# Patient Record
Sex: Male | Born: 1945 | Race: White | Hispanic: No | Marital: Married | Smoking: Current every day smoker
Health system: Southern US, Community
[De-identification: ages and names within clinical notes are randomized; demographics above are authoritative.]

## PROBLEM LIST (undated history)

## (undated) DIAGNOSIS — J189 Pneumonia, unspecified organism: Secondary | ICD-10-CM

## (undated) DIAGNOSIS — I739 Peripheral vascular disease, unspecified: Secondary | ICD-10-CM

## (undated) DIAGNOSIS — E785 Hyperlipidemia, unspecified: Secondary | ICD-10-CM

## (undated) DIAGNOSIS — E1159 Type 2 diabetes mellitus with other circulatory complications: Secondary | ICD-10-CM

## (undated) DIAGNOSIS — Z77098 Contact with and (suspected) exposure to other hazardous, chiefly nonmedicinal, chemicals: Secondary | ICD-10-CM

## (undated) DIAGNOSIS — G4700 Insomnia, unspecified: Secondary | ICD-10-CM

## (undated) DIAGNOSIS — F528 Other sexual dysfunction not due to a substance or known physiological condition: Secondary | ICD-10-CM

## (undated) DIAGNOSIS — F419 Anxiety disorder, unspecified: Secondary | ICD-10-CM

## (undated) DIAGNOSIS — I779 Disorder of arteries and arterioles, unspecified: Secondary | ICD-10-CM

## (undated) DIAGNOSIS — Z7739 Contact with and (suspected) exposure to other war theater: Secondary | ICD-10-CM

## (undated) DIAGNOSIS — I10 Essential (primary) hypertension: Secondary | ICD-10-CM

## (undated) DIAGNOSIS — I5022 Chronic systolic (congestive) heart failure: Secondary | ICD-10-CM

## (undated) DIAGNOSIS — I447 Left bundle-branch block, unspecified: Secondary | ICD-10-CM

## (undated) DIAGNOSIS — Z86010 Personal history of colonic polyps: Secondary | ICD-10-CM

## (undated) DIAGNOSIS — I25119 Atherosclerotic heart disease of native coronary artery with unspecified angina pectoris: Secondary | ICD-10-CM

## (undated) DIAGNOSIS — F4310 Post-traumatic stress disorder, unspecified: Secondary | ICD-10-CM

## (undated) HISTORY — DX: Personal history of colonic polyps: Z86.010

## (undated) HISTORY — PX: CAROTID ENDARTERECTOMY: SUR193

## (undated) HISTORY — DX: Other sexual dysfunction not due to a substance or known physiological condition: F52.8

## (undated) HISTORY — PX: ABDOMINAL AORTIC ANEURYSM REPAIR: SUR1152

## (undated) HISTORY — DX: Post-traumatic stress disorder, unspecified: F43.10

---

## 1998-12-08 ENCOUNTER — Encounter: Payer: Self-pay | Admitting: Internal Medicine

## 1998-12-08 ENCOUNTER — Ambulatory Visit (HOSPITAL_COMMUNITY): Admission: RE | Admit: 1998-12-08 | Discharge: 1998-12-08 | Payer: Self-pay | Admitting: Internal Medicine

## 2004-10-27 ENCOUNTER — Ambulatory Visit: Payer: Self-pay | Admitting: Internal Medicine

## 2005-11-22 HISTORY — PX: CORONARY STENT PLACEMENT: SHX1402

## 2005-12-17 ENCOUNTER — Ambulatory Visit: Payer: Self-pay | Admitting: Internal Medicine

## 2007-01-26 ENCOUNTER — Ambulatory Visit: Payer: Self-pay | Admitting: Internal Medicine

## 2007-08-15 ENCOUNTER — Encounter: Payer: Self-pay | Admitting: *Deleted

## 2007-08-15 DIAGNOSIS — F528 Other sexual dysfunction not due to a substance or known physiological condition: Secondary | ICD-10-CM | POA: Insufficient documentation

## 2007-08-15 DIAGNOSIS — I10 Essential (primary) hypertension: Secondary | ICD-10-CM | POA: Insufficient documentation

## 2007-08-15 DIAGNOSIS — Z9189 Other specified personal risk factors, not elsewhere classified: Secondary | ICD-10-CM

## 2007-08-15 HISTORY — DX: Other sexual dysfunction not due to a substance or known physiological condition: F52.8

## 2007-11-23 HISTORY — PX: PROSTATE BIOPSY: SHX241

## 2008-02-23 ENCOUNTER — Ambulatory Visit: Payer: Self-pay | Admitting: Internal Medicine

## 2008-02-23 DIAGNOSIS — I739 Peripheral vascular disease, unspecified: Secondary | ICD-10-CM | POA: Insufficient documentation

## 2008-02-23 DIAGNOSIS — Q245 Malformation of coronary vessels: Secondary | ICD-10-CM | POA: Insufficient documentation

## 2008-02-23 DIAGNOSIS — G4700 Insomnia, unspecified: Secondary | ICD-10-CM | POA: Insufficient documentation

## 2008-02-23 DIAGNOSIS — E785 Hyperlipidemia, unspecified: Secondary | ICD-10-CM

## 2008-02-23 DIAGNOSIS — I25118 Atherosclerotic heart disease of native coronary artery with other forms of angina pectoris: Secondary | ICD-10-CM | POA: Insufficient documentation

## 2008-02-23 DIAGNOSIS — I2510 Atherosclerotic heart disease of native coronary artery without angina pectoris: Secondary | ICD-10-CM

## 2008-02-23 DIAGNOSIS — I25119 Atherosclerotic heart disease of native coronary artery with unspecified angina pectoris: Secondary | ICD-10-CM

## 2008-02-23 HISTORY — DX: Atherosclerotic heart disease of native coronary artery with unspecified angina pectoris: I25.119

## 2009-02-28 ENCOUNTER — Ambulatory Visit: Payer: Self-pay | Admitting: Internal Medicine

## 2009-08-19 ENCOUNTER — Telehealth: Payer: Self-pay | Admitting: Internal Medicine

## 2009-08-22 ENCOUNTER — Ambulatory Visit: Payer: Self-pay | Admitting: Internal Medicine

## 2009-08-23 DIAGNOSIS — F411 Generalized anxiety disorder: Secondary | ICD-10-CM | POA: Insufficient documentation

## 2010-02-09 ENCOUNTER — Telehealth: Payer: Self-pay | Admitting: Internal Medicine

## 2010-07-30 ENCOUNTER — Ambulatory Visit: Payer: Self-pay | Admitting: Internal Medicine

## 2010-07-30 DIAGNOSIS — F4310 Post-traumatic stress disorder, unspecified: Secondary | ICD-10-CM | POA: Insufficient documentation

## 2010-07-30 HISTORY — DX: Post-traumatic stress disorder, unspecified: F43.10

## 2010-08-28 ENCOUNTER — Encounter: Payer: Self-pay | Admitting: Internal Medicine

## 2010-12-22 NOTE — Assessment & Plan Note (Signed)
Summary: f/u appt/#/cd   Vital Signs:  Patient profile:   65 year old male Height:      72 inches Weight:      179 pounds BMI:     24.36 O2 Sat:      96 % on Room air Temp:     98.2 degrees F oral Pulse rate:   66 / minute BP sitting:   130 / 72  (left arm) Cuff size:   regular  Vitals Entered By: Zella Ball Ewing CMA Duncan Dull) (July 30, 2010 1:53 PM)  O2 Flow:  Room air  Preventive Care Screening  Last Flu Shot:    Date:  07/27/2010    Results:  given   CC: followup/RE   CC:  followup/RE.  History of Present Illness: here to f/u;  has been approved for disablity, still working until he gets his first check;  needs med refills;  dneies worsening depressive symptoms or panic. still with chronic insomnia and mult meds have not worked for him in the past, cannot get the lorazepam at the [place], so is here for that if possible as this has worked in the past.  Pt denies CP, worsening sob, doe, wheezing, orthopnea, pnd, worsening LE edema, palps, dizziness or syncope  Pt denies new neuro symptoms such as headache, facial or extremity weakness  No fever, wt loss, night sweats, loss of appetite or other constitutional symptoms   Preventive Screening-Counseling & Management      Drug Use:  no.    Problems Prior to Update: 1)  Ptsd  (ICD-309.81) 2)  Anxiety  (ICD-300.00) 3)  Hyperlipidemia  (ICD-272.4) 4)  Peripheral Vascular Disease  (ICD-443.9) 5)  Coronary Artery Disease  (ICD-414.00) 6)  Insomnia-sleep Disorder-unspec  (ICD-780.52) 7)  Hypertension  (ICD-401.9) 8)  Erectile Dysfunction  (ICD-302.72) 9)  Insomnia, Hx of  (ICD-V15.89)  Medications Prior to Update: 1)  Simvastatin 20 Mg Tabs (Simvastatin) .Marland Kitchen.. 1po Once Daily 2)  Metoprolol Tartrate 50 Mg Tabs (Metoprolol Tartrate) .... 1/2  By Mouth Once Daily 3)  Lorazepam 1 Mg  Tabs (Lorazepam) .... 2 By Mouth At Bedtime As Needed 4)  Adult Aspirin Ec Low Strength 81 Mg Tbec (Aspirin) .Marland Kitchen.. 1po Once Daily 5)  Lisinopril 10 Mg  Tabs (Lisinopril) .Marland Kitchen.. 1 By Mouth Once Daily  Current Medications (verified): 1)  Simvastatin 20 Mg Tabs (Simvastatin) .Marland Kitchen.. 1po Once Daily 2)  Metoprolol Tartrate 50 Mg Tabs (Metoprolol Tartrate) .... 1/2  By Mouth Once Daily 3)  Lorazepam 1 Mg  Tabs (Lorazepam) .... 2 By Mouth At Bedtime As Needed 4)  Adult Aspirin Ec Low Strength 81 Mg Tbec (Aspirin) .Marland Kitchen.. 1po Once Daily 5)  Lisinopril 10 Mg Tabs (Lisinopril) .Marland Kitchen.. 1 By Mouth Once Daily  Allergies (verified): 1)  ! 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 Coronary artery disease Peripheral vascular disease - carotid 100 % right, s/p CEA on the left Hyperlipidemia Anxiety PTSD  Past Surgical History: s/p coronary stent 1/07 s/p left CEA - at [facility] in [place] s/p prostate biopsy at [facility] 2009  Social History: Reviewed history from 08/22/2009 and no changes required. 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   Head:  normocephalic and atraumatic.   Eyes:  vision grossly intact, pupils equal, and pupils round.   Ears:  R ear normal and L ear normal.   Nose:  no external deformity and no nasal discharge.   Mouth:  no gingival abnormalities and pharynx pink and moist.   Neck:  supple and no masses.   Lungs:  normal respiratory effort and normal breath sounds.   Heart:  normal rate and regular rhythm.   Extremities:  no edema, no erythema  Psych:  moderately anxious.  not depressed appearing.     Impression & Recommendations:  Problem # 1:   INSOMNIA-SLEEP DISORDER-UNSPEC (ICD-780.52) to cont the lorazepam qhs  Problem # 2:  PTSD (ICD-309.81)  with anxiety - pt plans to f/u at [facility]  Problem # 3:  HYPERTENSION (ICD-401.9)  His updated medication list for this problem includes:    Metoprolol Tartrate 50 Mg Tabs (Metoprolol tartrate) .Marland Kitchen... 1/2  by mouth once daily    Lisinopril 10 Mg Tabs (Lisinopril) .Marland Kitchen... 1 by mouth once daily  BP today: 130/72 Prior BP: 110/70 (08/22/2009) stable overall by hx and exam, ok to continue meds/tx as is   Complete Medication List: 1)  Simvastatin 20 Mg Tabs (Simvastatin) .Marland Kitchen.. 1po once daily 2)  Metoprolol Tartrate 50 Mg Tabs (Metoprolol tartrate) .... 1/2  by mouth once daily 3)  Lorazepam 1 Mg Tabs (Lorazepam) .... 2 by mouth at bedtime as needed 4)  Adult Aspirin Ec Low Strength 81 Mg Tbec (Aspirin) .Marland Kitchen.. 1po once daily 5)  Lisinopril 10 Mg Tabs (Lisinopril) .Marland Kitchen.. 1 by mouth once daily  Patient Instructions: 1)  Continue all previous medications as before this visit 2)  Please schedule a follow-up appointment in 6 months for your "yearly medicare exam" Prescriptions: LORAZEPAM 1 MG  TABS (LORAZEPAM) 2 by mouth at bedtime as needed  #60 x 5   Entered and Authorized by:   Corwin Levins MD   Signed by:   Corwin Levins MD on 07/30/2010   Method used:   Print then Give to Patient   RxID:   [id number]

## 2010-12-22 NOTE — Progress Notes (Signed)
Summary: med refills  Phone Note Refill Request  on February 09, 2010 11:11 AM  Refills Requested: Medication #1:  LORAZEPAM 1 MG  TABS 2 by mouth at bedtime as needed   Dosage confirmed as above?Dosage Confirmed   Notes: [facility] [phone] Initial call taken by: Scharlene Gloss,  February 09, 2010 11:12 AM  Follow-up for Phone Call        faxed. Follow-up by: Lucious Groves,  February 09, 2010 2:22 PM    New/Updated Medications: LORAZEPAM 1 MG  TABS (LORAZEPAM) 2 by mouth at bedtime as needed Prescriptions: LORAZEPAM 1 MG  TABS (LORAZEPAM) 2 by mouth at bedtime as needed  #60 x 5   Entered and Authorized by:   Corwin Levins MD   Signed by:   Corwin Levins MD on 02/09/2010   Method used:   Print then Give to Patient   RxID:   [id number]  done hardcopy to [facility] side B - dahlia  Corwin Levins MD  February 09, 2010 1:21 PM

## 2011-01-19 ENCOUNTER — Telehealth: Payer: Self-pay | Admitting: Internal Medicine

## 2011-01-28 NOTE — Progress Notes (Signed)
Summary: medication refill  Phone Note Refill Request Message from:  Fax from Pharmacy on January 19, 2011 2:20 PM  Refills Requested: Medication #1:  LORAZEPAM 1 MG  TABS 2 by mouth at bedtime as needed   Dosage confirmed as above?Dosage Confirmed   Last Refilled: 07/30/2010   Notes: [facility], [phone] Initial call taken by: Robin Ewing CMA Duncan Dull),  January 19, 2011 2:21 PM  Follow-up for Phone Call        faxed hardcopy to pharmacy Follow-up by: Robin Ewing CMA Duncan Dull),  January 19, 2011 2:47 PM    New/Updated Medications: LORAZEPAM 1 MG  TABS (LORAZEPAM) 2 by mouth at bedtime as needed Prescriptions: LORAZEPAM 1 MG  TABS (LORAZEPAM) 2 by mouth at bedtime as needed  #60 x 5   Entered and Authorized by:   Corwin Levins MD   Signed by:   Corwin Levins MD on 01/19/2011   Method used:   Print then Give to Patient   RxID:   [id number]  done hardcopy to [facility] side B - dahlia Corwin Levins MD  January 19, 2011 2:39 PM

## 2011-01-29 ENCOUNTER — Encounter: Payer: Self-pay | Admitting: Internal Medicine

## 2011-01-29 ENCOUNTER — Encounter (INDEPENDENT_AMBULATORY_CARE_PROVIDER_SITE_OTHER): Payer: Self-pay | Admitting: Internal Medicine

## 2011-01-29 DIAGNOSIS — F411 Generalized anxiety disorder: Secondary | ICD-10-CM

## 2011-01-29 DIAGNOSIS — Z0000 Encounter for general adult medical examination without abnormal findings: Secondary | ICD-10-CM

## 2011-01-29 DIAGNOSIS — Z860100 Personal history of colon polyps, unspecified: Secondary | ICD-10-CM

## 2011-01-29 DIAGNOSIS — E119 Type 2 diabetes mellitus without complications: Secondary | ICD-10-CM

## 2011-01-29 DIAGNOSIS — E1151 Type 2 diabetes mellitus with diabetic peripheral angiopathy without gangrene: Secondary | ICD-10-CM | POA: Insufficient documentation

## 2011-01-29 DIAGNOSIS — E785 Hyperlipidemia, unspecified: Secondary | ICD-10-CM

## 2011-01-29 DIAGNOSIS — I2510 Atherosclerotic heart disease of native coronary artery without angina pectoris: Secondary | ICD-10-CM

## 2011-01-29 DIAGNOSIS — G4700 Insomnia, unspecified: Secondary | ICD-10-CM

## 2011-01-29 HISTORY — DX: Personal history of colon polyps, unspecified: Z86.0100

## 2011-01-29 HISTORY — DX: Personal history of colonic polyps: Z86.010

## 2011-02-02 NOTE — Assessment & Plan Note (Signed)
Summary: YEARLY FU MEDICARE ASKED [facility]  NWS#   Vital Signs:  Patient profile:   65 year old male Height:      72 inches Weight:      178.50 pounds BMI:     24.30 O2 Sat:      99 % on Room air Temp:     98 degrees F oral Pulse rate:   64 / minute BP sitting:   134 / 66  (left arm) Cuff size:   regular  Vitals Entered By: Zella Ball Ewing CMA Duncan Dull) (January 29, 2011 8:34 AM)  O2 Flow:  Room air  CC: Physical/RE   CC:  Physical/RE.  History of Present Illness: here to f/u - overall doing ok; normally has most healthcare at the [facility] for chronic purposes;  sees me primarily for insomnia;  Denies worsening depressive symptoms, suicidal ideation, or panic, though has ongoing anxiety.  [facility]  - as oct 2011 6.8 a1c, and LDL 115 - no med changes per [place] MD and pt wants to cont management with him, knowing todya his goal LDL is < 70; Pt denies CP, worsening sob, doe, wheezing, orthopnea, pnd, worsening LE edema, palps, dizziness or syncope Pt denies new neuro symptoms such as headache, facial or extremity weakness  Pt denies polydipsia, polyuria, or low sugar symptoms such as shakiness improved with eating.  Overall good compliance with meds, trying to follow low chol, DM diet, wt stable, little excercise however Does not check BG's but plans to start soon with the [facility] meter he is to get soon.  No fever, wt loss, night sweats, loss of appetite or other constitutional symptoms Overall good compliance with meds, and good tolerability.  Gets PSA twice per yr at [place] as well - no result here. Had colonscopy - neg per pt, but had "spots" taken off that turned out to be nothing.  Last colonscopy actually done at [facility].   Problems Prior to Update: 1)  Colonic Polyps, Hx of  (ICD-V12.72) 2)  Diabetes Mellitus, Type II  (ICD-250.00) 3)  Preventive Health Care  (ICD-V70.0) 4)  Ptsd  (ICD-309.81) 5)  Anxiety  (ICD-300.00) 6)  Hyperlipidemia  (ICD-272.4) 7)  Peripheral  Vascular Disease  (ICD-443.9) 8)  Coronary Artery Disease  (ICD-414.00) 9)  Insomnia-sleep Disorder-unspec  (ICD-780.52) 10)  Hypertension  (ICD-401.9) 11)  Erectile Dysfunction  (ICD-302.72) 12)  Insomnia, Hx of  (ICD-V15.89)  Medications Prior to Update: 1)  Simvastatin 20 Mg Tabs (Simvastatin) .Marland Kitchen.. 1po Once Daily 2)  Metoprolol Tartrate 50 Mg Tabs (Metoprolol Tartrate) .... 1/2  By Mouth Once Daily 3)  Lorazepam 1 Mg  Tabs (Lorazepam) .... 2 By Mouth At Bedtime As Needed 4)  Adult Aspirin Ec Low Strength 81 Mg Tbec (Aspirin) .Marland Kitchen.. 1po Once Daily 5)  Lisinopril 10 Mg Tabs (Lisinopril) .Marland Kitchen.. 1 By Mouth Once Daily  Current Medications (verified): 1)  Simvastatin 20 Mg Tabs (Simvastatin) .Marland Kitchen.. 1po Once Daily 2)  Metoprolol Tartrate 50 Mg Tabs (Metoprolol Tartrate) .... 1/2  By Mouth Once Daily 3)  Lorazepam 1 Mg  Tabs (Lorazepam) .... 2 By Mouth At Bedtime As Needed 4)  Adult Aspirin Ec Low Strength 81 Mg Tbec (Aspirin) .Marland Kitchen.. 1po Once Daily 5)  Lisinopril 10 Mg Tabs (Lisinopril) .Marland Kitchen.. 1 By Mouth Once Daily 6)  Lexapro 10 Mg Tabs (Escitalopram Oxalate) .Marland Kitchen.. 1 By Mouth Once Daily  Allergies (verified): 1)  ! 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 Coronary artery disease Peripheral vascular disease - carotid 100 % right, s/p CEA on the left Hyperlipidemia Anxiety PTSD Diabetes mellitus, type II - diet Colonic polyps, hx of  Review of Systems       all otherwise negative per pt -    Physical Exam  General:  alert and well-developed.   Head:  normocephalic  and atraumatic.   Eyes:  vision grossly intact, pupils equal, and pupils round.   Ears:  R ear normal and L ear normal.   Nose:  no external deformity and no nasal discharge.   Mouth:  no gingival abnormalities and pharynx pink and moist.   Neck:  supple and no masses.   Lungs:  normal respiratory effort and normal breath sounds.   Heart:  normal rate and regular rhythm.   Abdomen:  soft, non-tender, and normal bowel sounds.   Msk:  no joint tenderness and no joint swelling.   Extremities:  no edema, no erythema  Neurologic:  cranial nerves II-XII intact and strength normal in all extremities.   Skin:  color normal and no rashes.   Psych:  not depressed appearing and moderately anxious.     Impression & Recommendations:  Problem # 1:  CORONARY ARTERY DISEASE (ICD-414.00)  His updated medication list for this problem includes:    Metoprolol Tartrate 50 Mg Tabs (Metoprolol tartrate) .Marland Kitchen... 1/2  by mouth once daily    Adult Aspirin Ec Low Strength 81 Mg Tbec (Aspirin) .Marland Kitchen... 1po once daily    Lisinopril 10 Mg Tabs (Lisinopril) .Marland Kitchen... 1 by mouth once daily  Orders: EKG w/ Interpretation (93000) stable overall by hx and exam, ok to continue meds/tx as is   Problem # 2:  DIABETES MELLITUS, TYPE II (ICD-250.00)  His updated medication list for this problem includes:    Adult Aspirin Ec Low Strength 81 Mg Tbec (Aspirin) .Marland Kitchen... 1po once daily    Lisinopril 10 Mg Tabs (Lisinopril) .Marland Kitchen... 1 by mouth once daily for diet to cont for now; pt [facility], Pt to cont DM diet, excercise, wt control efforts; to check labs next visit possbily but he declines today  Problem # 3:  HYPERLIPIDEMIA (ICD-272.4)  His updated medication list for this problem includes:    Simvastatin 20 Mg Tabs (Simvastatin) .Marland Kitchen... 1po once daily most recnet LDL 115 per [facility] labs - not at goal, I stressed to pt and he again plans to f[facility] with [facility]  Problem # 4:  INSOMNIA-SLEEP DISORDER-UNSPEC (ICD-780.52) stable  overall by hx and exam, ok to continue meds/tx as is - benzo refilled  Problem # 5:  ANXIETY (ICD-300.00)  His updated medication list for this problem includes:    Lorazepam 1 Mg Tabs (Lorazepam) .Marland Kitchen... 2 by mouth at bedtime as needed    Lexapro 10 Mg Tabs (Escitalopram oxalate) .Marland Kitchen... 1 by mouth once daily treat as above, f/u any worsening signs or symptoms   Discussed medication use and relaxation techniques.   Complete Medication List: 1)  Simvastatin 20 Mg Tabs (Simvastatin) .Marland Kitchen.. 1po once daily 2)  Metoprolol Tartrate 50 Mg Tabs (Metoprolol tartrate) .Marland KitchenMarland KitchenMarland Kitchen  1/2  by mouth once daily 3)  Lorazepam 1 Mg Tabs (Lorazepam) .... 2 by mouth at bedtime as needed 4)  Adult Aspirin Ec Low Strength 81 Mg Tbec (Aspirin) .Marland Kitchen.. 1po once daily 5)  Lisinopril 10 Mg Tabs (Lisinopril) .Marland Kitchen.. 1 by mouth once daily 6)  Lexapro 10 Mg Tabs (Escitalopram oxalate) .Marland Kitchen.. 1 by mouth once daily  Patient Instructions: 1)  Please inform the [facility] physician you have Diabetes based on the most recent lab work;  also the recommended LDL goal is less than 70 with your history of blood vessel blockages 2)  Please also let your [facility] physician know about the treatment for your nerves with the Lexapro, which is arguably your biggest problem, and for which you have benefits at the [place] 3)  Continue all previous medications as before this visit  4)  Please schedule a follow-up appointment in 1 year, or sooner if needed Prescriptions: LEXAPRO 10 MG TABS (ESCITALOPRAM OXALATE) 1 by mouth once daily  #90 x 3   Entered and Authorized by:   Corwin Levins MD   Signed by:   Corwin Levins MD on 01/29/2011   Method used:   Print then Give to Patient   RxID:   [id number]    Orders Added: 1)  EKG w/ Interpretation [93000] 2)  EKG w/ Interpretation [93000] 3)  Est. Patient Level IV [10932]

## 2011-07-01 ENCOUNTER — Other Ambulatory Visit: Payer: Self-pay

## 2011-07-01 MED ORDER — LORAZEPAM 1 MG PO TABS: ORAL_TABLET | ORAL | Status: DC

## 2011-07-01 NOTE — Telephone Encounter (Signed)
Faxed hardcopy of prescription to [facility] [place] [phone]

## 2011-11-23 HISTORY — PX: CORONARY ARTERY BYPASS GRAFT: SHX141

## 2011-12-14 ENCOUNTER — Other Ambulatory Visit: Payer: Self-pay | Admitting: Internal Medicine

## 2011-12-14 NOTE — Telephone Encounter (Signed)
Faxed hardcopy to pharmacy. 

## 2012-01-04 ENCOUNTER — Telehealth: Payer: Self-pay

## 2012-01-04 MED ORDER — LORAZEPAM 1 MG PO TABS: 1.0000 mg | ORAL_TABLET | Freq: Two times a day (BID) | ORAL | Status: DC | PRN

## 2012-01-04 NOTE — Telephone Encounter (Signed)
Done hardcopy to robin  

## 2012-01-04 NOTE — Telephone Encounter (Signed)
Faxed hardcopy to pharmacy and informed the patients wife.

## 2012-01-04 NOTE — Telephone Encounter (Signed)
Patients wife called to inform Dr. Everardo All refill his lorazepam on 12/14/2011, but only sent in #30 (should have been #60).  The patient is now completely out. Please advise call back number is [phone].

## 2012-02-28 ENCOUNTER — Encounter: Payer: Self-pay | Admitting: Internal Medicine

## 2012-02-28 ENCOUNTER — Other Ambulatory Visit: Payer: Self-pay | Admitting: Internal Medicine

## 2012-02-28 DIAGNOSIS — Z0000 Encounter for general adult medical examination without abnormal findings: Secondary | ICD-10-CM | POA: Insufficient documentation

## 2012-02-28 DIAGNOSIS — Z23 Encounter for immunization: Secondary | ICD-10-CM | POA: Insufficient documentation

## 2012-02-28 NOTE — Telephone Encounter (Signed)
Done hardcopy to robin  

## 2012-02-28 NOTE — Telephone Encounter (Signed)
Faxed hardcopy to pharmacy. 

## 2012-03-03 ENCOUNTER — Ambulatory Visit (INDEPENDENT_AMBULATORY_CARE_PROVIDER_SITE_OTHER): Payer: Self-pay | Admitting: Internal Medicine

## 2012-03-03 VITALS — BP 162/82 | HR 57 | Temp 98.1°F | Ht 72.0 in | Wt 176.5 lb

## 2012-03-03 DIAGNOSIS — I10 Essential (primary) hypertension: Secondary | ICD-10-CM

## 2012-03-03 DIAGNOSIS — F4310 Post-traumatic stress disorder, unspecified: Secondary | ICD-10-CM

## 2012-03-03 MED ORDER — LORAZEPAM 1 MG PO TABS: 1.0000 mg | ORAL_TABLET | Freq: Two times a day (BID) | ORAL | Status: DC | PRN

## 2012-03-03 NOTE — Patient Instructions (Signed)
Continue all other medications as before Please keep your appointments with your specialists as you have planned - [facility] as [facility] do Please return in 1 year for your yearly visit, or sooner if needed

## 2012-03-04 ENCOUNTER — Encounter: Payer: Self-pay | Admitting: Internal Medicine

## 2012-03-04 MED ORDER — ASPIRIN 81 MG PO TBEC: 81.0000 mg | DELAYED_RELEASE_TABLET | Freq: Every day | ORAL | Status: AC

## 2012-03-04 MED ORDER — METOPROLOL TARTRATE 25 MG PO TABS: ORAL_TABLET | ORAL | Status: DC

## 2012-03-04 NOTE — Progress Notes (Addendum)
Subjective:    Patient ID: Jesse Hansen, male    DOB: 08/28/1946, 66 y.o.   MRN: [id number]  HPI  Here for f/u; has most of basic care at [facility] where he recently ran out of beta blocker for 3 days, had CP thought c/w angina but resolved with med restart;  Med list unclear but is taking a statin, metoprolol 25  - 1/2 bid, another "BP med" twice daily, and asa 81.  Denies worsening depressive symptoms, suicidal ideation, or panic, though has ongoing anxiety, well controlled on current med.  Pt denies chest pain, increased sob or doe, wheezing, orthopnea, PND, increased LE swelling, palpitations, dizziness or syncope.  Pt denies new neurological symptoms such as new headache, or facial or extremity weakness or numbness   Pt denies polydipsia, polyuria,   Pt states overall good compliance with meds, trying to follow lower cholesterol, diabetic diet, wt overall stable.   Pt denies fever, wt loss, night sweats, loss of appetite, or other constitutional symptoms.  BP has been on lower side, infact one of his meds recently decreased.  Has stress test scheduled soon at [place], no further CP since back on beta blocker Past Medical History  Diagnosis Date  . CORONARY ARTERY DISEASE 02/23/2008    Qualifier: Diagnosis of  By: Jonny Ruiz MD, Len Blalock   . PTSD 07/30/2010    Qualifier: Diagnosis of  By: Jonny Ruiz MD, Len Blalock   . PERIPHERAL VASCULAR DISEASE 02/23/2008    Qualifier: Diagnosis of  By: Jonny Ruiz MD, Len Blalock   . HYPERTENSION 08/15/2007    Qualifier: Diagnosis of  By: Dance CMA (AAMA), Kim    . HYPERLIPIDEMIA 02/23/2008    Qualifier: Diagnosis of  By: Jonny Ruiz MD, Len Blalock   . DIABETES MELLITUS, TYPE II 01/29/2011    Qualifier: Diagnosis of  By: Jonny Ruiz MD, Len Blalock   . ANXIETY 08/23/2009    Qualifier: Diagnosis of  By: Jonny Ruiz MD, Len Blalock INSOMNIA-SLEEP DISORDER-UNSPEC 02/23/2008    Qualifier: Diagnosis of  By: Jonny Ruiz MD, Len Blalock   . ERECTILE DYSFUNCTION 08/15/2007    Qualifier: Diagnosis of  By: Dance CMA (AAMA), Kim    . COLONIC POLYPS, HX  OF 01/29/2011    Qualifier: Diagnosis of  By: Jonny Ruiz MD, Len Blalock    Past Surgical History  Procedure Date  . Carotid endarterectomy   . Coronary stent placement 2007  . Prostate biopsy 2009    reports that he has been smoking.  He has never used smokeless tobacco. He reports that he drinks alcohol. He reports that he does not use illicit drugs. family history includes Hypertension in his father. Allergies  Allergen Reactions  . Codeine    No current outpatient prescriptions on file prior to visit.   Review of Systems Review of Systems  Constitutional: Negative for diaphoresis and unexpected weight change.  Gastrointestinal: Negative for vomiting and blood in stool.  Genitourinary: Negative for hematuria and decreased urine volume.  Musculoskeletal: Negative for gait problem.  Skin: Negative for color change and wound.  Neurological: Negative for tremors and numbness.  Psychiatric/Behavioral: Negative for decreased concentration. The patient is not hyperactive.       Objective:   Physical Exam BP 162/82  Pulse 57  Temp(Src) 98.1 F (36.7 C) (Oral)  Ht 6' (1.829 m)  Wt 176 lb 8 oz (80.06 kg)  BMI 23.94 kg/m2  SpO2 98% Physical Exam  VS noted Constitutional: Pt appears well-developed and well-nourished.  HENT: Head: Normocephalic.  Right Ear: External ear normal.  Left Ear: External ear normal.  Eyes: Conjunctivae and EOM are normal. Pupils are equal, round, and reactive to light.  Neck: Normal range of motion. Neck supple.  Cardiovascular: Normal rate and regular rhythm.   Pulmonary/Chest: Effort normal and breath sounds normal.  Abd:  Soft, NT, non-distended, + BS Neurological: Pt is alert. No cranial nerve deficit.  Skin: Skin is warm. No erythema.  Psychiatric: Pt behavior is normal. Thought content normal. 1+ nervous    Assessment & Plan:

## 2012-03-04 NOTE — Progress Notes (Deleted)
  Subjective:    Patient ID: Jesse Hansen, male    DOB: 01-Nov-1946, 66 y.o.   MRN: [id number]  HPI    Review of Systems     Objective:   Physical Exam        Assessment & Plan:

## 2012-03-04 NOTE — Assessment & Plan Note (Signed)
stable overall by hx and exam, and pt to continue medical treatment as before, for refill today 

## 2012-03-04 NOTE — Assessment & Plan Note (Signed)
Overall control unclear - pt declines any change at this time, o/w stable, to f/u at [place] as he does

## 2012-09-04 ENCOUNTER — Other Ambulatory Visit: Payer: Self-pay | Admitting: Internal Medicine

## 2012-09-04 NOTE — Telephone Encounter (Signed)
Done hardcopy to robin  

## 2012-09-05 NOTE — Telephone Encounter (Signed)
Faxed hardcopy to pharmacy. 

## 2013-02-20 ENCOUNTER — Other Ambulatory Visit: Payer: Self-pay | Admitting: Internal Medicine

## 2013-02-20 NOTE — Telephone Encounter (Signed)
Done hardcopy to robin  Due for OV please

## 2013-02-20 NOTE — Telephone Encounter (Signed)
refill 

## 2013-02-21 NOTE — Telephone Encounter (Signed)
Faxed hardcopy to [facility]. Called the patient to inform to schedule OV no answer.

## 2013-03-02 ENCOUNTER — Encounter: Payer: Self-pay | Admitting: Internal Medicine

## 2013-03-02 ENCOUNTER — Ambulatory Visit (INDEPENDENT_AMBULATORY_CARE_PROVIDER_SITE_OTHER): Payer: Self-pay | Admitting: Internal Medicine

## 2013-03-02 VITALS — BP 128/70 | HR 49 | Temp 97.0°F | Ht 72.0 in | Wt 168.2 lb

## 2013-03-02 DIAGNOSIS — I10 Essential (primary) hypertension: Secondary | ICD-10-CM

## 2013-03-02 DIAGNOSIS — I2510 Atherosclerotic heart disease of native coronary artery without angina pectoris: Secondary | ICD-10-CM

## 2013-03-02 DIAGNOSIS — E119 Type 2 diabetes mellitus without complications: Secondary | ICD-10-CM

## 2013-03-02 DIAGNOSIS — G4700 Insomnia, unspecified: Secondary | ICD-10-CM

## 2013-03-02 MED ORDER — LORAZEPAM 1 MG PO TABS: ORAL_TABLET | ORAL | Status: DC

## 2013-03-02 NOTE — Assessment & Plan Note (Signed)
stable overall by history and exam, recent data reviewed with pt, and pt to continue medical treatment as before,  to f/u any worsening symptoms or concerns BP Readings from Last 3 Encounters:  03/02/13 128/70  03/03/12 162/82  01/29/11 134/66

## 2013-03-02 NOTE — Assessment & Plan Note (Signed)
stable overall by history and exam, recent data reviewed with pt, and pt to continue medical treatment as before,  to f/u any worsening symptoms or concerns  

## 2013-03-02 NOTE — Assessment & Plan Note (Signed)
stable overall by history and exam,  and pt to continue medical treatment as before,  to f/u any worsening symptoms or concerns, last labs done at [place], asked pt to forward labs to [place], declines further labs today

## 2013-03-02 NOTE — Assessment & Plan Note (Signed)
For lorazepam chronic tx, stable for many yrs

## 2013-03-02 NOTE — Progress Notes (Signed)
Subjective:    Patient ID: Jesse Hansen, male    DOB: 1946/02/06, 67 y.o.   MRN: [id number]  HPI  Here to f/u; overall doing ok,  Pt denies chest pain, increased sob or doe, wheezing, orthopnea, PND, increased LE swelling, palpitations, dizziness or syncope.  Pt denies polydipsia, polyuria, or low sugar symptoms such as weakness or confusion improved with po intake.  Pt denies new neurological symptoms such as new headache, or facial or extremity weakness or numbness.   Pt states overall good compliance with meds, has been trying to follow lower cholesterol, diabetic diet, with wt overall stable,  but little exercise however. Denies worsening depressive symptoms, suicidal ideation, or panic; has ongoing anxiety, with chronic insomnia, needs lorazepam refills, sometimes works out of town so needs separate monthly rx to fill if out of town Past Medical History  Diagnosis Date  . CORONARY ARTERY DISEASE 02/23/2008    Qualifier: Diagnosis of  By: Jonny Ruiz MD, Len Blalock   . PTSD 07/30/2010    Qualifier: Diagnosis of  By: Jonny Ruiz MD, Len Blalock   . PERIPHERAL VASCULAR DISEASE 02/23/2008    Qualifier: Diagnosis of  By: Jonny Ruiz MD, Len Blalock   . HYPERTENSION 08/15/2007    Qualifier: Diagnosis of  By: Dance CMA (AAMA), Kim    . HYPERLIPIDEMIA 02/23/2008    Qualifier: Diagnosis of  By: Jonny Ruiz MD, Len Blalock   . DIABETES MELLITUS, TYPE II 01/29/2011    Qualifier: Diagnosis of  By: Jonny Ruiz MD, Len Blalock   . ANXIETY 08/23/2009    Qualifier: Diagnosis of  By: Jonny Ruiz MD, Len Blalock INSOMNIA-SLEEP DISORDER-UNSPEC 02/23/2008    Qualifier: Diagnosis of  By: Jonny Ruiz MD, Len Blalock   . ERECTILE DYSFUNCTION 08/15/2007    Qualifier: Diagnosis of  By: Dance CMA (AAMA), Kim    . COLONIC POLYPS, HX OF 01/29/2011    Qualifier: Diagnosis of  By: Jonny Ruiz MD, Len Blalock    Past Surgical History  Procedure Laterality Date  . Carotid endarterectomy    . Coronary stent placement  2007  . Prostate biopsy  2009    reports that he has been smoking.  He has never used  smokeless tobacco. He reports that  drinks alcohol. He reports that he does not use illicit drugs. family history includes Hypertension in his father. Allergies  Allergen Reactions  . Codeine    Current Outpatient Prescriptions on File Prior to Visit  Medication Sig Dispense Refill  . aspirin 81 MG EC tablet Take 1 tablet (81 mg total) by mouth daily. Swallow whole.  30 tablet  12  . metoprolol tartrate (LOPRESSOR) 25 MG tablet 1/2 tab by mouth twice per day  90 tablet  3   No current facility-administered medications on file prior to visit.   Review of Systems  Constitutional: Negative for unexpected weight change, or unusual diaphoresis  HENT: Negative for tinnitus.   Eyes: Negative for photophobia and visual disturbance.  Respiratory: Negative for choking and stridor.   Gastrointestinal: Negative for vomiting and blood in stool.  Genitourinary: Negative for hematuria and decreased urine volume.  Musculoskeletal: Negative for acute joint swelling Skin: Negative for color change and wound.  Neurological: Negative for tremors and numbness other than noted  Psychiatric/Behavioral: Negative for decreased concentration or  hyperactivity.       Objective:   Physical Exam BP 128/70  Pulse 49  Temp(Src) 97 F (36.1 C) (Oral)  Ht 6' (1.829 m)  Wt 168 lb 4 oz (  76.318 kg)  BMI 22.81 kg/m2  SpO2 94% VS noted,  Constitutional: Pt appears well-developed and well-nourished.  HENT: Head: NCAT.  Right Ear: External ear normal.  Left Ear: External ear normal.  Eyes: Conjunctivae and EOM are normal. Pupils are equal, round, and reactive to light.  Neck: Normal range of motion. Neck supple.  Cardiovascular: Normal rate and regular rhythm.   Pulmonary/Chest: Effort normal and breath sounds normal.  Abd:  Soft, NT, non-distended, + BS Neurological: Pt is alert. Not confused  Skin: Skin is warm. No erythema.  Psychiatric: Pt behavior is normal. Thought content normal. 1-2+ nervous     Assessment & Plan:

## 2013-03-02 NOTE — Patient Instructions (Addendum)
.  Please continue all other medications as before, and refills have been done if requested - the 6 hardcopy presctiptions that can used if you are out of town when they need to be filled Please call or message in 6 months when you need refills, and remember to specify separate monthly hardcopies Please keep your appointments with your specialists as you have planned - the [place] doctors Please remember to sign up for My Chart if you have not done so, as this will be important to you in the future with finding out test results, communicating by private email, and scheduling acute appointments online when needed.

## 2013-10-17 ENCOUNTER — Other Ambulatory Visit: Payer: Self-pay | Admitting: Internal Medicine

## 2013-10-17 NOTE — Telephone Encounter (Signed)
Faxed hardcopy to [facility]. 

## 2013-10-17 NOTE — Telephone Encounter (Signed)
Done hardcopy to robin  Robin to remind pt we normally see pt's with DM approx every 6 mo, so he would be due now (though I understand he does not have health insurance according to the computer)

## 2013-12-05 ENCOUNTER — Other Ambulatory Visit: Payer: Self-pay | Admitting: Internal Medicine

## 2013-12-05 NOTE — Telephone Encounter (Signed)
Done hardcopy to robin  

## 2013-12-05 NOTE — Telephone Encounter (Signed)
Faxed hardcopy to [facility]. 

## 2014-03-11 ENCOUNTER — Other Ambulatory Visit: Payer: Self-pay | Admitting: Internal Medicine

## 2014-03-11 NOTE — Telephone Encounter (Signed)
Faxed script back to [facility]...Johny Chess

## 2014-03-11 NOTE — Telephone Encounter (Signed)
Done hardcopy to robin  

## 2014-04-11 ENCOUNTER — Other Ambulatory Visit: Payer: Self-pay

## 2014-04-11 MED ORDER — LORAZEPAM 1 MG PO TABS: ORAL_TABLET | ORAL | Status: DC

## 2014-04-11 NOTE — Telephone Encounter (Signed)
Attempted to call this patient but both numbers in chart and not working.  Will let the the pharmacy know patient needs appointment.  Faxed hardcopy for Lorazepam 1 mg to [facility]

## 2014-04-11 NOTE — Telephone Encounter (Signed)
Done hardcopy to robin  Robin or nancy to let pt know - due for ROV

## 2014-05-09 ENCOUNTER — Telehealth: Payer: Self-pay

## 2014-05-09 MED ORDER — LORAZEPAM 1 MG PO TABS: ORAL_TABLET | ORAL | Status: DC

## 2014-05-09 NOTE — Telephone Encounter (Signed)
Refill for Lorazepam, patient has appt. On May 15, 2014.

## 2014-05-09 NOTE — Telephone Encounter (Signed)
Done hardcopy to robin  

## 2014-05-10 NOTE — Telephone Encounter (Signed)
Faxed hardcopy to [facility].

## 2014-05-15 ENCOUNTER — Telehealth: Payer: Self-pay | Admitting: Internal Medicine

## 2014-05-15 ENCOUNTER — Encounter: Payer: Self-pay | Admitting: Internal Medicine

## 2014-05-15 ENCOUNTER — Ambulatory Visit (INDEPENDENT_AMBULATORY_CARE_PROVIDER_SITE_OTHER): Payer: Self-pay | Admitting: Internal Medicine

## 2014-05-15 VITALS — BP 140/86 | HR 59 | Temp 97.6°F | Ht 72.0 in | Wt 183.0 lb

## 2014-05-15 DIAGNOSIS — F4310 Post-traumatic stress disorder, unspecified: Secondary | ICD-10-CM

## 2014-05-15 DIAGNOSIS — I10 Essential (primary) hypertension: Secondary | ICD-10-CM

## 2014-05-15 DIAGNOSIS — E119 Type 2 diabetes mellitus without complications: Secondary | ICD-10-CM

## 2014-05-15 DIAGNOSIS — E785 Hyperlipidemia, unspecified: Secondary | ICD-10-CM

## 2014-05-15 MED ORDER — LISINOPRIL 10 MG PO TABS: 10.0000 mg | ORAL_TABLET | Freq: Every day | ORAL | Status: DC

## 2014-05-15 MED ORDER — LORAZEPAM 1 MG PO TABS: ORAL_TABLET | ORAL | Status: DC

## 2014-05-15 MED ORDER — LOVASTATIN 20 MG PO TABS: 20.0000 mg | ORAL_TABLET | Freq: Every day | ORAL | Status: DC

## 2014-05-15 MED ORDER — ASPIRIN EC 81 MG PO TBEC: 81.0000 mg | DELAYED_RELEASE_TABLET | Freq: Every day | ORAL | Status: DC

## 2014-05-15 NOTE — Assessment & Plan Note (Signed)
[facility] for refill lorazepam prn,  to f/u any worsening symptoms or concerns

## 2014-05-15 NOTE — Assessment & Plan Note (Signed)
stable overall by history and exam, recent data reviewed with pt, and pt to continue medical treatment as before,  to f/u any worsening symptoms or concerns, gets most care at [facility] BP Readings from Last 3 Encounters:  05/15/14 140/86  03/02/13 128/70  03/03/12 162/82

## 2014-05-15 NOTE — Telephone Encounter (Signed)
Relevant patient education mailed to patient.  

## 2014-05-15 NOTE — Assessment & Plan Note (Signed)
stable overall by history and exam, recent data reviewed with pt, and pt to continue medical treatment as before,  to f/u any worsening symptoms or concerns, gets most care at [facility]

## 2014-05-15 NOTE — Progress Notes (Signed)
Subjective:    Patient ID: Jesse Hansen, male    DOB: Dec 01, 1945, 68 y.o.   MRN: [id number]  HPI    Here to f/u, had recent labs at [facility] with low vit d, now on replacement tx.  Denies worsening depressive symptoms, suicidal ideation, or panic; has ongoing anxiety, not increased recently. Asks for med refills, including lorazapem he has taken since [place], does not get this refill at [place], lets him be able to get to sleep at night as well.  Pt denies chest pain, increased sob or doe, wheezing, orthopnea, PND, increased LE swelling, palpitations, dizziness or syncope. Last a1c here on record 6.8 in 2012.  Sees cardiologist in [place] , with check on left CEA as well  Plans to have Prevnar next [place] visit. Takes asa every 3 days, also on blood thinner and DM pill but not sure of names.  Past Medical History  Diagnosis Date  . CORONARY ARTERY DISEASE 02/23/2008    Qualifier: Diagnosis of  By: Jenny Reichmann MD, Hunt Oris   . PTSD 07/30/2010    Qualifier: Diagnosis of  By: Jenny Reichmann MD, Hunt Oris   . PERIPHERAL VASCULAR DISEASE 02/23/2008    Qualifier: Diagnosis of  By: Jenny Reichmann MD, Hunt Oris   . HYPERTENSION 08/15/2007    Qualifier: Diagnosis of  By: Dance CMA ([place]), Kim    . HYPERLIPIDEMIA 02/23/2008    Qualifier: Diagnosis of  By: Jenny Reichmann MD, Hunt Oris   . DIABETES MELLITUS, TYPE II 01/29/2011    Qualifier: Diagnosis of  By: Jenny Reichmann MD, Hunt Oris   . ANXIETY 08/23/2009    Qualifier: Diagnosis of  By: Jenny Reichmann MD, Hunt Oris INSOMNIA-SLEEP DISORDER-UNSPEC 02/23/2008    Qualifier: Diagnosis of  By: Jenny Reichmann MD, [place] ERECTILE DYSFUNCTION 08/15/2007    Qualifier: Diagnosis of  By: Dance CMA ([place]), Kim    . COLONIC POLYPS, HX OF 01/29/2011    Qualifier: Diagnosis of  By: Jenny Reichmann MD, Hunt Oris    Past Surgical History  Procedure Laterality Date  . Carotid endarterectomy    . Coronary stent placement  2007  . Prostate biopsy  2009    reports that he has been smoking.  He has never used smokeless tobacco. He reports that he drinks alcohol. He  reports that he does not use illicit drugs. family history includes Hypertension in his father. Allergies  Allergen Reactions  . Codeine    Current Outpatient Prescriptions on File Prior to Visit  Medication Sig Dispense Refill  . LORazepam (ATIVAN) 1 MG tablet TAKE TWO (2) TABLETS BY MOUTH AT BEDTIMEAS NEEDED - to fill May 11, 2014  60 tablet  2  . metoprolol tartrate (LOPRESSOR) 25 MG tablet 1/2 tab by mouth twice per day  90 tablet  3   No current facility-administered medications on file prior to visit.   Review of Systems  Constitutional: Negative for unusual diaphoresis or other sweats  HENT: Negative for ringing in ear Eyes: Negative for double vision or worsening visual disturbance.  Respiratory: Negative for choking and stridor.   Gastrointestinal: Negative for vomiting or other signifcant bowel change Genitourinary: Negative for hematuria or decreased urine volume.  Musculoskeletal: Negative for other MSK pain or swelling Skin: Negative for color change and worsening wound.  Neurological: Negative for tremors and numbness other than noted  Psychiatric/Behavioral: Negative for decreased concentration or agitation other than above       Objective:   Physical Exam BP 140/86  Pulse 59  Temp(Src) 97.6 F (36.4 C) (Oral)  Ht 6' (1.829 m)  Wt 183 lb (83.008 kg)  BMI 24.81 kg/m2  SpO2 97% VS noted,  Constitutional: Pt appears well-developed, well-nourished.  HENT: Head: NCAT.  Right Ear: External ear normal.  Left Ear: External ear normal.  Eyes: . Pupils are equal, round, and reactive to light. Conjunctivae and EOM are normal Neck: Normal range of motion. Neck supple.  Cardiovascular: Normal rate and regular rhythm.   Pulmonary/Chest: Effort normal and breath sounds normal.  Abd:  Soft, NT, ND, + BS Neurological: Pt is alert. Not confused , motor grossly intact Skin: Skin is warm. No rash Psychiatric: Pt behavior is normal. No agitation. mild nervous      Assessment & Plan:

## 2014-05-15 NOTE — Assessment & Plan Note (Signed)
stable overall by history and exam, recent data reviewed with pt, and pt to continue medical treatment as before,  to f/u any worsening symptoms or concerns, gets most care at [facility]

## 2014-05-15 NOTE — Patient Instructions (Addendum)
Please continue all other medications as before, and refills have been done if requested.  Please have the pharmacy call with any other refills you may need.  Please continue your efforts at being more active, low cholesterol diet, and weight control.  You are otherwise up to date with prevention measures today.  Please keep your appointments with your specialists as you may have planned  Please forward your most recent lab work and medication list from the [place] to this address, or fax [phone]  Please return in 6 months, or sooner if needed

## 2014-05-15 NOTE — Progress Notes (Signed)
Pre visit review using our clinic review tool, if applicable. No additional management support is needed unless otherwise documented below in the visit note. 

## 2014-05-23 ENCOUNTER — Telehealth: Payer: Self-pay | Admitting: *Deleted

## 2014-05-23 NOTE — Telephone Encounter (Signed)
Patient will drop off copy of last lab results done at the [place].  Results to be abstracted in chart.

## 2014-08-26 ENCOUNTER — Other Ambulatory Visit: Payer: Self-pay | Admitting: Internal Medicine

## 2014-08-27 NOTE — Telephone Encounter (Signed)
Ativan request too soon, should still have refills

## 2014-08-28 ENCOUNTER — Other Ambulatory Visit: Payer: Self-pay | Admitting: Internal Medicine

## 2014-08-28 ENCOUNTER — Telehealth: Payer: Self-pay | Admitting: Internal Medicine

## 2014-08-28 NOTE — Telephone Encounter (Signed)
Patient's wife want to know why the request to refill the

## 2014-08-28 NOTE — Telephone Encounter (Signed)
Called the wife back informed the prescription was given hardcopy on the 05/15/14 office visit with Dr. Jenny Reichmann and had 5 refills.  The wife stated the pharmacy said there are no refills left.  The wife is going to call [facility] in the morning to inform of msg. From PCP.  She will call us back if there is a problem getting a refill on this prescription. The patient stated he has 2 left for the next two nights.

## 2014-08-28 NOTE — Telephone Encounter (Signed)
Patient's wife would like to know why the request to refill her husband's Lorazepam was denied.  She said the prescription is for 60 tablets, twice a day, and they picked up the last prescription on 07/29/14.  Please advise.

## 2014-08-30 MED ORDER — LORAZEPAM 1 MG PO TABS: ORAL_TABLET | ORAL | Status: DC

## 2014-08-30 NOTE — Telephone Encounter (Signed)
The patients wife called back to inform he is completely out of Lorazepam at this time.  The pharmacist last refill for lorazepam was dated to be filled May 11, 2014  With 2 refills (SEE June 18th, 2015 telephone note).  The patient was then seen on June 24th, 2015 for an office visit and documentation shows that a prescription was printed at that [place] for Lorazepam #60 with 5 refills, but the patient states as well as his wife he does not have that prescription.  Advise please as he is now completely out and needs for sleep.

## 2014-08-30 NOTE — Telephone Encounter (Signed)
Patient is calling back about husband prescriptions, She ask if you would please give her her back, Patient is totally out of medications.  Thank your

## 2014-08-30 NOTE — Telephone Encounter (Signed)
Patient new cell #  [phone]

## 2014-08-30 NOTE — Telephone Encounter (Signed)
Done hardcopy to robin  

## 2014-08-30 NOTE — Telephone Encounter (Signed)
Called the patient and his wife, informed faxed hardcopy for Lorazepam #60 with 2 refills to [facility] [place].

## 2014-11-16 ENCOUNTER — Other Ambulatory Visit: Payer: Self-pay | Admitting: Internal Medicine

## 2014-11-19 ENCOUNTER — Telehealth: Payer: Self-pay | Admitting: Internal Medicine

## 2014-11-19 MED ORDER — LORAZEPAM 1 MG PO TABS: ORAL_TABLET | ORAL | Status: DC

## 2014-11-19 NOTE — Telephone Encounter (Signed)
Pt called to check up on this request. Pt is out and pharmacy is going to close at 6pm.

## 2014-11-19 NOTE — Telephone Encounter (Signed)
Done hardcopy to robin, but note refill dated for jan 7, when next due for refill

## 2014-11-19 NOTE — Telephone Encounter (Signed)
Pt requesting refill of lorazapem, pt has appt 11/28/14 with Dr Jenny Reichmann

## 2014-11-20 NOTE — Telephone Encounter (Signed)
Faxed hardcopy for Lorazepam to [facility] [place].  Patients wife has been informed of Date to refilled per PCP dated on script.

## 2014-11-25 ENCOUNTER — Telehealth: Payer: Self-pay | Admitting: Internal Medicine

## 2014-11-25 NOTE — Telephone Encounter (Signed)
Patient is out of lorazepam.  Patient's wife states that patient needs med for today but refill instruction is not to fill until the 7th.  She is requesting help in regards to this.

## 2014-11-25 NOTE — Telephone Encounter (Signed)
MD is out will hold for his return on early refill...Jesse Hansen

## 2014-11-25 NOTE — Telephone Encounter (Signed)
Pt wife called back in, said that she wanted to speak to nurse or Dr Sandi Mealy!!!  I explain to her that Dr Jenny Reichmann was not in the office on Monday and because this was early and controlled, we would have to wait for Dr Jenny Reichmann to come in in the morning to take a look.  She got very loud and very rude. She said that" so you are telling me if my dr is out then Velora Heckler is useless".  I explained to her that due to it being controlled and I confirmed with lucy, we could do anything until Dr Jenny Reichmann was back in in the morning.  She asked for my name continued to yell at me that that was not good enough, pt was haven a bad day which caused her to have bad day and told me that's why I would have a bad day and hung up.

## 2014-11-25 NOTE — Telephone Encounter (Signed)
Wife called back.  She is requesting a couple pills to be sent in to the pharmacy.  She is requesting call back in regards.  Did tell patient that there might not be much we can do with Dr. Jenny Reichmann out of the office.

## 2014-11-26 NOTE — Telephone Encounter (Signed)
By computer record, pt is due for jan 7 refill, and will not be done before then; a rx has already been done dec 29  We cannot support increased use of medication by patient on his own due to the nature of this being a controlled substance and risk of dependence  Further disruptive behavior towards staff may results in patient dismissal from the practice

## 2014-11-28 ENCOUNTER — Encounter: Payer: Self-pay | Admitting: Internal Medicine

## 2014-11-28 ENCOUNTER — Ambulatory Visit (INDEPENDENT_AMBULATORY_CARE_PROVIDER_SITE_OTHER): Payer: Self-pay | Admitting: Internal Medicine

## 2014-11-28 VITALS — BP 142/80 | HR 72 | Temp 97.9°F | Ht 73.0 in | Wt 190.0 lb

## 2014-11-28 DIAGNOSIS — F411 Generalized anxiety disorder: Secondary | ICD-10-CM

## 2014-11-28 DIAGNOSIS — I10 Essential (primary) hypertension: Secondary | ICD-10-CM

## 2014-11-28 DIAGNOSIS — E785 Hyperlipidemia, unspecified: Secondary | ICD-10-CM

## 2014-11-28 DIAGNOSIS — E119 Type 2 diabetes mellitus without complications: Secondary | ICD-10-CM

## 2014-11-28 NOTE — Progress Notes (Signed)
Subjective:    Patient ID: Jesse Hansen, male    DOB: [birth year], 69 y.o.   MRN: [id number]  HPI  Here to f/u, has been out of benzo recent, anxeity uncontrolled and has affected his wife as well who has called recently to insist on refill, perhaps not realzing it was early.  Pill count however indicates med should not be needed until coincidently today.  Pt asknig for hardcopy klonopin, not realizing rx has been faxed to his pharmacy and available today for filling.  Pt denies overuse, lost pills or other form of diversion. Pt denies chest pain, increased sob or doe, wheezing, orthopnea, PND, increased LE swelling, palpitations, dizziness or syncope.  Pt denies new neurological symptoms such as new headache, or facial or extremity weakness or numbness  Denies worsening depressive symptoms, suicidal ideation, or panic; has ongoing anxiety.  Usually seen at [place], brings in recent 08/2014 lab evaluation with LDl 100, a1c 9.1, glucose 175.  Pt denies polydipsia, polyuria,   Past Medical History  Diagnosis Date  . CORONARY ARTERY DISEASE 02/23/2008    Qualifier: Diagnosis of  By: Jenny Reichmann MD, Hunt Oris   . PTSD 07/30/2010    Qualifier: Diagnosis of  By: Jenny Reichmann MD, Hunt Oris   . PERIPHERAL VASCULAR DISEASE 02/23/2008    Qualifier: Diagnosis of  By: Jenny Reichmann MD, Hunt Oris   . HYPERTENSION 08/15/2007    Qualifier: Diagnosis of  By: Dance CMA ([place]), Kim    . HYPERLIPIDEMIA 02/23/2008    Qualifier: Diagnosis of  By: Jenny Reichmann MD, Hunt Oris   . DIABETES MELLITUS, TYPE II 01/29/2011    Qualifier: Diagnosis of  By: Jenny Reichmann MD, Hunt Oris   . ANXIETY 08/23/2009    Qualifier: Diagnosis of  By: Jenny Reichmann MD, Hunt Oris INSOMNIA-SLEEP DISORDER-UNSPEC 02/23/2008    Qualifier: Diagnosis of  By: Jenny Reichmann MD, [place] ERECTILE DYSFUNCTION 08/15/2007    Qualifier: Diagnosis of  By: Dance CMA ([place]), Kim    . COLONIC POLYPS, HX OF 01/29/2011    Qualifier: Diagnosis of  By: Jenny Reichmann MD, Hunt Oris    Past Surgical History  Procedure Laterality Date  . Carotid  endarterectomy    . Coronary stent placement  2007  . Prostate biopsy  2009    reports that he has been smoking.  He has never used smokeless tobacco. He reports that he drinks alcohol. He reports that he does not use illicit drugs. family history includes Hypertension in his father. Allergies  Allergen Reactions  . Codeine    Current Outpatient Prescriptions on File Prior to Visit  Medication Sig Dispense Refill  . aspirin EC 81 MG tablet Take 1 tablet (81 mg total) by mouth daily. 90 tablet 11  . lisinopril (PRINIVIL,ZESTRIL) 10 MG tablet Take 1 tablet (10 mg total) by mouth daily. 90 tablet 3  . LORazepam (ATIVAN) 1 MG tablet TAKE TWO (2) TABLETS BY MOUTH AT [facility] NEEDED, to fill Nov 28, 2014 as planned 60 tablet 2  . lovastatin (MEVACOR) 20 MG tablet Take 1 tablet (20 mg total) by mouth at bedtime. 90 tablet 3  . metoprolol tartrate (LOPRESSOR) 25 MG tablet 1/2 tab by mouth twice per day 90 tablet 3   No current facility-administered medications on file prior to visit.   Review of Systems  Constitutional: Negative for unusual diaphoresis or other sweats  HENT: Negative for ringing in ear Eyes: Negative for double vision or worsening visual disturbance.  Respiratory: Negative for  choking and stridor.   Gastrointestinal: Negative for vomiting or other signifcant bowel change Genitourinary: Negative for hematuria or decreased urine volume.  Musculoskeletal: Negative for other MSK pain or swelling Skin: Negative for color change and worsening wound.  Neurological: Negative for tremors and numbness other than noted  Psychiatric/Behavioral: Negative for decreased concentration or agitation other than above       Objective:   Physical Exam BP 142/80 mmHg  Pulse 72  Temp(Src) 97.9 F (36.6 C) (Oral)  Ht 6\' 1"  (1.854 m)  Wt 190 lb (86.183 kg)  BMI 25.07 kg/m2  SpO2 97% VS noted, not ill appearing Constitutional: Pt appears well-developed, well-nourished.  HENT: Head: NCAT.    Right Ear: External ear normal.  Left Ear: External ear normal.  Eyes: . Pupils are equal, round, and reactive to light. Conjunctivae and EOM are normal Neck: Normal range of motion. Neck supple.  Cardiovascular: Normal rate and regular rhythm.   Pulmonary/Chest: Effort normal and breath sounds without rales or wheezing.  Abd:  Soft, NT, ND, + BS Neurological: Pt is alert. Not confused , motor grossly intact Skin: Skin is warm. No rash Psychiatric: Pt behavior is normal. mild agitation, 1-2+ nervous.     Assessment & Plan:

## 2014-11-28 NOTE — Patient Instructions (Addendum)
This is your most recent record of lorazepam from the [place] Controlled Substance registry which we are supposed to follow by law:  08/30/2014 08/30/2014 LORAZEPAM 1 MG TABLET [id number] 16 94 0 2 [id number] Biagio Borg MD [facility] [place], [place] RAINIER, FEUERBORN Sep 30, 1946 [address] [place], [place] [address] 01 0 07/29/2014 05/10/2014 LORAZEPAM 1 MG TABLET [id number] 94 30 2 2  [id number] Biagio Borg MD [facility] [place] DREYSON, MISHKIN 12-11-1945 [address] [place], [place] [address] 01 0 06/24/2014 05/10/2014 LORAZEPAM 1 MG TABLET [id number] 10 30 1 2  [id number] Biagio Borg MD [facility] [place] JERVON, REAM 31-Jan-1946 [place],  [address] 01 0  As you can see, your last refill was done Oct 9, which should last 3 mo total, and not due until Jan 7 (today)  Please do not take extra or lose pills as this cannot be supported with other medication.   You should be able to pick this up at the pharmacy today  Please continue all other medications as before, and refills have been done if requested.  Please have the pharmacy call with any other refills you may need.  Please continue your efforts at being more active, low cholesterol diet, and weight control.  Please keep your appointments with your specialists as you may have planned  Please return in 6 months, or sooner if needed,

## 2014-11-28 NOTE — Progress Notes (Signed)
Pre visit review using our clinic review tool, if applicable. No additional management support is needed unless otherwise documented below in the visit note. 

## 2014-11-29 ENCOUNTER — Telehealth: Payer: Self-pay | Admitting: Internal Medicine

## 2014-11-29 NOTE — Telephone Encounter (Signed)
emmi mailed  °

## 2014-11-29 NOTE — Assessment & Plan Note (Signed)
Probably mild uncontrolled as well, o/w stable overall by history and exam, recent data reviewed with pt, and pt to continue medical treatment as before - declines any med change,  to f/u any worsening symptoms or concerns BP Readings from Last 3 Encounters:  11/28/14 142/80  05/15/14 140/86  03/02/13 128/70

## 2014-11-29 NOTE — Assessment & Plan Note (Signed)
D/w pt uncontrolled DM, inisists on no changes today as he prefers followed by [place], next visit in a few months, to cont diet, exercise, wt control

## 2014-11-29 NOTE — Assessment & Plan Note (Signed)
D/w pt - goal LDL < 70, again declines further change at this time, will work on lower chol diet, cont same med tx,  to f/u any worsening symptoms or concerns

## 2014-11-29 NOTE — Assessment & Plan Note (Signed)
D/w pt, ok to fill klonopin already sent to his pharmacy as it is due today,  to f/u any worsening symptoms or concerns

## 2015-01-31 ENCOUNTER — Telehealth: Payer: Self-pay

## 2015-02-03 NOTE — Telephone Encounter (Signed)
Call to patient who confirms flu shot and Pnuemonia but did not know which one he received.  both rec'd 09/2014

## 2015-02-24 ENCOUNTER — Other Ambulatory Visit: Payer: Self-pay | Admitting: Internal Medicine

## 2015-02-25 MED ORDER — LORAZEPAM 1 MG PO TABS: ORAL_TABLET | ORAL | Status: DC

## 2015-02-25 NOTE — Telephone Encounter (Signed)
Rx done. 

## 2015-02-25 NOTE — Telephone Encounter (Signed)
Done hardcopy to Cherina  

## 2015-05-06 ENCOUNTER — Telehealth: Payer: Self-pay

## 2015-05-06 NOTE — Telephone Encounter (Signed)
Pt will walk into lab and get HgA1C checked on 05/08/2015

## 2015-05-30 ENCOUNTER — Other Ambulatory Visit: Payer: Self-pay | Admitting: Internal Medicine

## 2015-05-30 NOTE — Telephone Encounter (Signed)
Done Done hardcopy to Dahlia  

## 2015-05-30 NOTE — Telephone Encounter (Signed)
Rx faxed to pharmacy  

## 2015-07-29 ENCOUNTER — Other Ambulatory Visit: Payer: Self-pay | Admitting: Internal Medicine

## 2015-07-29 NOTE — Telephone Encounter (Signed)
Rx faxed to pharmacy  

## 2015-07-29 NOTE — Telephone Encounter (Signed)
Done hardcopy to Dahlia  

## 2015-10-20 ENCOUNTER — Other Ambulatory Visit: Payer: Self-pay | Admitting: Internal Medicine

## 2015-10-21 NOTE — Telephone Encounter (Signed)
Done hardcopy to Dahlia  

## 2015-10-21 NOTE — Telephone Encounter (Signed)
Rx faxed to pharmacy  

## 2016-01-25 ENCOUNTER — Inpatient Hospital Stay (HOSPITAL_BASED_OUTPATIENT_CLINIC_OR_DEPARTMENT_OTHER)
Admission: EM | Admit: 2016-01-25 | Discharge: 2016-01-27 | DRG: 190 | Disposition: A | Discharge status: Home / Self Care | Payer: Non-veteran care | Attending: Internal Medicine | Admitting: Internal Medicine

## 2016-01-25 ENCOUNTER — Emergency Department (HOSPITAL_BASED_OUTPATIENT_CLINIC_OR_DEPARTMENT_OTHER): Payer: Non-veteran care

## 2016-01-25 ENCOUNTER — Encounter (HOSPITAL_BASED_OUTPATIENT_CLINIC_OR_DEPARTMENT_OTHER): Payer: Self-pay | Admitting: Emergency Medicine

## 2016-01-25 DIAGNOSIS — F4310 Post-traumatic stress disorder, unspecified: Secondary | ICD-10-CM | POA: Diagnosis present

## 2016-01-25 DIAGNOSIS — I739 Peripheral vascular disease, unspecified: Secondary | ICD-10-CM | POA: Diagnosis present

## 2016-01-25 DIAGNOSIS — Z955 Presence of coronary angioplasty implant and graft: Secondary | ICD-10-CM

## 2016-01-25 DIAGNOSIS — I509 Heart failure, unspecified: Secondary | ICD-10-CM

## 2016-01-25 DIAGNOSIS — I25118 Atherosclerotic heart disease of native coronary artery with other forms of angina pectoris: Secondary | ICD-10-CM | POA: Diagnosis present

## 2016-01-25 DIAGNOSIS — I447 Left bundle-branch block, unspecified: Secondary | ICD-10-CM | POA: Diagnosis present

## 2016-01-25 DIAGNOSIS — E1151 Type 2 diabetes mellitus with diabetic peripheral angiopathy without gangrene: Secondary | ICD-10-CM

## 2016-01-25 DIAGNOSIS — Z7982 Long term (current) use of aspirin: Secondary | ICD-10-CM

## 2016-01-25 DIAGNOSIS — J440 Chronic obstructive pulmonary disease with acute lower respiratory infection: Secondary | ICD-10-CM | POA: Diagnosis not present

## 2016-01-25 DIAGNOSIS — R7989 Other specified abnormal findings of blood chemistry: Secondary | ICD-10-CM | POA: Diagnosis present

## 2016-01-25 DIAGNOSIS — I350 Nonrheumatic aortic (valve) stenosis: Secondary | ICD-10-CM

## 2016-01-25 DIAGNOSIS — Z7739 Contact with and (suspected) exposure to other war theater: Secondary | ICD-10-CM | POA: Diagnosis present

## 2016-01-25 DIAGNOSIS — I5033 Acute on chronic diastolic (congestive) heart failure: Secondary | ICD-10-CM | POA: Diagnosis present

## 2016-01-25 DIAGNOSIS — E785 Hyperlipidemia, unspecified: Secondary | ICD-10-CM | POA: Diagnosis present

## 2016-01-25 DIAGNOSIS — R509 Fever, unspecified: Secondary | ICD-10-CM | POA: Diagnosis not present

## 2016-01-25 DIAGNOSIS — Z77098 Contact with and (suspected) exposure to other hazardous, chiefly nonmedicinal, chemicals: Secondary | ICD-10-CM | POA: Diagnosis present

## 2016-01-25 DIAGNOSIS — I110 Hypertensive heart disease with heart failure: Secondary | ICD-10-CM | POA: Diagnosis present

## 2016-01-25 DIAGNOSIS — I248 Other forms of acute ischemic heart disease: Secondary | ICD-10-CM | POA: Diagnosis present

## 2016-01-25 DIAGNOSIS — E119 Type 2 diabetes mellitus without complications: Secondary | ICD-10-CM

## 2016-01-25 DIAGNOSIS — I10 Essential (primary) hypertension: Secondary | ICD-10-CM | POA: Diagnosis present

## 2016-01-25 DIAGNOSIS — J8489 Other specified interstitial pulmonary diseases: Secondary | ICD-10-CM

## 2016-01-25 DIAGNOSIS — F17210 Nicotine dependence, cigarettes, uncomplicated: Secondary | ICD-10-CM | POA: Diagnosis present

## 2016-01-25 DIAGNOSIS — J441 Chronic obstructive pulmonary disease with (acute) exacerbation: Secondary | ICD-10-CM | POA: Diagnosis present

## 2016-01-25 DIAGNOSIS — I255 Ischemic cardiomyopathy: Secondary | ICD-10-CM | POA: Diagnosis present

## 2016-01-25 DIAGNOSIS — I25810 Atherosclerosis of coronary artery bypass graft(s) without angina pectoris: Secondary | ICD-10-CM

## 2016-01-25 DIAGNOSIS — Z7984 Long term (current) use of oral hypoglycemic drugs: Secondary | ICD-10-CM

## 2016-01-25 DIAGNOSIS — Z951 Presence of aortocoronary bypass graft: Secondary | ICD-10-CM

## 2016-01-25 DIAGNOSIS — G4700 Insomnia, unspecified: Secondary | ICD-10-CM | POA: Diagnosis present

## 2016-01-25 DIAGNOSIS — I208 Other forms of angina pectoris: Secondary | ICD-10-CM | POA: Diagnosis present

## 2016-01-25 DIAGNOSIS — J189 Pneumonia, unspecified organism: Secondary | ICD-10-CM | POA: Diagnosis present

## 2016-01-25 DIAGNOSIS — I2089 Other forms of angina pectoris: Secondary | ICD-10-CM | POA: Diagnosis present

## 2016-01-25 DIAGNOSIS — Z7902 Long term (current) use of antithrombotics/antiplatelets: Secondary | ICD-10-CM

## 2016-01-25 DIAGNOSIS — R778 Other specified abnormalities of plasma proteins: Secondary | ICD-10-CM | POA: Diagnosis present

## 2016-01-25 HISTORY — DX: Atherosclerotic heart disease of native coronary artery with unspecified angina pectoris: I25.119

## 2016-01-25 LAB — CBC WITH DIFFERENTIAL/PLATELET
BASOS PCT: 0 %
Basophils Absolute: 0 10*3/uL (ref 0.0–0.1)
Eosinophils Absolute: 0.1 10*3/uL (ref 0.0–0.7)
Eosinophils Relative: 1 %
HEMATOCRIT: 41.1 % (ref 39.0–52.0)
HEMOGLOBIN: 14 g/dL (ref 13.0–17.0)
LYMPHS PCT: 13 %
Lymphs Abs: 1 10*3/uL (ref 0.7–4.0)
MCH: 29.5 pg (ref 26.0–34.0)
MCHC: 34.1 g/dL (ref 30.0–36.0)
MCV: 86.7 fL (ref 78.0–100.0)
Monocytes Absolute: 0.6 10*3/uL (ref 0.1–1.0)
Monocytes Relative: 8 %
NEUTROS ABS: 5.9 10*3/uL (ref 1.7–7.7)
NEUTROS PCT: 78 %
Platelets: 183 10*3/uL (ref 150–400)
RBC: 4.74 MIL/uL (ref 4.22–5.81)
RDW: 14.1 % (ref 11.5–15.5)
WBC: 7.6 10*3/uL (ref 4.0–10.5)

## 2016-01-25 LAB — BASIC METABOLIC PANEL
ANION GAP: 10 (ref 5–15)
BUN: 12 mg/dL (ref 6–20)
CHLORIDE: 100 mmol/L — AB (ref 101–111)
CO2: 24 mmol/L (ref 22–32)
CREATININE: 0.98 mg/dL (ref 0.61–1.24)
Calcium: 9.1 mg/dL (ref 8.9–10.3)
GFR calc non Af Amer: >60 mL/min (ref >60)
Glucose, Bld: 116 mg/dL — ABNORMAL HIGH (ref 65–99)
POTASSIUM: 3.8 mmol/L (ref 3.5–5.1)
Sodium: 134 mmol/L — ABNORMAL LOW (ref 135–145)

## 2016-01-25 LAB — TROPONIN I: TROPONIN I: 0.06 ng/mL — AB (ref <0.031)

## 2016-01-25 LAB — BRAIN NATRIURETIC PEPTIDE: B NATRIURETIC PEPTIDE 5: 453.6 pg/mL — AB (ref 0.0–100.0)

## 2016-01-25 MED ORDER — ASPIRIN 81 MG PO CHEW
324.0000 mg | CHEWABLE_TABLET | Freq: Once | ORAL | Status: AC
  Administered 2016-01-25: 324 mg via ORAL
  Filled 2016-01-25: qty 4

## 2016-01-25 MED ORDER — DEXTROSE 5 % IV SOLN
1.0000 g | Freq: Once | INTRAVENOUS | Status: AC
  Administered 2016-01-25: 1 g via INTRAVENOUS
  Filled 2016-01-25: qty 10

## 2016-01-25 MED ORDER — AZITHROMYCIN 500 MG IV SOLR
500.0000 mg | Freq: Once | INTRAVENOUS | Status: AC
  Administered 2016-01-25: 500 mg via INTRAVENOUS

## 2016-01-25 MED ORDER — IPRATROPIUM-ALBUTEROL 0.5-2.5 (3) MG/3ML IN SOLN
3.0000 mL | Freq: Once | RESPIRATORY_TRACT | Status: AC
  Administered 2016-01-25: 3 mL via RESPIRATORY_TRACT
  Filled 2016-01-25: qty 3

## 2016-01-25 MED ORDER — AZITHROMYCIN 500 MG IV SOLR
INTRAVENOUS | Status: DC
Start: 2016-01-25 — End: 2016-01-26
  Filled 2016-01-25: qty 500

## 2016-01-25 NOTE — ED Notes (Signed)
Per ED PA okay for patient to eat.

## 2016-01-25 NOTE — ED Provider Notes (Signed)
CSN: [id number]     Arrival date & time 01/25/16  1905 History   First MD Initiated Contact with Patient 01/25/16 2003     Chief Complaint  Patient presents with  . Fever  . Cough     (Consider location/radiation/quality/duration/timing/severity/associated sxs/prior Treatment) The history is provided by the patient and a relative.     Pt with hx CAD, HTN, HLD, DM, smoker p/w 5-6 weeks of nonproductive cough, 1 week of more disturbing cough that feels like he has something in his chest he cannot get up with associated SOB, and two days of fever, TMax 101.3.  Mild rhinorrhea. Has taken nyquil with temporary relief.  He does have a sick contact in family member who just got over URI.  Denies nasal congestion, sore throat, chest pain, lightheadedness/dizziness, diaphoresis, nausea, weakness, leg swelling, abdominal pain.   Past Medical History  Diagnosis Date  . CORONARY ARTERY DISEASE 02/23/2008    Qualifier: Diagnosis of  By: Jenny Reichmann MD, Hunt Oris   . PTSD 07/30/2010    Qualifier: Diagnosis of  By: Jenny Reichmann MD, Hunt Oris   . PERIPHERAL VASCULAR DISEASE 02/23/2008    Qualifier: Diagnosis of  By: Jenny Reichmann MD, Hunt Oris   . HYPERTENSION 08/15/2007    Qualifier: Diagnosis of  By: Dance CMA ([place]), Kim    . HYPERLIPIDEMIA 02/23/2008    Qualifier: Diagnosis of  By: Jenny Reichmann MD, Hunt Oris   . DIABETES MELLITUS, TYPE II 01/29/2011    Qualifier: Diagnosis of  By: Jenny Reichmann MD, Hunt Oris   . ANXIETY 08/23/2009    Qualifier: Diagnosis of  By: Jenny Reichmann MD, Hunt Oris INSOMNIA-SLEEP DISORDER-UNSPEC 02/23/2008    Qualifier: Diagnosis of  By: Jenny Reichmann MD, [place] ERECTILE DYSFUNCTION 08/15/2007    Qualifier: Diagnosis of  By: Dance CMA ([place]), Kim    . COLONIC POLYPS, HX OF 01/29/2011    Qualifier: Diagnosis of  By: Jenny Reichmann MD, Hunt Oris    Past Surgical History  Procedure Laterality Date  . Carotid endarterectomy    . Coronary stent placement  2007  . Prostate biopsy  2009   Family History  Problem Relation Age of Onset  . Hypertension  Father    Social History  Substance Use Topics  . Smoking status: Current Every Day Smoker  . Smokeless tobacco: Never Used  . Alcohol Use: Yes    Review of Systems  All other systems reviewed and are negative.     Allergies  Codeine  Home Medications   Prior to Admission medications   Medication Sig Start Date End Date Taking? Authorizing Provider  aspirin EC 81 MG tablet Take 1 tablet (81 mg total) by mouth daily. 05/15/14   Biagio Borg, MD  lisinopril (PRINIVIL,ZESTRIL) 10 MG tablet Take 1 tablet (10 mg total) by mouth daily. 05/15/14   Biagio Borg, MD  LORazepam (ATIVAN) 1 MG tablet TAKE TWO (2) TABLETS BY MOUTH AT [facility] NEEDED 10/21/15   Biagio Borg, MD  lovastatin (MEVACOR) 20 MG tablet Take 1 tablet (20 mg total) by mouth at bedtime. 05/15/14   Biagio Borg, MD  metoprolol tartrate (LOPRESSOR) 25 MG tablet 1/2 tab by mouth twice per day 03/04/12   Biagio Borg, MD   BP 142/72 mmHg  Pulse 81  Temp(Src) 99.1 F (37.3 C) (Oral)  Resp 18  Ht 6' (1.829 m)  Wt 87.544 kg  BMI 26.17 kg/m2  SpO2 96% Physical Exam  Constitutional: He appears well-developed  and well-nourished. No distress.  HENT:  Head: Normocephalic and atraumatic.  Neck: Neck supple.  Cardiovascular: Normal rate and regular rhythm.   Pulmonary/Chest: Effort normal. No respiratory distress. He has no rales.  Occasional wheeze, bilateral  Abdominal: Soft. He exhibits no distension and no mass. There is no tenderness. There is no rebound and no guarding.  Musculoskeletal: He exhibits no edema.  Neurological: He is alert. He exhibits normal muscle tone.  Skin: He is not diaphoretic.  Psychiatric: He has a normal mood and affect. His behavior is normal.  Nursing note and vitals reviewed.   ED Course  Procedures (including critical care time) Labs Review Labs Reviewed  BASIC METABOLIC PANEL - Abnormal; Notable for the following:    Sodium 134 (*)    Chloride 100 (*)    Glucose, Bld 116 (*)     All other components within normal limits  TROPONIN I - Abnormal; Notable for the following:    Troponin I 0.06 (*)    All other components within normal limits  BRAIN NATRIURETIC PEPTIDE - Abnormal; Notable for the following:    B Natriuretic Peptide 453.6 (*)    All other components within normal limits  CBC WITH DIFFERENTIAL/PLATELET  INFLUENZA PANEL BY PCR (TYPE A & B, H1N1)    Imaging Review Dg Chest 2 View  01/25/2016  CLINICAL DATA:  Cough, fever and chest pain for 2 days. EXAM: CHEST  2 VIEW COMPARISON:  01/27/2015 FINDINGS: Stable surgical changes from bypass surgery. The heart is normal in size. There is mild tortuosity and calcification of the thoracic aorta. The lungs demonstrate peribronchial thickening and increased interstitial markings which suggest bronchitis or interstitial pneumonitis. Small left hiatal hernia with some adjacent atelectasis. No pleural effusion. There are thickened peripheral lower lobe interstitial lines (Kerley B-lines. Could not exclude mild interstitial edema. IMPRESSION: 1. Findings suggest bronchitis or interstitial pneumonitis. 2. Kerley B-lines could suggest mild interstitial edema. 3. No definite infiltrates or effusions.  Left basilar atelectasis. Electronically Signed   By: Marijo Sanes M.D.   On: 01/25/2016 19:40   I have personally reviewed and evaluated these images and lab results as part of my medical decision-making.   EKG Interpretation   Date/Time:  Sunday January 25 2016 20:22:11 EST Ventricular Rate:  64 PR Interval:  192 QRS Duration: 146 QT Interval:  427 QTC Calculation: 441 R Axis:   -35 Text Interpretation:  Sinus rhythm Left bundle branch block No old tracing  to compare Confirmed by BELFI  MD, [place] [id number]) on 01/25/2016 8:26:47 PM       9:54 PM I spoke with [facility] who states admission is impossible there are no monitored beds and they have many holding in the ED for beds.    10:19 PM Admitted to  [facility], Dr Tamala Julian accepting.    MDM   Final diagnoses:  Interstitial pneumonitis (HCC)  Elevated troponin  Left bundle branch block  Coronary artery disease involving other coronary artery bypass graft without angina pectoris    Pt with hx CAD s/p CABG and stents, HTN, HLD, DM p/w cough, SOB, recent fever.  The cough and feeling there is something in his chest he cannot get up and SOB have been present for the past week, fever/cough/cold symptoms in the past two days.  CXR demonstrates interstitial pneumonitis vs bronchitis and interstitial edema.  Troponin slightly elevated at 0.06, likely from demand.  BNP elevated 453 - pt not known to have heart failure.  Labs otherwise unremarkable.  Pt is comfortable appearing.  Also seen by Dr Tamera Punt.  Pt's cardiologist is at [facility] and I did call and try to get him admitted there but they have no monitored meds.  Patient agreed to admission at [facility].  Dr Tamala Julian accepting.       [place]rett, PA-C 01/25/16 [place], MD 01/26/16 1640

## 2016-01-25 NOTE — ED Notes (Signed)
MD at bedside. 

## 2016-01-25 NOTE — Progress Notes (Signed)
Transfer from [facility] to [facility] 70 year old male with past medical history significant for HTN, HLD, DM, CAD s/p CABG, tobacco abs; who presents with history of progressively worsening cough. Associated symptoms of fever, congestion, mild shortness of breath, and productive-like cough. Vital signs otherwise stable. Chest x-ray shows signs of bronchitis versus interstitial pneumonia with mild edema.Patient found to have elevated troponin at 0.06 on admission. EKG showing sinus rhythm with a left bundle branch block, but no previous tracing to visually compare. Otherwise stable admitting patient for observation to a telemetry bed. Patient was started on antibiotics Rocephin and azithromycin for possible community-acquired pneumonia prior to transfer.

## 2016-01-25 NOTE — ED Notes (Signed)
Pt in c/o fever and cough onset 2 days ago, was told to come in by [place]. Had flu shot this year.

## 2016-01-25 NOTE — ED Notes (Signed)
PA at the bedside.

## 2016-01-26 ENCOUNTER — Observation Stay (HOSPITAL_COMMUNITY): Payer: Non-veteran care

## 2016-01-26 ENCOUNTER — Encounter (HOSPITAL_COMMUNITY): Payer: Self-pay | Admitting: Cardiology

## 2016-01-26 DIAGNOSIS — Z77098 Contact with and (suspected) exposure to other hazardous, chiefly nonmedicinal, chemicals: Secondary | ICD-10-CM | POA: Diagnosis present

## 2016-01-26 DIAGNOSIS — Z7984 Long term (current) use of oral hypoglycemic drugs: Secondary | ICD-10-CM | POA: Diagnosis not present

## 2016-01-26 DIAGNOSIS — Z7982 Long term (current) use of aspirin: Secondary | ICD-10-CM | POA: Diagnosis not present

## 2016-01-26 DIAGNOSIS — I208 Other forms of angina pectoris: Secondary | ICD-10-CM | POA: Diagnosis present

## 2016-01-26 DIAGNOSIS — J441 Chronic obstructive pulmonary disease with (acute) exacerbation: Secondary | ICD-10-CM | POA: Diagnosis present

## 2016-01-26 DIAGNOSIS — E785 Hyperlipidemia, unspecified: Secondary | ICD-10-CM

## 2016-01-26 DIAGNOSIS — I2089 Other forms of angina pectoris: Secondary | ICD-10-CM | POA: Diagnosis present

## 2016-01-26 DIAGNOSIS — E1151 Type 2 diabetes mellitus with diabetic peripheral angiopathy without gangrene: Secondary | ICD-10-CM | POA: Diagnosis present

## 2016-01-26 DIAGNOSIS — I447 Left bundle-branch block, unspecified: Secondary | ICD-10-CM | POA: Diagnosis not present

## 2016-01-26 DIAGNOSIS — Z951 Presence of aortocoronary bypass graft: Secondary | ICD-10-CM | POA: Diagnosis not present

## 2016-01-26 DIAGNOSIS — I25118 Atherosclerotic heart disease of native coronary artery with other forms of angina pectoris: Secondary | ICD-10-CM | POA: Diagnosis present

## 2016-01-26 DIAGNOSIS — J189 Pneumonia, unspecified organism: Secondary | ICD-10-CM | POA: Diagnosis present

## 2016-01-26 DIAGNOSIS — I25119 Atherosclerotic heart disease of native coronary artery with unspecified angina pectoris: Secondary | ICD-10-CM | POA: Diagnosis not present

## 2016-01-26 DIAGNOSIS — R079 Chest pain, unspecified: Secondary | ICD-10-CM

## 2016-01-26 DIAGNOSIS — J440 Chronic obstructive pulmonary disease with acute lower respiratory infection: Secondary | ICD-10-CM | POA: Diagnosis present

## 2016-01-26 DIAGNOSIS — Z955 Presence of coronary angioplasty implant and graft: Secondary | ICD-10-CM | POA: Diagnosis not present

## 2016-01-26 DIAGNOSIS — I255 Ischemic cardiomyopathy: Secondary | ICD-10-CM | POA: Diagnosis not present

## 2016-01-26 DIAGNOSIS — G4700 Insomnia, unspecified: Secondary | ICD-10-CM

## 2016-01-26 DIAGNOSIS — E119 Type 2 diabetes mellitus without complications: Secondary | ICD-10-CM

## 2016-01-26 DIAGNOSIS — I248 Other forms of acute ischemic heart disease: Secondary | ICD-10-CM | POA: Diagnosis present

## 2016-01-26 DIAGNOSIS — I5041 Acute combined systolic (congestive) and diastolic (congestive) heart failure: Secondary | ICD-10-CM | POA: Diagnosis not present

## 2016-01-26 DIAGNOSIS — I110 Hypertensive heart disease with heart failure: Secondary | ICD-10-CM | POA: Diagnosis present

## 2016-01-26 DIAGNOSIS — I350 Nonrheumatic aortic (valve) stenosis: Secondary | ICD-10-CM | POA: Diagnosis not present

## 2016-01-26 DIAGNOSIS — I509 Heart failure, unspecified: Secondary | ICD-10-CM | POA: Diagnosis not present

## 2016-01-26 DIAGNOSIS — Z7902 Long term (current) use of antithrombotics/antiplatelets: Secondary | ICD-10-CM | POA: Diagnosis not present

## 2016-01-26 DIAGNOSIS — F4310 Post-traumatic stress disorder, unspecified: Secondary | ICD-10-CM | POA: Diagnosis present

## 2016-01-26 DIAGNOSIS — I5033 Acute on chronic diastolic (congestive) heart failure: Secondary | ICD-10-CM | POA: Diagnosis present

## 2016-01-26 DIAGNOSIS — R509 Fever, unspecified: Secondary | ICD-10-CM | POA: Diagnosis present

## 2016-01-26 DIAGNOSIS — F17210 Nicotine dependence, cigarettes, uncomplicated: Secondary | ICD-10-CM | POA: Diagnosis present

## 2016-01-26 DIAGNOSIS — I10 Essential (primary) hypertension: Secondary | ICD-10-CM

## 2016-01-26 LAB — CBC WITH DIFFERENTIAL/PLATELET
Basophils Absolute: 0 10*3/uL (ref 0.0–0.1)
Basophils Relative: 0 %
EOS ABS: 0 10*3/uL (ref 0.0–0.7)
Eosinophils Relative: 1 %
HEMATOCRIT: 42.7 % (ref 39.0–52.0)
HEMOGLOBIN: 14.4 g/dL (ref 13.0–17.0)
LYMPHS ABS: 1.1 10*3/uL (ref 0.7–4.0)
LYMPHS PCT: 15 %
MCH: 29.4 pg (ref 26.0–34.0)
MCHC: 33.7 g/dL (ref 30.0–36.0)
MCV: 87.3 fL (ref 78.0–100.0)
Monocytes Absolute: 0.5 10*3/uL (ref 0.1–1.0)
Monocytes Relative: 7 %
NEUTROS ABS: 5.4 10*3/uL (ref 1.7–7.7)
NEUTROS PCT: 77 %
Platelets: 178 10*3/uL (ref 150–400)
RBC: 4.89 MIL/uL (ref 4.22–5.81)
RDW: 14.3 % (ref 11.5–15.5)
WBC: 7 10*3/uL (ref 4.0–10.5)

## 2016-01-26 LAB — LIPID PANEL
CHOL/HDL RATIO: 6.3 ratio
CHOLESTEROL: 189 mg/dL (ref 0–200)
HDL: 30 mg/dL — AB (ref >40)
LDL Cholesterol: 135 mg/dL — ABNORMAL HIGH (ref 0–99)
TRIGLYCERIDES: 121 mg/dL (ref <150)
VLDL: 24 mg/dL (ref 0–40)

## 2016-01-26 LAB — INFLUENZA PANEL BY PCR (TYPE A & B)
H1N1 flu by pcr: NOT DETECTED
INFLBPCR: NEGATIVE
Influenza A By PCR: NEGATIVE

## 2016-01-26 LAB — GLUCOSE, CAPILLARY
GLUCOSE-CAPILLARY: 174 mg/dL — AB (ref 65–99)
Glucose-Capillary: 123 mg/dL — ABNORMAL HIGH (ref 65–99)

## 2016-01-26 LAB — HIV ANTIBODY (ROUTINE TESTING W REFLEX): HIV Screen 4th Generation wRfx: NONREACTIVE

## 2016-01-26 LAB — TROPONIN I
TROPONIN I: 0.06 ng/mL — AB (ref <0.031)
TROPONIN I: 0.07 ng/mL — AB (ref <0.031)
TROPONIN I: 0.09 ng/mL — AB (ref <0.031)
TROPONIN I: 0.06 ng/mL — AB (ref <0.031)

## 2016-01-26 LAB — TSH: TSH: 2.187 u[IU]/mL (ref 0.350–4.500)

## 2016-01-26 LAB — STREP PNEUMONIAE URINARY ANTIGEN: Strep Pneumo Urinary Antigen: NEGATIVE

## 2016-01-26 MED ORDER — VITAMIN D 1000 UNITS PO TABS
4000.0000 [IU] | ORAL_TABLET | Freq: Every day | ORAL | Status: DC
  Administered 2016-01-26 – 2016-01-27 (×2): 4000 [IU] via ORAL
  Filled 2016-01-26 (×2): qty 4

## 2016-01-26 MED ORDER — BUDESONIDE 0.5 MG/2ML IN SUSP
0.5000 mg | Freq: Two times a day (BID) | RESPIRATORY_TRACT | Status: DC
  Administered 2016-01-26 – 2016-01-27 (×3): 0.5 mg via RESPIRATORY_TRACT
  Filled 2016-01-26 (×3): qty 2

## 2016-01-26 MED ORDER — GI COCKTAIL ~~LOC~~: 30.0000 mL | Freq: Four times a day (QID) | ORAL | Status: DC | PRN

## 2016-01-26 MED ORDER — AZITHROMYCIN 500 MG IV SOLR
500.0000 mg | INTRAVENOUS | Status: DC
  Administered 2016-01-26: 500 mg via INTRAVENOUS
  Filled 2016-01-26 (×2): qty 500

## 2016-01-26 MED ORDER — ACETAMINOPHEN 325 MG PO TABS
650.0000 mg | ORAL_TABLET | ORAL | Status: DC | PRN
  Administered 2016-01-26: 650 mg via ORAL
  Filled 2016-01-26: qty 2

## 2016-01-26 MED ORDER — ACETAMINOPHEN 325 MG PO TABS: 650.0000 mg | ORAL_TABLET | ORAL | Status: DC | PRN

## 2016-01-26 MED ORDER — CLOPIDOGREL BISULFATE 75 MG PO TABS
75.0000 mg | ORAL_TABLET | Freq: Every day | ORAL | Status: DC
Start: 2016-01-26 — End: 2016-01-27
  Administered 2016-01-26 – 2016-01-27 (×2): 75 mg via ORAL
  Filled 2016-01-26 (×2): qty 1

## 2016-01-26 MED ORDER — ARFORMOTEROL TARTRATE 15 MCG/2ML IN NEBU
15.0000 ug | INHALATION_SOLUTION | Freq: Two times a day (BID) | RESPIRATORY_TRACT | Status: DC
  Administered 2016-01-26 – 2016-01-27 (×3): 15 ug via RESPIRATORY_TRACT
  Filled 2016-01-26 (×3): qty 2

## 2016-01-26 MED ORDER — METOPROLOL TARTRATE 25 MG PO TABS
25.0000 mg | ORAL_TABLET | Freq: Two times a day (BID) | ORAL | Status: DC
  Administered 2016-01-26 – 2016-01-27 (×4): 25 mg via ORAL
  Filled 2016-01-26 (×4): qty 1

## 2016-01-26 MED ORDER — SODIUM CHLORIDE 0.9% FLUSH: 3.0000 mL | INTRAVENOUS | Status: DC | PRN

## 2016-01-26 MED ORDER — IPRATROPIUM-ALBUTEROL 0.5-2.5 (3) MG/3ML IN SOLN
3.0000 mL | Freq: Three times a day (TID) | RESPIRATORY_TRACT | Status: DC
  Administered 2016-01-26: 3 mL via RESPIRATORY_TRACT
  Filled 2016-01-26: qty 3

## 2016-01-26 MED ORDER — LORAZEPAM 1 MG PO TABS
2.0000 mg | ORAL_TABLET | Freq: Once | ORAL | Status: AC
  Filled 2016-01-26: qty 2

## 2016-01-26 MED ORDER — PREDNISONE 20 MG PO TABS
40.0000 mg | ORAL_TABLET | Freq: Every day | ORAL | Status: DC
  Administered 2016-01-26 – 2016-01-27 (×2): 40 mg via ORAL
  Filled 2016-01-26 (×2): qty 2

## 2016-01-26 MED ORDER — DEXTROSE 5 % IV SOLN
2.0000 g | INTRAVENOUS | Status: DC
  Administered 2016-01-26: 2 g via INTRAVENOUS
  Filled 2016-01-26 (×2): qty 2

## 2016-01-26 MED ORDER — ISOSORBIDE DINITRATE 10 MG PO TABS
20.0000 mg | ORAL_TABLET | Freq: Three times a day (TID) | ORAL | Status: DC
  Administered 2016-01-26 – 2016-01-27 (×6): 20 mg via ORAL
  Filled 2016-01-26 (×6): qty 2

## 2016-01-26 MED ORDER — NITROGLYCERIN 0.4 MG SL SUBL: 0.4000 mg | SUBLINGUAL_TABLET | SUBLINGUAL | Status: DC | PRN

## 2016-01-26 MED ORDER — ATORVASTATIN CALCIUM 80 MG PO TABS
80.0000 mg | ORAL_TABLET | Freq: Every day | ORAL | Status: DC
  Administered 2016-01-26: 80 mg via ORAL
  Filled 2016-01-26 (×2): qty 1

## 2016-01-26 MED ORDER — GUAIFENESIN ER 600 MG PO TB12
600.0000 mg | ORAL_TABLET | Freq: Two times a day (BID) | ORAL | Status: DC
  Administered 2016-01-26 – 2016-01-27 (×4): 600 mg via ORAL
  Filled 2016-01-26 (×4): qty 1

## 2016-01-26 MED ORDER — FUROSEMIDE 10 MG/ML IJ SOLN
40.0000 mg | Freq: Two times a day (BID) | INTRAMUSCULAR | Status: DC
  Administered 2016-01-26 (×2): 40 mg via INTRAVENOUS
  Filled 2016-01-26: qty 4

## 2016-01-26 MED ORDER — ALBUTEROL SULFATE (2.5 MG/3ML) 0.083% IN NEBU: 5.0000 mg | INHALATION_SOLUTION | RESPIRATORY_TRACT | Status: AC | PRN

## 2016-01-26 MED ORDER — ONDANSETRON HCL 4 MG/2ML IJ SOLN: 4.0000 mg | Freq: Four times a day (QID) | INTRAMUSCULAR | Status: DC | PRN

## 2016-01-26 MED ORDER — FUROSEMIDE 10 MG/ML IJ SOLN
20.0000 mg | Freq: Four times a day (QID) | INTRAMUSCULAR | Status: DC
  Administered 2016-01-26 (×2): 20 mg via INTRAVENOUS
  Filled 2016-01-26 (×2): qty 2

## 2016-01-26 MED ORDER — ALBUTEROL SULFATE (2.5 MG/3ML) 0.083% IN NEBU
5.0000 mg | INHALATION_SOLUTION | RESPIRATORY_TRACT | Status: AC | PRN
  Administered 2016-01-26: 5 mg via RESPIRATORY_TRACT
  Filled 2016-01-26: qty 6

## 2016-01-26 MED ORDER — MORPHINE SULFATE (PF) 2 MG/ML IV SOLN: 2.0000 mg | INTRAVENOUS | Status: DC | PRN

## 2016-01-26 MED ORDER — ONDANSETRON HCL 4 MG/2ML IJ SOLN: 4.0000 mg | Freq: Three times a day (TID) | INTRAMUSCULAR | Status: AC | PRN

## 2016-01-26 MED ORDER — SODIUM CHLORIDE 0.9% FLUSH
3.0000 mL | Freq: Two times a day (BID) | INTRAVENOUS | Status: DC
  Administered 2016-01-26 – 2016-01-27 (×4): 3 mL via INTRAVENOUS

## 2016-01-26 MED ORDER — ISOSORBIDE DINITRATE 10 MG PO TABS: 20.0000 mg | ORAL_TABLET | Freq: Three times a day (TID) | ORAL | Status: DC

## 2016-01-26 MED ORDER — LORAZEPAM 1 MG PO TABS
1.0000 mg | ORAL_TABLET | Freq: Every evening | ORAL | Status: DC | PRN
  Administered 2016-01-26 (×2): 2 mg via ORAL
  Filled 2016-01-26: qty 2

## 2016-01-26 MED ORDER — ENOXAPARIN SODIUM 40 MG/0.4ML ~~LOC~~ SOLN
40.0000 mg | Freq: Every day | SUBCUTANEOUS | Status: DC
  Administered 2016-01-26 – 2016-01-27 (×2): 40 mg via SUBCUTANEOUS
  Filled 2016-01-26 (×2): qty 0.4

## 2016-01-26 MED ORDER — ASPIRIN EC 81 MG PO TBEC
81.0000 mg | DELAYED_RELEASE_TABLET | Freq: Every day | ORAL | Status: DC
  Administered 2016-01-26 – 2016-01-27 (×2): 81 mg via ORAL
  Filled 2016-01-26 (×2): qty 1

## 2016-01-26 MED ORDER — LISINOPRIL 5 MG PO TABS
5.0000 mg | ORAL_TABLET | Freq: Every day | ORAL | Status: DC
  Administered 2016-01-26 – 2016-01-27 (×2): 5 mg via ORAL
  Filled 2016-01-26 (×2): qty 1

## 2016-01-26 MED ORDER — SODIUM CHLORIDE 0.9 % IV SOLN
250.0000 mL | INTRAVENOUS | Status: DC | PRN
Start: 2016-01-26 — End: 2016-01-27

## 2016-01-26 NOTE — Progress Notes (Addendum)
[facility] PROGRESS NOTE  Thailan Lagreca Peters [id number] DOB: 1946/06/29 DOA: 01/25/2016 PCP: Cathlean Cower, MD  Brief Summary  Mr. Jesse Hansen is a 70 year old male with past medical history significant for HTN, HLD, DM, CAD s/p CABG in 2013 with ongoing angina, ongoing tobacco abuse; who presents with progressively worsening cough, fever up to 101.31F, congestion, chest tightness, and shortness of breath.  At baseline, he has frequent angina and takes nitroglycerin several times a day.  Due to mild dementia, he is a poor historian and his wife is unsure if he has been taking his nitroglycerin more frequently than before.  He has "hardened arteries" and per her report, although he has blockages, the anatomy was difficult and therefore, his cardiologist in [place] told them he would have to be medically managed.  Chest x-ray shows bronchitis versus interstitial pneumonia with mild edema.  Patient found to have elevated troponin at 0.06 with BNP elevated at 453 on admission. EKG showing sinus rhythm with a left bundle branch block, but no previous tracing to visually compare. Patient was started on antibiotics Rocephin and azithromycin for community-acquired pneumonia.   Assessment/Plan  Suspected community-acquired pneumonia: Patient reports that prolonged history of cough that progressively worsened in the last few days. Patient presents with fever up to 101.31F. Initial influenza screen was negative and chest x-ray showed signs of possible pneumonia versus bronchitis with subtle signs of interstitial edema. - Sputum cultures -  Blood culture to be drawn -  Continue ceftriaxone and azithromycin IV  Elevated troponin, likely demand ischemia secondary to his pneumonia, but certainly at risk for ACS: Patient's initial troponin on admission was 0.06. Patient with signs of a left bundle branch block on EKG. No previous visual tracing to compare only text from EKG in 01/2011 that does not note left bundle  branch block.  Currently chest pain free.  -  Troponin 0.09 -  Repeat one more troponin to ensure trending down at 2pm -  ECHO -  Cardiology consult was placed yesterday -  Wife bringing OSH records.   Acute congestive heart failure: Patient noted having progressively worsening abdominal swelling over the last few weeks. Patient was found have elevated BNP of 453.6  - Strict ins and outs and daily weights:  -738mL - change to lasix 40mg  IV BID - Echocardiogram pending  Possible COPD exacerbation - Albuterol as needed for wheezing or shortness of breath - Brovana and budesonide nebulizers every 12 hours - start prednisone 40mg  daily x 5 days  Coronary artery disease s/p Cabg in 2013 with stable? Angina - Continue aspirin and Plavix, BB, and high dose statin -  Continue isosorbide dinitrate  Essential hypertension -Continue lisinopril and metoprolol  Diabetes mellitus type 2 - Check hemoglobin A1c - Hold metformin and glipizide while hospitalized - CBGs every before meals and at bedtime with sensitive sliding scale of insulin - Hypoglycemic protocols  Tobacco abuse: Patient reports still smoking at least 3 cigarettes per day. Requests to be placed on oxygen instead of nicotine patch as this helps him to stop smoking while hospitalized. - Allow patient to have nasal cannula oxygen as needed  Insomnia - Continue Ativan   Memory loss -  Wife rescheduling appointment to evaluate for dementia  Diet:  Diabetic/healthy heart Access:  PIV IVF:  Off  Proph:  Lovenox  Code Status: Full code Family Communication: Patient and his wife Disposition Plan: pending improvement in fevers, blood cultures negative at least 24 hours.  Home in 2 days  Consultants:  Cardiology  Procedures:  CXR  Antibiotics:  Ceftriaxone 3/5 >  Azithromycin 3/5 >    HPI/Subjective:  Breathing has improved.  Still has cough and wheeze.  Productive of phlegm.  Opening up.  Uses NTG frequently  at home and has been having chest tightness at rest and with exertion over last two weeks.      Objective: Filed Vitals:   01/26/16 0419 01/26/16 0423 01/26/16 0426 01/26/16 0801  BP: 162/78 162/83 172/80   Pulse: 78 77 80   Temp: 101.4 F (38.6 C)     TempSrc: Oral     Resp: 16     Height:      Weight:      SpO2: 98%   92%    Intake/Output Summary (Last 24 hours) at 01/26/16 1118 Last data filed at 01/26/16 1030  Gross per 24 hour  Intake   1070 ml  Output   2100 ml  Net  -1030 ml   Filed Weights   01/25/16 1910 01/26/16 0021  Weight: 87.544 kg (193 lb) 84.823 kg (187 lb)   Body mass index is 25.36 kg/(m^2).  Exam:   General:  Adult male, No acute distress  HEENT:  NCAT, MMM  Cardiovascular:  RRR, nl S1, S2 no mrg, 2+ pulses, warm extremities  Respiratory:  Full exp wheeze, diminished bilateral BS, diffuse low volume/faint rales at bases, no rhonchi, no increased WOB  Abdomen:   NABS, soft, NT/ND  MSK:   Normal tone and bulk, no LEE  Neuro:  Grossly moves all extremities.   Data Reviewed: Basic Metabolic Panel:  Recent Labs Lab 01/25/16 2025  NA 134*  K 3.8  CL 100*  CO2 24  GLUCOSE 116*  BUN 12  CREATININE 0.98  CALCIUM 9.1   Liver Function Tests: No results for input(s): AST, ALT, ALKPHOS, BILITOT, PROT, ALBUMIN in the last 168 hours. No results for input(s): LIPASE, AMYLASE in the last 168 hours. No results for input(s): AMMONIA in the last 168 hours. CBC:  Recent Labs Lab 01/25/16 2025 01/26/16 0832  WBC 7.6 7.0  NEUTROABS 5.9 5.4  HGB 14.0 14.4  HCT 41.1 42.7  MCV 86.7 87.3  PLT 183 178    Recent Results (from the past 240 hour(s))  Culture, blood (routine x 2)     Status: None (Preliminary result)   Collection Time: 01/26/16  8:32 AM  Result Value Ref Range Status   Specimen Description BLOOD LEFT ARM  Final   Special Requests BOTTLES DRAWN AEROBIC AND ANAEROBIC 5CC  Final   Culture PENDING  Incomplete   Report Status  PENDING  Incomplete     Studies: Dg Chest 2 View  01/25/2016  CLINICAL DATA:  Cough, fever and chest pain for 2 days. EXAM: CHEST  2 VIEW COMPARISON:  01/27/2015 FINDINGS: Stable surgical changes from bypass surgery. The heart is normal in size. There is mild tortuosity and calcification of the thoracic aorta. The lungs demonstrate peribronchial thickening and increased interstitial markings which suggest bronchitis or interstitial pneumonitis. Small left hiatal hernia with some adjacent atelectasis. No pleural effusion. There are thickened peripheral lower lobe interstitial lines (Kerley B-lines. Could not exclude mild interstitial edema. IMPRESSION: 1. Findings suggest bronchitis or interstitial pneumonitis. 2. Kerley B-lines could suggest mild interstitial edema. 3. No definite infiltrates or effusions.  Left basilar atelectasis. Electronically Signed   By: Marijo Sanes M.D.   On: 01/25/2016 19:40    Scheduled Meds: . arformoterol  15 mcg Nebulization  BID  . aspirin EC  81 mg Oral Daily  . atorvastatin  80 mg Oral Daily  . azithromycin  500 mg Intravenous Q24H  . budesonide (PULMICORT) nebulizer solution  0.5 mg Nebulization BID  . cefTRIAXone (ROCEPHIN)  IV  2 g Intravenous Q24H  . cholecalciferol  4,000 Units Oral Daily  . clopidogrel  75 mg Oral Daily  . enoxaparin (LOVENOX) injection  40 mg Subcutaneous Daily  . furosemide  40 mg Intravenous BID  . guaiFENesin  600 mg Oral BID  . isosorbide dinitrate  20 mg Oral TID WC  . lisinopril  5 mg Oral Daily  . metoprolol tartrate  25 mg Oral BID  . predniSONE  40 mg Oral Q breakfast  . sodium chloride flush  3 mL Intravenous Q12H   Continuous Infusions:   Principal Problem:   CAP (community acquired pneumonia) Active Problems:   Essential hypertension   Diabetes ([place])   Insomnia   Elevated troponin   Chest pain   COPD exacerbation (HCC)   Acute congestive heart failure ([place])    Time spent: 30 min    Sherma Vanmetre,  [facility] Pager [phone]. If 7PM-7AM, please contact night-coverage at [email or web address], password [facility] 01/26/2016, 11:18 AM

## 2016-01-26 NOTE — Progress Notes (Signed)
*  PRELIMINARY RESULTS* Echocardiogram 2D Echocardiogram has been performed.  Leavy Cella 01/26/2016, 1:54 PM

## 2016-01-26 NOTE — H&P (Addendum)
[facility] History and Physical  Jesse Hansen [id number] DOB: 1946-11-05 DOA: 01/25/2016  Referring physician: Transfer from [facility] ED PCP: Cathlean Cower, MD   Chief Complaint: Fever and cough  HPI:  [name] is a 70 year old male with past medical history significant for HTN, HLD, DM, CAD s/p CABG, tobacco abuse; who presents with history of progressively worsening cough and fever. History is obtained by the patient himself who endores that he's had a nonproductive cough for the last 5-6 weeks that progressively worsened in the last 5 days. Reporting new symptoms including symptoms of fever up to 101.54F, congestion, chest tightness, and worsening shortness of breath. Other associated symptoms include the patient losing his breath with bending over, abdominal distention over the last few weeks, decreased appetite, 5 pound weight loss in last 5 days, and increased lethargy. They called the [facility] today as symptoms were not improving and were instructed to go to [place] for further evaluation. He continues to smoke approximately 3 cigarettes per day at this time. Patient's wife also requests to have a nicotine patch but then states that he would rather have oxygen while hospitalized as this helps him to stop smoking at least while hospitalized. Wife notes that she has many of his records at home as it has been hard to get them from the [place].  Vital signs otherwise stable. Chest x-ray shows signs of bronchitis versus interstitial pneumonia with mild edema.Patient found to have elevated troponin at 0.06 with BNP elevated at 453 on admission. EKG showing sinus rhythm with a left bundle branch block, but no previous tracing to visually compare. Patient was started on antibiotics Rocephin and azithromycin for possible community-acquired pneumonia prior to transfer.  Review of Systems  Constitutional: Positive for fever and malaise/fatigue. Negative for chills.       Decreased  appetite  HENT: Negative for tinnitus.   Eyes: Negative for double vision and photophobia.  Respiratory: Positive for cough, sputum production, shortness of breath and wheezing. Negative for hemoptysis.   Cardiovascular: Negative for leg swelling.       Chest tightness  Gastrointestinal: Negative for nausea and vomiting.       Abdominal distention  Genitourinary: Negative for urgency and frequency.  Musculoskeletal: Positive for joint pain. Negative for neck pain.  Skin: Negative for itching and rash.  Neurological: Positive for weakness. Negative for tingling and tremors.  Endo/Heme/Allergies: Negative for environmental allergies and polydipsia.  Psychiatric/Behavioral: Negative for suicidal ideas and substance abuse.     Past Medical History  Diagnosis Date  . CORONARY ARTERY DISEASE 02/23/2008    Qualifier: Diagnosis of  By: Jenny Reichmann MD, Hunt Oris   . PTSD 07/30/2010    Qualifier: Diagnosis of  By: Jenny Reichmann MD, Hunt Oris   . PERIPHERAL VASCULAR DISEASE 02/23/2008    Qualifier: Diagnosis of  By: Jenny Reichmann MD, Hunt Oris   . HYPERTENSION 08/15/2007    Qualifier: Diagnosis of  By: Dance CMA ([place]), Kim    . HYPERLIPIDEMIA 02/23/2008    Qualifier: Diagnosis of  By: Jenny Reichmann MD, Hunt Oris   . DIABETES MELLITUS, TYPE II 01/29/2011    Qualifier: Diagnosis of  By: Jenny Reichmann MD, Hunt Oris   . ANXIETY 08/23/2009    Qualifier: Diagnosis of  By: Jenny Reichmann MD, Hunt Oris INSOMNIA-SLEEP DISORDER-UNSPEC 02/23/2008    Qualifier: Diagnosis of  By: Jenny Reichmann MD, F[place] ERECTILE DYSFUNCTION 08/15/2007    Qualifier: Diagnosis of  By: Dance CMA (Progress), Kim    .  COLONIC POLYPS, HX OF 01/29/2011    Qualifier: Diagnosis of  By: Jenny Reichmann MD, Hunt Oris      Past Surgical History  Procedure Laterality Date  . Carotid endarterectomy    . Coronary stent placement  2007  . Prostate biopsy  2009      Social History:  reports that he has been smoking.  He has never used smokeless tobacco. He reports that he drinks alcohol. He reports that he does not  use illicit drugs. Where does patient live--home  and with whom if at home?Wife  Can patient participate in ADLs? Yes  Allergies  Allergen Reactions  . Codeine     Family History  Problem Relation Age of Onset  . Hypertension Father         Prior to Admission medications   Medication Sig Start Date End Date Taking? Authorizing Provider  atorvastatin (LIPITOR) 80 MG tablet Take 80 mg by mouth daily. Take 0.5 tablet at bedtime.   Yes Historical Provider, MD  Cholecalciferol 2000 units TABS Take 2 tablets by mouth daily.   Yes Historical Provider, MD  clopidogrel (PLAVIX) 75 MG tablet Take 75 mg by mouth daily.   Yes Historical Provider, MD  glipiZIDE (GLUCOTROL) 5 MG tablet Take 5 mg by mouth 2 (two) times daily before a meal. Take 30 minutes prior to meals.   Yes Historical Provider, MD  isosorbide dinitrate (ISORDIL) 20 MG tablet Take 20 mg by mouth 3 (three) times daily. Take 2 tablets by mouth every 8 hours.   Yes Historical Provider, MD  lisinopril (PRINIVIL,ZESTRIL) 5 MG tablet Take 5 mg by mouth daily. Take 0.5 tablet every morning.   Yes Historical Provider, MD  metFORMIN (GLUCOPHAGE) 500 MG tablet Take 500 mg by mouth 2 (two) times daily with a meal.   Yes Historical Provider, MD  nitroGLYCERIN (NITROSTAT) 0.4 MG SL tablet Place 0.4 mg under the tongue every 5 (five) minutes as needed for chest pain.   Yes Historical Provider, MD  aspirin EC 81 MG tablet Take 1 tablet (81 mg total) by mouth daily. 05/15/14   Biagio Borg, MD  lisinopril (PRINIVIL,ZESTRIL) 10 MG tablet Take 1 tablet (10 mg total) by mouth daily. Patient taking differently: Take 5 mg by mouth daily. Take 0.5 tablet every morning 05/15/14   Biagio Borg, MD  LORazepam (ATIVAN) 1 MG tablet TAKE TWO (2) TABLETS BY MOUTH AT [facility] NEEDED 10/21/15   Biagio Borg, MD  lovastatin (MEVACOR) 20 MG tablet Take 1 tablet (20 mg total) by mouth at bedtime. 05/15/14   Biagio Borg, MD  metoprolol tartrate (LOPRESSOR) 25 MG  tablet 1/2 tab by mouth twice per day 03/04/12   Biagio Borg, MD     Physical Exam: Filed Vitals:   01/25/16 2130 01/25/16 2200 01/25/16 2306 01/26/16 0021  BP: 159/77 145/77 145/76 159/78  Pulse: 67 67 72 70  Temp:    99.1 F (37.3 C)  TempSrc:    Oral  Resp: 22 15 15 16   Height:    6' (1.829 m)  Weight:    84.823 kg (187 lb)  SpO2: 95% 95% 96% 96%     Constitutional: Vital signs reviewed. Patient is a well-developed and well-nourished in no acute distress and cooperative with exam. Alert and oriented x3.  Head: Normocephalic and atraumatic  Ear: TM normal bilaterally  Mouth: no erythema or exudates, MMM  Eyes: PERRL, EOMI, conjunctivae normal, No scleral icterus.  Neck: Supple, Trachea midline normal  ROM, No JVD, mass, thyromegaly, or carotid bruit present.  Cardiovascular: RRR, S1 normal, S2 normal, no MRG, pulses symmetric and intact bilaterally  Pulmonary/Chest: Barreled chest wall appearance Decreased overall air movement with fine crackles appreciated scattered throughout both lung fields and expiratory wheezes.. Abdominal: Soft. Non-tender, Moderately distended, bowel sounds are normal, no masses, organomegaly, or guarding present.  GU: no CVA tenderness Musculoskeletal: No joint deformities, erythema, or stiffness, ROM full and no nontender Ext: no edema and no cyanosis, pulses palpable bilaterally (DP and PT)  Hematology: no cervical, inginal, or axillary adenopathy.  Neurological: A&O x3, Strenght is normal and symmetric bilaterally, cranial nerve II-XII are grossly intact, no focal motor deficit, sensory intact to light touch bilaterally.  Skin: Warm, dry and intact. No rash, cyanosis, or clubbing.  Psychiatric: Normal mood and affect. speech and behavior is normal. Judgment and thought content normal. Cognition and memory are normal.      Data Review   Micro Results No results found for this or any previous visit (from the past 240 hour(s)).  Radiology  Reports Dg Chest 2 View  01/25/2016  CLINICAL DATA:  Cough, fever and chest pain for 2 days. EXAM: CHEST  2 VIEW COMPARISON:  01/27/2015 FINDINGS: Stable surgical changes from bypass surgery. The heart is normal in size. There is mild tortuosity and calcification of the thoracic aorta. The lungs demonstrate peribronchial thickening and increased interstitial markings which suggest bronchitis or interstitial pneumonitis. Small left hiatal hernia with some adjacent atelectasis. No pleural effusion. There are thickened peripheral lower lobe interstitial lines (Kerley B-lines. Could not exclude mild interstitial edema. IMPRESSION: 1. Findings suggest bronchitis or interstitial pneumonitis. 2. Kerley B-lines could suggest mild interstitial edema. 3. No definite infiltrates or effusions.  Left basilar atelectasis. Electronically Signed   By: Marijo Sanes M.D.   On: 01/25/2016 19:40     CBC  Recent Labs Lab 01/25/16 2025  WBC 7.6  HGB 14.0  HCT 41.1  PLT 183  MCV 86.7  MCH 29.5  MCHC 34.1  RDW 14.1  LYMPHSABS 1.0  MONOABS 0.6  EOSABS 0.1  BASOSABS 0.0    Chemistries   Recent Labs Lab 01/25/16 2025  NA 134*  K 3.8  CL 100*  CO2 24  GLUCOSE 116*  BUN 12  CREATININE 0.98  CALCIUM 9.1   ------------------------------------------------------------------------------------------------------------------ estimated creatinine clearance is 75.9 mL/min (by C-G formula based on Cr of 0.98). ------------------------------------------------------------------------------------------------------------------ No results for input(s): HGBA1C in the last 72 hours. ------------------------------------------------------------------------------------------------------------------ No results for input(s): CHOL, HDL, LDLCALC, TRIG, CHOLHDL, LDLDIRECT in the last 72 hours. ------------------------------------------------------------------------------------------------------------------ No results for  input(s): TSH, T4TOTAL, T63FREE, THYROIDAB in the last 72 hours.  Invalid input(s): FREET3 ------------------------------------------------------------------------------------------------------------------ No results for input(s): VITAMINB12, FOLATE, FERRITIN, TIBC, IRON, RETICCTPCT in the last 72 hours.  Coagulation profile No results for input(s): INR, PROTIME in the last 168 hours.  No results for input(s): DDIMER in the last 72 hours.  Cardiac Enzymes  Recent Labs Lab 01/25/16 2025  TROPONINI 0.06*   ------------------------------------------------------------------------------------------------------------------ Invalid input(s): POCBNP   CBG: No results for input(s): GLUCAP in the last 168 hours.     EKG: Independently reviewed. Sinus rhythm with a left bundle branch block   Assessment/Plan Suspected community-acquired pneumonia: Patient reports that prolonged history of cough that progressively worsened in the last few days. Patient presents with fever up to 101.63F. Initial influenza screen was negative and chest x-ray showed signs of possible pneumonia versus bronchitis with subtle signs of interstitial edema. - Admit to telemetry  bed - Respiratory viral panel - Follow-up sputum cultures - Empiric antibiotics of ceftriaxone and azithromycin IV - Mucinex - Tylenol for fever - May need a CT of the chest for further evaluation given tobacco history at some point in time  Elevated troponin: Patient's initial troponin on admission was 0.06. Patient with signs of a left bundle branch block on EKG. No previous visual tracing to compare only text from EKG in 01/2011 that does not note left bundle branch block. - Trend cardiac enzymes q 3 hrs x 3 - cardiology consult in a.m. Message already sent to [facility]   Acute congestive heart failure: Patient noted having progressively worsening abdominal swelling over the last few weeks. Patient was found have elevated BNP of  453.6  - Strict ins and outs and daily weights - Furosemide 20 mg IV every 6 hours - Echocardiogram completed in a.m.  Possible COPD exacerbation - Albuterol as needed for wheezing or shortness of breath - Brovana and budesonide nebulizers every 12 hours  Coronary artery disease s/p Cabg in 2013. Patient's significant other reports that she has copies of all records and is able to bring them as he goes to the [facility] hospital. - Continue aspirin and Plavix   Essential hypertension -Continue lisinopril and metoprolol  Diabetes mellitus type 2 - Check hemoglobin A1c - Hold metformin and glipizide while hospitalized - CBGs every before meals and at bedtime with sensitive sliding scale of insulin - Hypoglycemic protocols  Tobacco abuse: Patient reports still smoking at least 3 cigarettes per day. Requests to be placed on oxygen instead of nicotine patch as this helps him to stop smoking while hospitalized. - Allow patient to have nasal cannula oxygen as needed  Insomnia - Continue Ativan   Lovenox for DVT prophylaxis  Code Status:   full Family Communication: bedside Disposition Plan: admit   Total time spent 55 minutes.Greater than 50% of this time was spent in counseling, explanation of diagnosis, planning of further management, and coordination of care  [facility] Pager [phone]  If 7PM-7AM, please contact night-coverage [email or web address] Password [email or web address] 01/26/2016, 12:56 AM

## 2016-01-26 NOTE — Consult Note (Signed)
Reason for Consult:   CHF, elevated Troponin  Requesting Physician: [facility] Primary Cardiologist Dr Fanny Bien [facility]  HPI:   70 y/o Caucasian male- Jesse Hansen, served in [employer] [phone]. 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 He underwent CABG in [place]  (? 2013). He was cathed at the [place] one year later for chest pain and treated medically with good results. He has been followed by the [facility] and Dr Marijo File. The pt denies ever being told he has CHF. He recently called the [place] with complaints of cough and fever. He was instructed to monitor his symptoms presuming he had bronchitis. The pt was not satisfied with this and contacted Dr Charlann Boxer office. He was told there were no beds available in [facility] and that he should go to [facility] ED.  He was admitted 01/25/16 with CAP and CHF. His BNP is 436 and he has mild CHF on CXR. His Troponin has risen to 0.09. He denied any ongoing chest pain or SOB to me this am.   He currently sees a Film/video editor through the [place] in [place].  PMHx:  Past Medical History  Diagnosis Date  . Coronary artery disease involving native coronary artery with angina pectoris ([place]) 02/23/2008    s/p Multiple PCIs; CABG x 3 in 2013 (SVG-OM1, SVG-Diag with freeLIMA-LAD from SVG-Diag hood) --> relook cath in ~2015 with at least 1 graft ~occluded by report  . PTSD 07/30/2010    Qualifier: Diagnosis of  By: Jenny Reichmann MD, Hunt Oris   . PERIPHERAL VASCULAR DISEASE 02/23/2008    Qualifier: Diagnosis of  By: Jenny Reichmann MD, Hunt Oris   . HYPERTENSION 08/15/2007    Qualifier: Diagnosis of  By: Dance CMA ([place]), Kim    . HYPERLIPIDEMIA 02/23/2008    Qualifier: Diagnosis of  By: Jenny Reichmann MD, Hunt Oris   . DIABETES MELLITUS, TYPE II 01/29/2011    Qualifier: Diagnosis of  By: Jenny Reichmann MD, Hunt Oris   . ANXIETY 08/23/2009    Qualifier: Diagnosis of  By: Jenny Reichmann MD, Hunt Oris  INSOMNIA-SLEEP DISORDER-UNSPEC 02/23/2008    Qualifier: Diagnosis of  By: Jenny Reichmann MD, [place] ERECTILE DYSFUNCTION 08/15/2007    Qualifier: Diagnosis of  By: Dance CMA ([place]), Kim    . COLONIC POLYPS, HX OF 01/29/2011    Qualifier: Diagnosis of  By: Jenny Reichmann MD, Hunt Oris     Past Surgical History  Procedure Laterality Date  . Carotid endarterectomy    . Coronary stent placement  2007  . Prostate biopsy  2009  . Coronary artery bypass graft  2013    @ [place], [place] ([place] was contaminated) -- SVG-Diag with freeLIMA-LAD sewn to SVG hood, SVG-OM1    SOCHx:  reports that he has been smoking.  He has never used smokeless tobacco. He reports that he drinks alcohol. He reports that he does not use illicit drugs.  FAMHx: Family History  Problem Relation Age of Onset  . Hypertension Father     ALLERGIES: Allergies  Allergen Reactions  . Codeine     ROS: Review of Systems: General: negative for chills, fever, night sweats or weight changes.  Cardiovascular: negative for chest pain, dyspnea on exertion, edema, orthopnea, palpitations, paroxysmal nocturnal dyspnea or shortness of breath HEENT: negative  for any visual disturbances, blindness, glaucoma Dermatological: negative for rash Respiratory: negative for cough, hemoptysis, or wheezing Urologic: negative for hematuria or dysuria Abdominal: negative for nausea, vomiting, diarrhea, bright red blood per rectum, melena, or hematemesis Neurologic: negative for visual changes, syncope, or dizziness Musculoskeletal: negative for back pain, joint pain, or swelling Psych: cooperative and appropriate All other systems reviewed and are otherwise negative except as noted above.   HOME MEDICATIONS: Prior to Admission medications   Medication Sig Start Date End Date Taking? Authorizing Provider  aspirin EC 81 MG tablet Take 1 tablet (81 mg total) by mouth daily. 05/15/14  Yes Biagio Borg, MD  atorvastatin  (LIPITOR) 80 MG tablet Take 80 mg by mouth daily. Take 0.5 tablet at bedtime.   Yes Historical Provider, MD  Cholecalciferol 2000 units TABS Take 2 tablets by mouth daily.   Yes Historical Provider, MD  clopidogrel (PLAVIX) 75 MG tablet Take 75 mg by mouth daily.   Yes Historical Provider, MD  glipiZIDE (GLUCOTROL) 5 MG tablet Take 5 mg by mouth 2 (two) times daily before a meal. Take 30 minutes prior to meals.   Yes Historical Provider, MD  isosorbide dinitrate (ISORDIL) 20 MG tablet Take 20 mg by mouth 3 (three) times daily.    Yes Historical Provider, MD  lisinopril (PRINIVIL,ZESTRIL) 2.5 MG tablet Take 2.5 mg by mouth daily.   Yes Historical Provider, MD  lisinopril (PRINIVIL,ZESTRIL) 5 MG tablet Take 5 mg by mouth daily. Take 0.5 tablet every morning.   Yes Historical Provider, MD  LORazepam (ATIVAN) 1 MG tablet TAKE TWO (2) TABLETS BY MOUTH AT [facility] NEEDED 10/21/15  Yes Biagio Borg, MD  metFORMIN (GLUCOPHAGE) 500 MG tablet Take 500 mg by mouth 2 (two) times daily with a meal.   Yes Historical Provider, MD  metoprolol tartrate (LOPRESSOR) 25 MG tablet 1/2 tab by mouth twice per day 03/04/12  Yes Biagio Borg, MD  naproxen (NAPROSYN) 500 MG tablet Take 500 mg by mouth 2 (two) times daily as needed for mild pain or moderate pain.   Yes Historical Provider, MD  nitroGLYCERIN (NITROSTAT) 0.4 MG SL tablet Place 0.4 mg under the tongue every 5 (five) minutes as needed for chest pain.   Yes Historical Provider, MD  omeprazole (PRILOSEC) 20 MG capsule Take 20 mg by mouth daily as needed (heartburn).   Yes Historical Provider, MD  ranitidine (ZANTAC) 150 MG tablet Take 150 mg by mouth 2 (two) times daily as needed for heartburn.   Yes Historical Provider, MD    HOSPITAL MEDICATIONS: I have reviewed the patient's current medications.  VITALS: Blood pressure 119/62, pulse 59, temperature 98.6 F (37 C), temperature source Oral, resp. rate 16, height 6' (1.829 m), weight 187 lb (84.823 kg), SpO2 97  %.  PHYSICAL EXAM: General appearance: alert, cooperative and no distress; circumferential speech HEENT: [place]/AT, EOMI, MMM, anicteric sclera Neck: no carotid bruit, no JVD and LCE scar Lungs: decreased breath sounds, no rales or wheezing Heart: regular rate and rhythm and decreased heart sounds Abdomen: midline surgical scar, ventral hernia Extremities: no edema Pulses: bi femoral bruits, decreased distal pulses Skin: warm and dry Neurologic: Grossly normal  LABS: Results for orders placed or performed during the hospital encounter of 01/25/16 (from the past 24 hour(s))  Basic metabolic panel     Status: Abnormal   Collection Time: 01/25/16  8:25 PM  Result Value Ref Range   Sodium 134 (L) 135 - 145 mmol/L   Potassium 3.8 3.5 -  5.1 mmol/L   Chloride 100 (L) 101 - 111 mmol/L   CO2 24 22 - 32 mmol/L   Glucose, Bld 116 (H) 65 - 99 mg/dL   BUN 12 6 - 20 mg/dL   Creatinine, Ser 0.98 0.61 - 1.24 mg/dL   Calcium 9.1 8.9 - 10.3 mg/dL   GFR calc non Af Amer >60 >60 mL/min   GFR calc Af Amer >60 >60 mL/min   Anion gap 10 5 - 15  Troponin I     Status: Abnormal   Collection Time: 01/25/16  8:25 PM  Result Value Ref Range   Troponin I 0.06 (H) <0.031 ng/mL  Brain natriuretic peptide     Status: Abnormal   Collection Time: 01/25/16  8:25 PM  Result Value Ref Range   B Natriuretic Peptide 453.6 (H) 0.0 - 100.0 pg/mL  CBC with Differential     Status: None   Collection Time: 01/25/16  8:25 PM  Result Value Ref Range   WBC 7.6 4.0 - 10.5 K/uL   RBC 4.74 4.22 - 5.81 MIL/uL   Hemoglobin 14.0 13.0 - 17.0 g/dL   HCT 41.1 39.0 - 52.0 %   MCV 86.7 78.0 - 100.0 fL   MCH 29.5 26.0 - 34.0 pg   MCHC 34.1 30.0 - 36.0 g/dL   RDW 14.1 11.5 - 15.5 %   Platelets 183 150 - 400 K/uL   Neutrophils Relative % 78 %   Neutro Abs 5.9 1.7 - 7.7 K/uL   Lymphocytes Relative 13 %   Lymphs Abs 1.0 0.7 - 4.0 K/uL   Monocytes Relative 8 %   Monocytes Absolute 0.6 0.1 - 1.0 K/uL   Eosinophils Relative 1 %     Eosinophils Absolute 0.1 0.0 - 0.7 K/uL   Basophils Relative 0 %   Basophils Absolute 0.0 0.0 - 0.1 K/uL  Influenza panel by PCR (type A & B, H1N1)     Status: None   Collection Time: 01/25/16  9:38 PM  Result Value Ref Range   Influenza A By PCR NEGATIVE NEGATIVE   Influenza B By PCR NEGATIVE NEGATIVE   H1N1 flu by pcr NOT DETECTED NOT DETECTED  Glucose, capillary     Status: Abnormal   Collection Time: 01/26/16 12:58 AM  Result Value Ref Range   Glucose-Capillary 123 (H) 65 - 99 mg/dL  HIV antibody     Status: None   Collection Time: 01/26/16  1:24 AM  Result Value Ref Range   HIV Screen 4th Generation wRfx Non Reactive Non Reactive  Troponin I-serum (0, 3, 6 hours)     Status: Abnormal   Collection Time: 01/26/16  1:24 AM  Result Value Ref Range   Troponin I 0.06 (H) <0.031 ng/mL  Strep pneumoniae urinary antigen     Status: None   Collection Time: 01/26/16  2:20 AM  Result Value Ref Range   Strep Pneumo Urinary Antigen NEGATIVE NEGATIVE  TSH     Status: None   Collection Time: 01/26/16  4:51 AM  Result Value Ref Range   TSH 2.187 0.350 - 4.500 uIU/mL  Lipid panel     Status: Abnormal   Collection Time: 01/26/16  4:51 AM  Result Value Ref Range   Cholesterol 189 0 - 200 mg/dL   Triglycerides 121 <150 mg/dL   HDL 30 (L) >40 mg/dL   Total CHOL/HDL Ratio 6.3 RATIO   VLDL 24 0 - 40 mg/dL   LDL Cholesterol 135 (H) 0 - 99 mg/dL  Troponin I-serum (0, 3, 6 hours)     Status: Abnormal   Collection Time: 01/26/16  4:57 AM  Result Value Ref Range   Troponin I 0.07 (H) <0.031 ng/mL  Troponin I-serum (0, 3, 6 hours)     Status: Abnormal   Collection Time: 01/26/16  8:32 AM  Result Value Ref Range   Troponin I 0.09 (H) <0.031 ng/mL  CBC with Differential/Platelet     Status: None   Collection Time: 01/26/16  8:32 AM  Result Value Ref Range   WBC 7.0 4.0 - 10.5 K/uL   RBC 4.89 4.22 - 5.81 MIL/uL   Hemoglobin 14.4 13.0 - 17.0 g/dL   HCT 42.7 39.0 - 52.0 %   MCV 87.3 78.0 -  100.0 fL   MCH 29.4 26.0 - 34.0 pg   MCHC 33.7 30.0 - 36.0 g/dL   RDW 14.3 11.5 - 15.5 %   Platelets 178 150 - 400 K/uL   Neutrophils Relative % 77 %   Neutro Abs 5.4 1.7 - 7.7 K/uL   Lymphocytes Relative 15 %   Lymphs Abs 1.1 0.7 - 4.0 K/uL   Monocytes Relative 7 %   Monocytes Absolute 0.5 0.1 - 1.0 K/uL   Eosinophils Relative 1 %   Eosinophils Absolute 0.0 0.0 - 0.7 K/uL   Basophils Relative 0 %   Basophils Absolute 0.0 0.0 - 0.1 K/uL  Culture, blood (routine x 2)     Status: None (Preliminary result)   Collection Time: 01/26/16  8:32 AM  Result Value Ref Range   Specimen Description BLOOD LEFT ARM    Special Requests BOTTLES DRAWN AEROBIC AND ANAEROBIC 5CC    Culture PENDING    Report Status PENDING   Glucose, capillary     Status: Abnormal   Collection Time: 01/26/16 11:18 AM  Result Value Ref Range   Glucose-Capillary 174 (H) 65 - 99 mg/dL   Comment 1 Notify RN   Troponin I     Status: Abnormal   Collection Time: 01/26/16  4:11 PM  Result Value Ref Range   Troponin I 0.06 (H) <0.031 ng/mL    EKG: NSR LBBB  IMAGING: Dg Chest 2 View  01/25/2016  CLINICAL DATA:  Cough, fever and chest pain for 2 days. EXAM: CHEST  2 VIEW COMPARISON:  01/27/2015 FINDINGS: Stable surgical changes from bypass surgery. The heart is normal in size. There is mild tortuosity and calcification of the thoracic aorta. The lungs demonstrate peribronchial thickening and increased interstitial markings which suggest bronchitis or interstitial pneumonitis. Small left hiatal hernia with some adjacent atelectasis. No pleural effusion. There are thickened peripheral lower lobe interstitial lines ([place] B-lines. Could not exclude mild interstitial edema. IMPRESSION: 1. Findings suggest bronchitis or interstitial pneumonitis. 2. Kerley B-lines could suggest mild interstitial edema. 3. No definite infiltrates or effusions.  Left basilar atelectasis. Electronically Signed   By: Marijo Sanes M.D.   On: 01/25/2016  19:40    IMPRESSION: Principal Problem:   CAP (community acquired pneumonia) Active Problems:   Hx of CABG x 3 2013   Chronic stable angina (HCC)   Hyperlipidemia   Essential hypertension   Elevated troponin   Acute congestive heart failure ([place])   Coronary artery disease involving native coronary artery with angina pectoris (HCC)   PTSD   PERIPHERAL VASCULAR DISEASE   Diabetes ([place])   Insomnia   Chest pain   COPD exacerbation (HCC)   Agent orange exposure   LBBB (left bundle branch block)  RECOMMENDATION: MD to see. Elevated Troponin could be from CHF, though CHF appears mild. We should check an echo for LVF. The pt asked about a cath and possible PCI- this should probably be referred back to his primary cardiologist if possible.   Time Spent Directly with Patient: 38 minutes  Kerin Ransom, [place] beeper 01/26/2016, 12:07 PM   I have seen, examined and evaluated the patient this PM along with Mr. Rosalyn Gess, [place].  After reviewing all the available data and chart,  I agree with his findings, examination as well as impression recommendations.  Very compensated patient with long-standing cardiac and vascular history who has been followed up through the [place] and various private organizations. He was somewhat elevated at [facility], likely related to lack of beds at [facility] with signs and symptoms that were consistent with pneumonia. He's been having intermittent fever with cough over the last several days leading up to his admission and now is admitted with fever and elevated white blood cell count and chest x-ray suggesting pneumonia. Next a cardiology consult due to mildly elevated troponin levels and BNP level. He may very well have some mild acute on chronic heart failure in the setting of his pneumonia on forcing we don't have any prior reports of his EF or diastolic function. I agree that cardiac would be helpful.  He tells me these had symptoms that are  pretty much consistent with chronic angina that is exertional in nature. He usually well controlled with Isordil, but if he does any significant exertion, he will continue his nitroglycerin. This is of than that I recommend be followed as an outpatient with his current cardiologist. Mild troponin elevation is clearly in association with known CAD (by report he poorly has at least one graft closed) in the setting of pneumonia and potential mild CHF. I would not consider this to be demand ischemia.  I agree with the diuresis as you're doing his symptoms have improved on accommodation of medical management for pneumonia/COPD as well as diuresis.  Port existing coronary disease he remains on aspirin plus Plavix as well as statin at high dose. He remains on 3 times a day Isordil. This could probably be increased for discharge as he is having to take some nitroglycerin glycerin home. He is on low-dose metoprolol, I think he may be exceeding half a pill twice a day. At one time apparently he took a full pill and had profound bradycardia. 100 for bradycardia while inpatient. He is on lisinopril for afterload reduction - has normal renal function.  The wife is trying to get some of his cardiac records. All I had to go From the multiple papers that she brought was a discharge summary from the [facility]. I did not see any Cardiac Cath, Echo or Nuclear Stress Test  Reports.  He himself is relatively poor historian, and the wife provides most of the history. He perseverates on symptoms that are difficult to really fully understand. I think this may be the reason why his outpatient cardiologist is reluctant to do any significant evaluation. I would defer any potential ischemic evaluation to his PCP and primary cardiologist. For now I would send to treat heart failure and continue his home medications. One consideration could be to increase Isordil.    Leonie Man, M.D., M.S. Interventional  Cardiologist   Pager # [phone] Phone # [phone] [address] [place], [place] [address]

## 2016-01-27 DIAGNOSIS — I5041 Acute combined systolic (congestive) and diastolic (congestive) heart failure: Secondary | ICD-10-CM

## 2016-01-27 DIAGNOSIS — I255 Ischemic cardiomyopathy: Secondary | ICD-10-CM | POA: Diagnosis present

## 2016-01-27 DIAGNOSIS — I350 Nonrheumatic aortic (valve) stenosis: Secondary | ICD-10-CM

## 2016-01-27 DIAGNOSIS — R7989 Other specified abnormal findings of blood chemistry: Secondary | ICD-10-CM

## 2016-01-27 LAB — BASIC METABOLIC PANEL
ANION GAP: 13 (ref 5–15)
BUN: 23 mg/dL — ABNORMAL HIGH (ref 6–20)
CALCIUM: 9.5 mg/dL (ref 8.9–10.3)
CO2: 26 mmol/L (ref 22–32)
CREATININE: 1.26 mg/dL — AB (ref 0.61–1.24)
Chloride: 95 mmol/L — ABNORMAL LOW (ref 101–111)
GFR, EST NON AFRICAN AMERICAN: 56 mL/min — AB (ref >60)
Glucose, Bld: 125 mg/dL — ABNORMAL HIGH (ref 65–99)
Potassium: 3.8 mmol/L (ref 3.5–5.1)
SODIUM: 134 mmol/L — AB (ref 135–145)

## 2016-01-27 LAB — HEMOGLOBIN A1C
Hgb A1c MFr Bld: 6.8 % — ABNORMAL HIGH (ref 4.8–5.6)
MEAN PLASMA GLUCOSE: 148 mg/dL

## 2016-01-27 LAB — LEGIONELLA ANTIGEN, URINE

## 2016-01-27 LAB — CBC
HCT: 42.7 % (ref 39.0–52.0)
HEMOGLOBIN: 14.3 g/dL (ref 13.0–17.0)
MCH: 29.1 pg (ref 26.0–34.0)
MCHC: 33.5 g/dL (ref 30.0–36.0)
MCV: 87 fL (ref 78.0–100.0)
PLATELETS: 185 10*3/uL (ref 150–400)
RBC: 4.91 MIL/uL (ref 4.22–5.81)
RDW: 14.3 % (ref 11.5–15.5)
WBC: 6.7 10*3/uL (ref 4.0–10.5)

## 2016-01-27 MED ORDER — ATORVASTATIN CALCIUM 80 MG PO TABS: 80.0000 mg | ORAL_TABLET | Freq: Every day | ORAL | Status: DC

## 2016-01-27 MED ORDER — CEPHALEXIN 500 MG PO CAPS
500.0000 mg | ORAL_CAPSULE | Freq: Two times a day (BID) | ORAL | Status: DC
Start: 2016-01-27 — End: 2016-02-03

## 2016-01-27 MED ORDER — GUAIFENESIN ER 600 MG PO TB12: 600.0000 mg | ORAL_TABLET | Freq: Two times a day (BID) | ORAL | Status: DC

## 2016-01-27 MED ORDER — FUROSEMIDE 40 MG PO TABS
40.0000 mg | ORAL_TABLET | Freq: Every day | ORAL | Status: DC
  Administered 2016-01-27: 40 mg via ORAL
  Filled 2016-01-27: qty 1

## 2016-01-27 MED ORDER — IPRATROPIUM-ALBUTEROL 0.5-2.5 (3) MG/3ML IN SOLN: 3.0000 mL | Freq: Two times a day (BID) | RESPIRATORY_TRACT | Status: DC

## 2016-01-27 MED ORDER — LISINOPRIL 2.5 MG PO TABS: 5.0000 mg | ORAL_TABLET | Freq: Every day | ORAL | Status: DC

## 2016-01-27 MED ORDER — BUDESONIDE 0.5 MG/2ML IN SUSP: 0.5000 mg | Freq: Two times a day (BID) | RESPIRATORY_TRACT | Status: DC

## 2016-01-27 MED ORDER — METOPROLOL TARTRATE 25 MG PO TABS: 25.0000 mg | ORAL_TABLET | Freq: Two times a day (BID) | ORAL | Status: DC

## 2016-01-27 MED ORDER — AZITHROMYCIN 500 MG PO TABS: 500.0000 mg | ORAL_TABLET | Freq: Every day | ORAL | Status: DC

## 2016-01-27 MED ORDER — PREDNISONE 20 MG PO TABS: ORAL_TABLET | ORAL | Status: DC

## 2016-01-27 MED ORDER — FUROSEMIDE 40 MG PO TABS: 40.0000 mg | ORAL_TABLET | Freq: Every day | ORAL | Status: DC

## 2016-01-27 NOTE — Discharge Summary (Addendum)
Physician Discharge Summary  Jesse Hansen Chain [id number] DOB: [birth year] DOA: 01/25/2016  PCP: Cathlean Cower, MD  Admit date: 01/25/2016 Discharge date: 01/27/2016  Time spent: 35 minutes  Recommendations for Outpatient Follow-up:  Needs to follow up with primary cardiology for further evaluation. Due to abnormal ECHO  Needs bmet to follow renal function.   Discharge Diagnoses:    CAP (community acquired pneumonia)   COPD exacerbation ([place])   Hyperlipidemia   PTSD   Essential hypertension   PERIPHERAL VASCULAR DISEASE   Diabetes ([place])   Elevated troponin   Acute congestive heart failure (HCC)   Hx of CABG x 3 2013   Agent orange exposure   LBBB (left bundle branch block)   Chronic stable angina (HCC)   Cardiomyopathy, ischemic   Aortic stenosis, mild   Discharge Condition: stable.   Diet recommendation: Heart Healthy   Filed Weights   01/25/16 1910 01/26/16 0021 01/27/16 0557  Weight: 87.544 kg (193 lb) 84.823 kg (187 lb) 82.419 kg (181 lb 11.2 oz)    History of present illness:  [name] is a 70 year old male with past medical history significant for HTN, HLD, DM, CAD s/p CABG in 2013 with ongoing angina, ongoing tobacco abuse; who presents with progressively worsening cough, fever up to 101.75F, congestion, chest tightness, and shortness of breath. At baseline, he has frequent angina and takes nitroglycerin several times a day. Due to mild dementia, he is a poor historian and his wife is unsure if he has been taking his nitroglycerin more frequently than before. He has "hardened arteries" and per her report, although he has blockages, the anatomy was difficult and therefore, his cardiologist in [place] told them he would have to be medically managed. Chest x-ray shows bronchitis versus interstitial pneumonia with mild edema. Patient found to have elevated troponin at 0.06 with BNP elevated at 453 on admission. EKG showing sinus rhythm with a left bundle branch block, but no  previous tracing to visually compare. Patient was started on antibiotics Rocephin and azithromycin for community-acquired pneumonia.  Hospital Course:  Suspected community-acquired pneumonia: Patient reports that prolonged history of cough that progressively worsened in the last few days. Patient presents with fever up to 101.75F. Initial influenza screen was negative and chest x-ray showed signs of possible pneumonia versus bronchitis with subtle signs of interstitial edema.  - Received ceftriaxone and azithromycin IV -WBC normal, cough resolved. Discharge on keflex and Zithromax  for 5 days ';  Elevated troponin, likely demand ischemia secondary to his pneumonia, but certainly at risk for ACS: Patient's initial troponin on admission was 0.06. Patient with signs of a left bundle branch block on EKG. No previous visual tracing to compare only text from EKG in 01/2011 that does not note left bundle branch block. Currently chest pain free.  - Troponin 0.09 - ECHO Ef 40 5, hypokinesis  -no further evaluation per cardio.  -demand ischemia secondary to pna. Needs to follow up with primary cardiology   Acute congestive heart failure: Patient noted having progressively worsening abdominal swelling over the last few weeks. Patient was found have elevated BNP of 453.6  - Strict ins and outs and daily weights: -715mL - change lasix to 40 mg daily  - Echocardiogram pending  Possible COPD exacerbation - Albuterol as needed for wheezing or shortness of breath - discharge  budesonide nebulizers every 12 hours -prednisone 40mg  taper.   Coronary artery disease s/p Cabg in 2013 with stable? Angina - Continue aspirin and Plavix, BB, and high  dose statin - Continue isosorbide dinitrate  Essential hypertension -Continue lisinopril and metoprolol  Diabetes mellitus type 2 - hemoglobin A1c 6.8 - Hold metformin and glipizide while hospitalized, resume at discharge  - CBGs every before meals and at  bedtime with sensitive sliding scale of insulin  Tobacco abuse: Patient reports still smoking at least 3 cigarettes per day. Requests to be placed on oxygen instead of nicotine patch as this helps him to stop smoking while hospitalized. - Allow patient to have nasal cannula oxygen as needed  Insomnia - Continue Ativan   Memory loss - Wife rescheduling appointment to evaluate for dementia  Procedures:  none  Consultations:  Cardiology   Discharge Exam: Filed Vitals:   01/27/16 0810 01/27/16 1201  BP: 126/72 111/68  Pulse: 62 54  Temp: 98 F (36.7 C) 97.8 F (36.6 C)  Resp: 18 18    General: NAD Cardiovascular: S 1, S 2 RRR Respiratory: CTA  Discharge Instructions   Discharge Instructions    Diet - low sodium heart healthy    Complete by:  As directed      Increase activity slowly    Complete by:  As directed           Current Discharge Medication List    START taking these medications   Details  azithromycin (ZITHROMAX) 500 MG tablet Take 1 tablet (500 mg total) by mouth daily. Qty: 3 tablet, Refills: 0    budesonide (PULMICORT) 0.5 MG/2ML nebulizer solution Take 2 mLs (0.5 mg total) by nebulization 2 (two) times daily. Qty: 30 mL, Refills: 0    cephALEXin (KEFLEX) 500 MG capsule Take 1 capsule (500 mg total) by mouth 2 (two) times daily. Qty: 10 capsule, Refills: 0    furosemide (LASIX) 40 MG tablet Take 1 tablet (40 mg total) by mouth daily. Qty: 30 tablet, Refills: 0    guaiFENesin (MUCINEX) 600 MG 12 hr tablet Take 1 tablet (600 mg total) by mouth 2 (two) times daily. Qty: 30 tablet, Refills: 0    ipratropium-albuterol (DUONEB) 0.5-2.5 (3) MG/3ML SOLN Take 3 mLs by nebulization 2 (two) times daily. Qty: 360 mL, Refills: 0    predniSONE (DELTASONE) 20 MG tablet Take 2 tablets for 2 days then 1 tablet for 2  days Qty: 6 tablet, Refills: 0      CONTINUE these medications which have CHANGED   Details  lisinopril (PRINIVIL,ZESTRIL) 2.5 MG tablet  Take 2 tablets (5 mg total) by mouth daily. Qty: 30 tablet, Refills: 0    metoprolol tartrate (LOPRESSOR) 25 MG tablet Take 1 tablet (25 mg total) by mouth 2 (two) times daily. Qty: 60 tablet, Refills: 0      CONTINUE these medications which have NOT CHANGED   Details  aspirin EC 81 MG tablet Take 1 tablet (81 mg total) by mouth daily. Qty: 90 tablet, Refills: 11    atorvastatin (LIPITOR) 80 MG tablet Take 80 mg by mouth daily. Take 0.5 tablet at bedtime.    Cholecalciferol 2000 units TABS Take 2 tablets by mouth daily.    clopidogrel (PLAVIX) 75 MG tablet Take 75 mg by mouth daily.    glipiZIDE (GLUCOTROL) 5 MG tablet Take 5 mg by mouth 2 (two) times daily before a meal. Take 30 minutes prior to meals.    isosorbide dinitrate (ISORDIL) 20 MG tablet Take 20 mg by mouth 3 (three) times daily.     LORazepam (ATIVAN) 1 MG tablet TAKE TWO (2) TABLETS BY MOUTH AT [facility] NEEDED Qty:  60 tablet, Refills: 2    metFORMIN (GLUCOPHAGE) 500 MG tablet Take 500 mg by mouth 2 (two) times daily with a meal.    nitroGLYCERIN (NITROSTAT) 0.4 MG SL tablet Place 0.4 mg under the tongue every 5 (five) minutes as needed for chest pain.    omeprazole (PRILOSEC) 20 MG capsule Take 20 mg by mouth daily as needed (heartburn).    ranitidine (ZANTAC) 150 MG tablet Take 150 mg by mouth 2 (two) times daily as needed for heartburn.      STOP taking these medications     naproxen (NAPROSYN) 500 MG tablet        Allergies  Allergen Reactions  . Codeine    Follow-up Information    Follow up with Cathlean Cower, MD In 1 week.   Specialties:  Internal Medicine, Radiology   Contact information:   [place] [address] [phone]        The results of significant diagnostics from this hospitalization (including imaging, microbiology, ancillary and laboratory) are listed below for reference.    Significant Diagnostic Studies: Dg Chest 2 View  01/25/2016  CLINICAL DATA:   Cough, fever and chest pain for 2 days. EXAM: CHEST  2 VIEW COMPARISON:  01/27/2015 FINDINGS: Stable surgical changes from bypass surgery. The heart is normal in size. There is mild tortuosity and calcification of the thoracic aorta. The lungs demonstrate peribronchial thickening and increased interstitial markings which suggest bronchitis or interstitial pneumonitis. Small left hiatal hernia with some adjacent atelectasis. No pleural effusion. There are thickened peripheral lower lobe interstitial lines (Kerley B-lines. Could not exclude mild interstitial edema. IMPRESSION: 1. Findings suggest bronchitis or interstitial pneumonitis. 2. Kerley B-lines could suggest mild interstitial edema. 3. No definite infiltrates or effusions.  Left basilar atelectasis. Electronically Signed   By: Marijo Sanes M.D.   On: 01/25/2016 19:40    Microbiology: Recent Results (from the past 240 hour(s))  Culture, blood (routine x 2)     Status: None (Preliminary result)   Collection Time: 01/26/16  8:32 AM  Result Value Ref Range Status   Specimen Description BLOOD LEFT ARM  Final   Special Requests BOTTLES DRAWN AEROBIC AND ANAEROBIC 5CC  Final   Culture PENDING  Incomplete   Report Status PENDING  Incomplete     Labs: Basic Metabolic Panel:  Recent Labs Lab 01/25/16 2025 01/27/16 0431  NA 134* 134*  K 3.8 3.8  CL 100* 95*  CO2 24 26  GLUCOSE 116* 125*  BUN 12 23*  CREATININE 0.98 1.26*  CALCIUM 9.1 9.5   Liver Function Tests: No results for input(s): AST, ALT, ALKPHOS, BILITOT, PROT, ALBUMIN in the last 168 hours. No results for input(s): LIPASE, AMYLASE in the last 168 hours. No results for input(s): AMMONIA in the last 168 hours. CBC:  Recent Labs Lab 01/25/16 2025 01/26/16 0832 01/27/16 0431  WBC 7.6 7.0 6.7  NEUTROABS 5.9 5.4  --   HGB 14.0 14.4 14.3  HCT 41.1 42.7 42.7  MCV 86.7 87.3 87.0  PLT 183 178 185   Cardiac Enzymes:  Recent Labs Lab 01/25/16 2025 01/26/16 0124  01/26/16 0457 01/26/16 0832 01/26/16 1611  TROPONINI 0.06* 0.06* 0.07* 0.09* 0.06*   BNP: BNP (last 3 results)  Recent Labs  01/25/16 2025  BNP 453.6*    ProBNP (last 3 results) No results for input(s): PROBNP in the last 8760 hours.  CBG:  Recent Labs Lab 01/26/16 0058 01/26/16 1118  GLUCAP 123* 174*  Signed:  Elmarie Shiley MD.  [facility] 01/27/2016, 12:57 PM

## 2016-01-27 NOTE — Progress Notes (Signed)
Patient goes to the [facility] in [place] for medical care and gets all of his medical supplies/ DME through the [facility]. Script for nebulizer machine given to the patient and he will take it to the [facility] to get his nebulizer machine; Wife at bedside; Aneta Mins [phone]

## 2016-01-27 NOTE — Progress Notes (Signed)
Subjective:  Fells better, less coughing.  Objective:  Vital Signs in the last 24 hours: Temp:  [98 F (36.7 C)-98.8 F (37.1 C)] 98 F (36.7 C) (03/07 0810) Pulse Rate:  [59-65] 62 (03/07 0810) Resp:  [16-21] 18 (03/07 0810) BP: (119-132)/(58-72) 126/72 mmHg (03/07 0810) SpO2:  [92 %-98 %] 93 % (03/07 0828) Weight:  [181 lb 11.2 oz (82.419 kg)] 181 lb 11.2 oz (82.419 kg) (03/07 0557)  Intake/Output from previous day:  Intake/Output Summary (Last 24 hours) at 01/27/16 0835 Last data filed at 01/26/16 2339  Gross per 24 hour  Intake   1210 ml  Output   1350 ml  Net   -140 ml    Physical Exam: General appearance: alert, cooperative and no distress Lungs: decreased Rt base Heart: regular rate and rhythm and soft systolic murmur AOV Extremities: no edema Neurologic: Grossly normal   Rate: 50  Rhythm: normal sinus rhythm and sinus bradycardia  Lab Results:  Recent Labs  01/26/16 0832 01/27/16 0431  WBC 7.0 6.7  HGB 14.4 14.3  PLT 178 185    Recent Labs  01/25/16 2025 01/27/16 0431  NA 134* 134*  K 3.8 3.8  CL 100* 95*  CO2 24 26  GLUCOSE 116* 125*  BUN 12 23*  CREATININE 0.98 1.26*    Recent Labs  01/26/16 0832 01/26/16 1611  TROPONINI 0.09* 0.06*   No results for input(s): INR in the last 72 hours.  Scheduled Meds: . arformoterol  15 mcg Nebulization BID  . aspirin EC  81 mg Oral Daily  . atorvastatin  80 mg Oral Daily  . azithromycin  500 mg Intravenous Q24H  . budesonide (PULMICORT) nebulizer solution  0.5 mg Nebulization BID  . cefTRIAXone (ROCEPHIN)  IV  2 g Intravenous Q24H  . cholecalciferol  4,000 Units Oral Daily  . clopidogrel  75 mg Oral Daily  . enoxaparin (LOVENOX) injection  40 mg Subcutaneous Daily  . furosemide  40 mg Intravenous BID  . guaiFENesin  600 mg Oral BID  . ipratropium-albuterol  3 mL Nebulization BID  . isosorbide dinitrate  20 mg Oral TID WC  . lisinopril  5 mg Oral Daily  . metoprolol tartrate  25 mg Oral  BID  . predniSONE  40 mg Oral Q breakfast  . sodium chloride flush  3 mL Intravenous Q12H   Continuous Infusions:  PRN Meds:.sodium chloride, acetaminophen, gi cocktail, LORazepam, morphine injection, nitroGLYCERIN, ondansetron (ZOFRAN) IV, sodium chloride flush   Imaging: Dg Chest 2 View  01/25/2016  CLINICAL DATA:  Cough, fever and chest pain for 2 days. EXAM: CHEST  2 VIEW COMPARISON:  01/27/2015 FINDINGS: Stable surgical changes from bypass surgery. The heart is normal in size. There is mild tortuosity and calcification of the thoracic aorta. The lungs demonstrate peribronchial thickening and increased interstitial markings which suggest bronchitis or interstitial pneumonitis. Small left hiatal hernia with some adjacent atelectasis. No pleural effusion. There are thickened peripheral lower lobe interstitial lines (Kerley B-lines. Could not exclude mild interstitial edema. IMPRESSION: 1. Findings suggest bronchitis or interstitial pneumonitis. 2. Kerley B-lines could suggest mild interstitial edema. 3. No definite infiltrates or effusions.  Left basilar atelectasis. Electronically Signed   By: Marijo Sanes M.D.   On: 01/25/2016 19:40    Cardiac Studies: Echo 01/26/16 Study Conclusions  - Left ventricle: Poor acoustic windows. LVEF appears depressed at approximately 40% with hypokinesis of the lateral, mid anterior, distal inferior, distal inferoseptal and posterior walls; akinesis of the distal  anterior wall and apex. The cavity size was normal. Wall thickness was increased in a pattern of mild LVH. - Aortic valve: AV is thckened, calcified with restricted motion Peak and mean gradients through the valve are 17 and 10 mm Hg respectively. 2D imaging suggests AV is mildly restricted in opening. - Mitral valve: Calcified annulus. Mildly thickened leaflets . There was mild regurgitation.   Assessment/Plan:  70 y/o male- [place], 100% disabled from the [facility] for [employer] syndrome, with PTSD, DM, HLD, PVD- s/p prior AOBF and LCE in the '90s, and CAD s/p CABG in [place] (? 2013). He was cathed at the [facility] one year later for chest pain and treated medically with good results. He has been followed by the [facility] in [place] and by Dr Marijo File in [facility]. He was admitted to [facility] 01/25/16 with CAP and mild CHF (no beds in [facility]).  His Troponin peaked at 0.09.  He gives a history of chronic exertional angina. Echo done 3/6 shows EF 40% with mild AS.  He has diuresed 12 lbs.    Principal Problem:   CAP (community acquired pneumonia) Active Problems:   Elevated troponin   Acute congestive heart failure (HCC)   Hx of CABG x 3 2013   Essential hypertension   Diabetes (HCC)   COPD exacerbation (HCC)   Chronic stable angina (HCC)   Cardiomyopathy, ischemic   Hyperlipidemia   PTSD   PERIPHERAL VASCULAR DISEASE   Agent orange exposure   LBBB (left bundle branch block)   Aortic stenosis, mild   PLAN:  I stopped his IV Lasix. He was not on a diuretic before admission but he may need low dose added when he goes home- HCTZ 12.5 mg.  Ambulate this am[facility] [place].  Low sodium diet.   Kerin Ransom PA-C 01/27/2016, 8:35 AM [phone]  I have seen, examined and evaluated the patient this AM along with Mr. Rosalyn Gess, [place].  After reviewing all the available data and chart,  I agree with his findings, examination as well as impression recommendations.  I saw Mr. Liska this morning. He is doing well with no active cardiac symptoms. His leg feels better.  I agree he has no signs of volume overload. We can switch him to by mouth Lasix 40 mg a day  based on the fact that that is the EF is reduced on echo.  He has chronic stable angina as per my previous note. I think this is [place] treated as an outpatient since he is somewhat complicated. His echo is notable for having reduced ejection fraction although are poor quality echo. I don't have his prior  evaluations. Would need to send this result to the [facility] cardiologist in [place].  For now would continue his Isordil.  I would convert metoprolol from tartrate succinate and equivalent dosing of 50 mg a day. Continue lisinopril and high-dose statin as well as aspirin and Plavix.   Leonie Man, M.D., M.S. Interventional Cardiologist   Pager # [phone] Phone # [phone] [address]. [place], [place] [address]

## 2016-01-27 NOTE — Progress Notes (Signed)
Pt has orders to be discharged. Discharge instructions given and pt has no additional questions at this time. Medication regimen reviewed and pt educated. Pt verbalized understanding and has no additional questions. Telemetry box removed. IV removed and site in good condition. Pt stable and waiting for transportation.   Jaelen Gellerman RN 

## 2016-01-28 ENCOUNTER — Telehealth: Payer: Self-pay

## 2016-01-28 NOTE — Telephone Encounter (Signed)
Transition Care Management Follow-up Telephone Call DX: Pnuemonia   Date discharged? 01/27/16  How have you been since you were released from the hospital? Pt is feeling much better since hospital visit.  Do you understand why you were in the hospital? Yes  Do you understand the discharge instructions? Yes  Where were you discharged to? Home  Items Reviewed:  Medications reviewed: Yes  Allergies reviewed: Yes   Dietary changes reviewed: Yes  Referrals reviewed: Yes  Functional Questionnaire:   Activities of Daily Living (ADLs):   States they are independent in the following: All ADL's States they require assistance with the following: none  Any transportation issues/concerns?: No  Any patient concerns? No concerns at this time  Confirmed importance and date/time of follow-up visits scheduled: Yes  Provider Appointment booked with PCP, Dr. Cathlean Cower  Confirmed with patient if condition begins to worsen call PCP or go to the ER.  Patient was given the office number and encouraged to call back with question or concerns: Yes

## 2016-01-31 LAB — CULTURE, BLOOD (ROUTINE X 2)
CULTURE: NO GROWTH
Culture: NO GROWTH

## 2016-02-03 ENCOUNTER — Ambulatory Visit (INDEPENDENT_AMBULATORY_CARE_PROVIDER_SITE_OTHER): Payer: Non-veteran care | Admitting: Internal Medicine

## 2016-02-03 ENCOUNTER — Encounter: Payer: Self-pay | Admitting: Internal Medicine

## 2016-02-03 VITALS — BP 136/72 | HR 62 | Temp 98.7°F | Resp 20 | Wt 184.0 lb

## 2016-02-03 DIAGNOSIS — E785 Hyperlipidemia, unspecified: Secondary | ICD-10-CM | POA: Diagnosis not present

## 2016-02-03 DIAGNOSIS — I10 Essential (primary) hypertension: Secondary | ICD-10-CM

## 2016-02-03 DIAGNOSIS — E119 Type 2 diabetes mellitus without complications: Secondary | ICD-10-CM | POA: Diagnosis not present

## 2016-02-03 DIAGNOSIS — F4310 Post-traumatic stress disorder, unspecified: Secondary | ICD-10-CM

## 2016-02-03 MED ORDER — LORAZEPAM 1 MG PO TABS: ORAL_TABLET | ORAL | Status: DC

## 2016-02-03 NOTE — Assessment & Plan Note (Signed)
stable overall by history and exam, recent data reviewed with pt, and pt to continue medical treatment as before,  to f/u any worsening symptoms or concerns Lab Results  Component Value Date   LDLCALC 135* 01/26/2016

## 2016-02-03 NOTE — Assessment & Plan Note (Signed)
stable overall by history and exam, recent data reviewed with pt, and pt to continue medical treatment as before,  to f/u any worsening symptoms or concerns BP Readings from Last 3 Encounters:  02/03/16 136/72  01/27/16 111/68  11/28/14 142/80

## 2016-02-03 NOTE — Assessment & Plan Note (Signed)
stable overall by history and exam, recent data reviewed with pt, and pt to continue medical treatment as before,  to f/u any worsening symptoms or concerns Lab Results  Component Value Date   HGBA1C 6.8* 01/26/2016

## 2016-02-03 NOTE — Progress Notes (Signed)
Pre visit review using our clinic review tool, if applicable. No additional management support is needed unless otherwise documented below in the visit note. 

## 2016-02-03 NOTE — Patient Instructions (Signed)
Please continue all other medications as before, and refills have been done if requested.  Please have the pharmacy call with any other refills you may need.  Please continue your efforts at being more active, low cholesterol diet, and weight control.  You are otherwise up to date with prevention measures today.  Please keep your appointments with your specialists as you may have planned  Please return in 1 year for your yearly visit, or sooner if needed, with Lab testing done 3-5 days before  

## 2016-02-03 NOTE — Progress Notes (Signed)
Subjective:    Patient ID: Jesse Hansen, male    DOB: 1946/10/15, 70 y.o.   MRN: [id number]  HPI  Her to f/u recent hospn 3/5/-3/7 with CAP/copd exac/acute syst CHF, now on lasix, and finished antibx, and prednisone. Echo with reduced EF at 40%. Today Pt denies chest pain, increased sob or doe, wheezing, orthopnea, PND, increased LE swelling, palpitations, dizziness or syncope.  Pt denies new neurological symptoms such as new headache, or facial or extremity weakness or numbness  Pt denies polydipsia, polyuria, or low sugar symptoms such as weakness or confusion improved with po intake.  Pt states overall good compliance with meds, trying to follow lower cholesterol, diabetic diet, wt overall stable but little exercise however.     No ST, fever, cough.  Had BMP at [facility] today, declines further labs;  Wt up a few lbs but last wt done wihtout clothing in hosp, today with full clothing. Pt quite happy with reduction in abd girth, worsening x 4 yrs per pt, no hx of cirrhosis, CT not done, recent LFT's neg. Wt Readings from Last 3 Encounters:  02/03/16 184 lb (83.462 kg)  01/27/16 181 lb 11.2 oz (82.419 kg)  11/28/14 190 lb (86.183 kg)   Past Medical History  Diagnosis Date  . Coronary artery disease involving native coronary artery with angina pectoris ([place]) 02/23/2008    s/p Multiple PCIs; CABG x 3 in 2013 (SVG-OM1, SVG-Diag with freeLIMA-LAD from SVG-Diag hood) --> relook cath in ~2015 with at least 1 graft ~occluded by report  . PTSD 07/30/2010    Qualifier: Diagnosis of  By: Jenny Reichmann MD, Hunt Oris   . PERIPHERAL VASCULAR DISEASE 02/23/2008    Qualifier: Diagnosis of  By: Jenny Reichmann MD, Hunt Oris   . HYPERTENSION 08/15/2007    Qualifier: Diagnosis of  By: Dance CMA ([place]), Kim    . HYPERLIPIDEMIA 02/23/2008    Qualifier: Diagnosis of  By: Jenny Reichmann MD, Hunt Oris   . DIABETES MELLITUS, TYPE II 01/29/2011    Qualifier: Diagnosis of  By: Jenny Reichmann MD, Hunt Oris   . ANXIETY 08/23/2009    Qualifier: Diagnosis of  By: Jenny Reichmann MD, Hunt Oris INSOMNIA-SLEEP DISORDER-UNSPEC 02/23/2008    Qualifier: Diagnosis of  By: Jenny Reichmann MD, [place] ERECTILE DYSFUNCTION 08/15/2007    Qualifier: Diagnosis of  By: Dance CMA ([place]), Kim    . COLONIC POLYPS, HX OF 01/29/2011    Qualifier: Diagnosis of  By: Jenny Reichmann MD, Hunt Oris    Past Surgical History  Procedure Laterality Date  . Carotid endarterectomy    . Coronary stent placement  2007  . Prostate biopsy  2009  . Coronary artery bypass graft  2013    @ [place], [place] ([place] was contaminated) -- SVG-Diag with freeLIMA-LAD sewn to SVG hood, SVG-OM1    reports that he has been smoking.  He has never used smokeless tobacco. He reports that he drinks alcohol. He reports that he does not use illicit drugs. family history includes Hypertension in his father. Allergies  Allergen Reactions  . Codeine    Current Outpatient Prescriptions on File Prior to Visit  Medication Sig Dispense Refill  . aspirin EC 81 MG tablet Take 1 tablet (81 mg total) by mouth daily. 90 tablet 11  . atorvastatin (LIPITOR) 80 MG tablet Take 80 mg by mouth daily. Take 0.5 tablet at bedtime.    . budesonide (PULMICORT) 0.5 MG/2ML nebulizer solution  Take 2 mLs (0.5 mg total) by nebulization 2 (two) times daily. 30 mL 0  . Cholecalciferol 2000 units TABS Take 2 tablets by mouth daily.    . clopidogrel (PLAVIX) 75 MG tablet Take 75 mg by mouth daily.    . furosemide (LASIX) 40 MG tablet Take 1 tablet (40 mg total) by mouth daily. 30 tablet 0  . glipiZIDE (GLUCOTROL) 5 MG tablet Take 5 mg by mouth 2 (two) times daily before a meal. Take 30 minutes prior to meals.    Marland Kitchen guaiFENesin (MUCINEX) 600 MG 12 hr tablet Take 1 tablet (600 mg total) by mouth 2 (two) times daily. 30 tablet 0  . ipratropium-albuterol (DUONEB) 0.5-2.5 (3) MG/3ML SOLN Take 3 mLs by nebulization 2 (two) times daily. 360 mL 0  . isosorbide dinitrate (ISORDIL) 20 MG tablet Take 20 mg by mouth 3 (three) times daily.     Marland Kitchen lisinopril  (PRINIVIL,ZESTRIL) 2.5 MG tablet Take 2 tablets (5 mg total) by mouth daily. 30 tablet 0  . metFORMIN (GLUCOPHAGE) 500 MG tablet Take 500 mg by mouth 2 (two) times daily with a meal.    . metoprolol tartrate (LOPRESSOR) 25 MG tablet Take 1 tablet (25 mg total) by mouth 2 (two) times daily. 60 tablet 0  . nitroGLYCERIN (NITROSTAT) 0.4 MG SL tablet Place 0.4 mg under the tongue every 5 (five) minutes as needed for chest pain.    Marland Kitchen omeprazole (PRILOSEC) 20 MG capsule Take 20 mg by mouth daily as needed (heartburn).    . ranitidine (ZANTAC) 150 MG tablet Take 150 mg by mouth 2 (two) times daily as needed for heartburn.     No current facility-administered medications on file prior to visit.    Review of Systems  Constitutional: Negative for unusual diaphoresis or night sweats HENT: Negative for ringing in ear or discharge Eyes: Negative for double vision or worsening visual disturbance.  Respiratory: Negative for choking and stridor.   Gastrointestinal: Negative for vomiting or other signifcant bowel change Genitourinary: Negative for hematuria or change in urine volume.  Musculoskeletal: Negative for other MSK pain or swelling Skin: Negative for color change and worsening wound.  Neurological: Negative for tremors and numbness other than noted  Psychiatric/Behavioral: Negative for decreased concentration or agitation other than above        Objective:   Physical Exam BP 136/72 mmHg  Pulse 62  Temp(Src) 98.7 F (37.1 C) (Oral)  Resp 20  Wt 184 lb (83.462 kg)  SpO2 97% VS noted,  Constitutional: Pt is oriented to person, place, and time. Appears well-developed and well-nourished, in no significant distress Head: Normocephalic and atraumatic.  Right Ear: External ear normal.  Left Ear: External ear normal.  Nose: Nose normal.  Mouth/Throat: Oropharynx is clear and moist.  Eyes: Conjunctivae and EOM are normal. Pupils are equal, round, and reactive to light.  Neck: Normal range of  motion. Neck supple. No JVD present. No tracheal deviation present or significant neck LA or mass Cardiovascular: Normal rate, regular rhythm, normal heart sounds and intact distal pulses.   Pulmonary/Chest: Effort normal and breath sounds without rales or wheezing  Abdominal: Soft. Bowel sounds are normal. NT. No HSM , + mild ascites it seems? Musculoskeletal: Normal range of motion. Exhibits no edema.  Lymphadenopathy:  Has no cervical adenopathy.  Neurological: Pt is alert and oriented to person, place, and time. Pt has normal reflexes. No cranial nerve deficit. Motor grossly intact Skin: Skin is warm and dry. No rash noted.  Psychiatric:  Has mild nervous mood and affect. Behavior is normal.     Assessment & Plan:

## 2016-02-03 NOTE — Assessment & Plan Note (Signed)
stable overall by history and exam, recent data reviewed with pt, and pt to continue medical treatment as before,  to f/u any worsening symptoms or concerns, for med refill Lab Results  Component Value Date   WBC 6.7 01/27/2016   HGB 14.3 01/27/2016   HCT 42.7 01/27/2016   PLT 185 01/27/2016   GLUCOSE 125* 01/27/2016   CHOL 189 01/26/2016   TRIG 121 01/26/2016   HDL 30* 01/26/2016   LDLCALC 135* 01/26/2016   NA 134* 01/27/2016   K 3.8 01/27/2016   CL 95* 01/27/2016   CREATININE 1.26* 01/27/2016   BUN 23* 01/27/2016   CO2 26 01/27/2016   TSH 2.187 01/26/2016   HGBA1C 6.8* 01/26/2016

## 2016-05-04 ENCOUNTER — Telehealth: Payer: Self-pay | Admitting: Cardiology

## 2016-05-04 NOTE — Telephone Encounter (Signed)
Attempted to reach patient - states call cannot be completed as dialed. Reattempted listed number w/ same result.

## 2016-05-04 NOTE — Telephone Encounter (Signed)
Pt calling regarding talking to Dr. Ellyn Hack about a heart surgery when in hospital a few months ago-pt calling to see if this can be scheduled-told patient he would need to schedule a consult ov first-but wanted a nurse call to discuss

## 2016-05-10 ENCOUNTER — Other Ambulatory Visit: Payer: Self-pay | Admitting: *Deleted

## 2016-05-10 DIAGNOSIS — I6522 Occlusion and stenosis of left carotid artery: Secondary | ICD-10-CM

## 2016-05-12 ENCOUNTER — Other Ambulatory Visit: Payer: Self-pay | Admitting: Internal Medicine

## 2016-05-12 ENCOUNTER — Encounter: Payer: Self-pay | Admitting: Vascular Surgery

## 2016-05-12 NOTE — Telephone Encounter (Signed)
Done hardcopy to Corinne  

## 2016-05-12 NOTE — Telephone Encounter (Signed)
Please advise 

## 2016-05-12 NOTE — Telephone Encounter (Signed)
Medication faxed to pharmacy 

## 2016-05-18 ENCOUNTER — Ambulatory Visit (HOSPITAL_COMMUNITY)
Admission: RE | Admit: 2016-05-18 | Discharge: 2016-05-18 | Disposition: A | Discharge status: Home / Self Care | Payer: Non-veteran care | Source: Ambulatory Visit | Attending: Vascular Surgery | Admitting: Vascular Surgery

## 2016-05-18 ENCOUNTER — Ambulatory Visit (INDEPENDENT_AMBULATORY_CARE_PROVIDER_SITE_OTHER): Payer: Non-veteran care | Admitting: Vascular Surgery

## 2016-05-18 ENCOUNTER — Encounter: Payer: Self-pay | Admitting: Vascular Surgery

## 2016-05-18 VITALS — BP 137/72 | HR 60 | Temp 97.8°F | Resp 16 | Ht 72.0 in | Wt 186.0 lb

## 2016-05-18 DIAGNOSIS — G4700 Insomnia, unspecified: Secondary | ICD-10-CM | POA: Insufficient documentation

## 2016-05-18 DIAGNOSIS — I10 Essential (primary) hypertension: Secondary | ICD-10-CM | POA: Insufficient documentation

## 2016-05-18 DIAGNOSIS — I708 Atherosclerosis of other arteries: Secondary | ICD-10-CM | POA: Insufficient documentation

## 2016-05-18 DIAGNOSIS — I6522 Occlusion and stenosis of left carotid artery: Secondary | ICD-10-CM | POA: Diagnosis not present

## 2016-05-18 DIAGNOSIS — F419 Anxiety disorder, unspecified: Secondary | ICD-10-CM | POA: Diagnosis not present

## 2016-05-18 DIAGNOSIS — E785 Hyperlipidemia, unspecified: Secondary | ICD-10-CM | POA: Diagnosis not present

## 2016-05-18 DIAGNOSIS — E119 Type 2 diabetes mellitus without complications: Secondary | ICD-10-CM | POA: Insufficient documentation

## 2016-05-18 LAB — VAS US CAROTID
LCCADSYS: 34 cm/s
LCCAPDIAS: 20 cm/s
LICAPDIAS: -211 cm/s
Left CCA dist dias: 13 cm/s
Left CCA prox sys: 69 cm/s
Left ICA dist dias: -20 cm/s
Left ICA dist sys: -57 cm/s
Left ICA prox sys: -610 cm/s

## 2016-05-18 NOTE — Progress Notes (Signed)
Vascular and Vein Specialist of [facility]  Patient name: Jesse Hansen MRN: [id number] DOB: 1946-11-08 Sex: male  REASON FOR CONSULT: Evaluation for severe left carotid artery stenosis  HPI: Jesse Hansen is a 70 y.o. male, who is seen for evaluation of carotid artery disease. He has an extensive past medical history. He is status post left carotid endarterectomy at [facility] approximately 6 or 7 years ago. He reports that time that he had a great deal of pain following surgery and also noted difficulty swallowing and tongue deviation following this. He was told that the disease progressed distal to the bifurcation. He has a known right internal carotid artery occlusion. He had a recent screening a follow-up and this showed high-grade recurrent disease on the left carotid artery. Fortunately he has not had any amaurosis fugax, transient ischemic attack or stroke. Does have a history of prior coronary artery bypass grafting approximately 4-5 years ago at [facility] in [place]. He has had recurrent coronary disease by catheterization but was not felt safe to proceed with angioplasty and stenting at that time. He does have ongoing angina. This is a partially relieved with nitroglycerin.  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 We are trying to retrieve his records from this treatment but he feels that I did his surgery at that time.  Past Medical History  Diagnosis Date  . Coronary artery disease involving native coronary artery with angina pectoris ([place]) 02/23/2008    s/p Multiple PCIs; CABG x 3 in 2013 (SVG-OM1, SVG-Diag with freeLIMA-LAD from SVG-Diag hood) --> relook cath in ~2015 with at least 1 graft ~occluded by report  . PTSD 07/30/2010    Qualifier: Diagnosis of  By: Jenny Reichmann MD, Hunt Oris   . PERIPHERAL VASCULAR DISEASE 02/23/2008    Qualifier: Diagnosis of  By: Jenny Reichmann MD, Hunt Oris   . HYPERTENSION 08/15/2007    Qualifier: Diagnosis  of  By: Dance CMA ([place]), Kim    . HYPERLIPIDEMIA 02/23/2008    Qualifier: Diagnosis of  By: Jenny Reichmann MD, Hunt Oris   . DIABETES MELLITUS, TYPE II 01/29/2011    Qualifier: Diagnosis of  By: Jenny Reichmann MD, Hunt Oris   . ANXIETY 08/23/2009    Qualifier: Diagnosis of  By: Jenny Reichmann MD, Hunt Oris INSOMNIA-SLEEP DISORDER-UNSPEC 02/23/2008    Qualifier: Diagnosis of  By: Jenny Reichmann MD, [place] ERECTILE DYSFUNCTION 08/15/2007    Qualifier: Diagnosis of  By: Dance CMA ([place]), Kim    . COLONIC POLYPS, HX OF 01/29/2011    Qualifier: Diagnosis of  By: Jenny Reichmann MD, Hunt Oris     Family History  Problem Relation Age of Onset  . Hypertension Father     SOCIAL HISTORY: Social History   Social History  . Marital Status: Married    Spouse Name: N/A  . Number of Children: N/A  . Years of Education: N/A   Occupational History  . Not on file.   Social History Main Topics  . Smoking status: Current Every Day Smoker -- 0.25 packs/day    Types: Cigarettes  . Smokeless tobacco: Never Used  . Alcohol Use: 0.0 oz/week    0 Standard drinks or equivalent per week  . Drug Use: No  . Sexual Activity: Not on file   Other Topics Concern  . Not on file   Social History Narrative    Allergies  Allergen Reactions  . Codeine     Current Outpatient Prescriptions  Medication Sig Dispense Refill  . aspirin EC 81 MG tablet Take 1 tablet (81 mg total) by mouth daily. 90 tablet 11  . atorvastatin (LIPITOR) 80 MG tablet Take 80 mg by mouth daily. Take 0.5 tablet at bedtime.    . Cholecalciferol 2000 units TABS Take 2 tablets by mouth daily.    . clopidogrel (PLAVIX) 75 MG tablet Take 75 mg by mouth daily.    . furosemide (LASIX) 40 MG tablet Take 1 tablet (40 mg total) by mouth daily. 30 tablet 0  . glipiZIDE (GLUCOTROL) 5 MG tablet Take 5 mg by mouth 2 (two) times daily before a meal. Take 30 minutes prior to meals.    . isosorbide dinitrate (ISORDIL) 20 MG tablet Take 40 mg by mouth 3 (three) times daily.     Marland Kitchen lisinopril  (PRINIVIL,ZESTRIL) 2.5 MG tablet Take 2 tablets (5 mg total) by mouth daily. 30 tablet 0  . LORazepam (ATIVAN) 1 MG tablet TAKE TWO (2) TABLETS BY MOUTH AT BEDTIMEAS NEEDED 60 tablet 2  . metFORMIN (GLUCOPHAGE) 500 MG tablet Take 500 mg by mouth 2 (two) times daily with a meal.    . metoprolol tartrate (LOPRESSOR) 25 MG tablet Take 1 tablet (25 mg total) by mouth 2 (two) times daily. 60 tablet 0  . nitroGLYCERIN (NITROSTAT) 0.4 MG SL tablet Place 0.4 mg under the tongue every 5 (five) minutes as needed for chest pain.    . ranitidine (ZANTAC) 150 MG tablet Take 150 mg by mouth 2 (two) times daily as needed for heartburn.    Marland Kitchen omeprazole (PRILOSEC) 20 MG capsule Take 20 mg by mouth daily as needed (heartburn). Reported on 05/18/2016     No current facility-administered medications for this visit.    REVIEW OF SYSTEMS:  [X]  denotes positive finding, [ ]  denotes negative finding Cardiac  Comments:  Chest pain or chest pressure: x   Shortness of breath upon exertion: x   Short of breath when lying flat:    Irregular heart rhythm:        Vascular    Pain in calf, thigh, or hip brought on by ambulation:    Pain in feet at night that wakes you up from your sleep:     Blood clot in your veins:    Leg swelling:         Pulmonary    Oxygen at home:    Productive cough:  x   Wheezing:  x       Neurologic    Sudden weakness in arms or legs:     Sudden numbness in arms or legs:     Sudden onset of difficulty speaking or slurred speech:    Temporary loss of vision in one eye:     Problems with dizziness:         Gastrointestinal    Blood in stool:     Vomited blood:         Genitourinary    Burning when urinating:     Blood in urine:        Psychiatric    Major depression:         Hematologic    Bleeding problems:    Problems with blood clotting too easily:        Skin    Rashes or ulcers:        Constitutional    Fever or chills:      PHYSICAL EXAM: Filed Vitals:    05/18/16 1446  BP: 137/72  Pulse: 60  Temp: 97.8 F (36.6 C)  TempSrc: Oral  Resp: 16  Height: 6' (1.829 m)  Weight: 186 lb (84.369 kg)  SpO2: 98%    GENERAL: The patient is a well-nourished male, in no acute distress. The vital signs are documented above. CARDIAC: There is a regular rate and rhythm.  VASCULAR: 2+ radial pulses bilaterally. 2+ dorsalis pedis pulses bilaterally PULMONARY: There is good air exchange bilaterally without wheezing or rales. ABDOMEN: Soft and non-tender with normal pitched bowel sounds.  MUSCULOSKELETAL: There are no major deformities or cyanosis. NEUROLOGIC: No focal weakness or paresthesias are detected. SKIN: There are no ulcers or rashes noted. PSYCHIATRIC: The patient has a normal affect. Carotid with the well-healed incision on the left neck. Extends above the earlobe. Harsh bruit in the left carotid. No bruit on the right  DATA:  Repeat duplex in our office confirms high-grade stenosis with a systolic velocities of A999333 cm/s and incised systolic velocities of 123456 cm/s in the left carotid bifurcation  MEDICAL ISSUES: Critical stenosis left internal carotid artery with known occlusion of right carotid artery. Patient is status post endarterectomy and [facility] approximate 6 years ago. Had complications of tongue deviation and difficulty swallowing after that surgery. Will obtain CT angiogram for further evaluation and make recommendations of redo endarterectomy versus stenting following this. Discussed the merits of both of these at length with the patient and his wife present. Will also have patient follow-up with Dr. Ellyn Hack regarding coronary disease prior to any surgical intervention if needed   Rosetta Posner, MD [facility] Vascular and Vein Specialists of [facility] [phone] Pager [phone]

## 2016-05-19 ENCOUNTER — Telehealth: Payer: Self-pay | Admitting: Cardiology

## 2016-05-19 NOTE — Telephone Encounter (Signed)
Request for surgical clearance:  1. What type of surgery is being performed? Rt CEA vs Stenting  2. When is this surgery scheduled? n/a  3. Are there any medications that need to be held prior to surgery and how long? no  4. Name of physician performing surgery? Early   5. What is your office phone and fax number? [phone] 6.

## 2016-05-19 NOTE — Telephone Encounter (Signed)
I only saw the patient as inpatient consult. He has an Geneticist, molecular - as discussed in my consult note.  My note even said that I would defer diagnostic tests etc to his primary cardiologist @ the [place]. I only saw him once.  Glenetta Hew c

## 2016-05-19 NOTE — Telephone Encounter (Signed)
Routed to Dr. Ellyn Hack for clearance review.

## 2016-05-19 NOTE — Addendum Note (Signed)
Addended by: Reola Calkins on: 05/19/2016 09:48 AM   Modules accepted: Orders

## 2016-05-20 NOTE — Telephone Encounter (Signed)
Called [facility] and left msg requesting call back to discuss. Pt will need to be cleared by Dr. Raynelle Dick w/ [facility] in [place].

## 2016-05-20 NOTE — Telephone Encounter (Signed)
Received notification from our operator that Juliann Pulse will contact [name] cardiologist.

## 2016-05-21 ENCOUNTER — Encounter: Payer: Self-pay | Admitting: Vascular Surgery

## 2016-05-28 ENCOUNTER — Telehealth: Payer: Self-pay

## 2016-05-28 NOTE — Telephone Encounter (Signed)
Phone call from pt. and his wife to report pt. has been having "short bursts of headaches in the left temporal area."  Reported that the headaches only last a few seconds, then go away.  Denied any visual disturbance, slurred speech, facial droop, unilateral weakness of extremities.  Stated that "nothing has changed since I was up there, except for these headaches."  Stated the headaches are occuring about 3 x/day, and that headaches are unusual for him.  Wife stated that the pt's Metformin dose was increased recently.  Wife reported the pt. c/o intermittent dizziness.  Advised that the type of headache he is describing is not typical with carotid artery disease.  Advised to go to ER, over weekend, if worsening of symptoms.  Pt. verb. understanding and agreed.

## 2016-06-01 ENCOUNTER — Ambulatory Visit
Admission: RE | Admit: 2016-06-01 | Discharge: 2016-06-01 | Disposition: A | Discharge status: Home / Self Care | Payer: Non-veteran care | Source: Ambulatory Visit | Attending: Vascular Surgery | Admitting: Vascular Surgery

## 2016-06-01 ENCOUNTER — Ambulatory Visit (INDEPENDENT_AMBULATORY_CARE_PROVIDER_SITE_OTHER): Payer: Non-veteran care | Admitting: Vascular Surgery

## 2016-06-01 ENCOUNTER — Encounter: Payer: Self-pay | Admitting: Vascular Surgery

## 2016-06-01 VITALS — BP 74/56 | HR 63 | Ht 72.0 in | Wt 188.2 lb

## 2016-06-01 DIAGNOSIS — I6522 Occlusion and stenosis of left carotid artery: Secondary | ICD-10-CM

## 2016-06-01 MED ORDER — IOPAMIDOL (ISOVUE-370) INJECTION 76%
80.0000 mL | Freq: Once | INTRAVENOUS | Status: AC | PRN
  Administered 2016-06-01: 80 mL via INTRAVENOUS

## 2016-06-01 NOTE — Progress Notes (Signed)
Referring Physician: Biagio Borg, MD  Patient name: Jesse Hansen MRN: [id number] DOB: May 11, 1946 Sex: male   [id number] Jesse Hansen is a 70 y.o. male, who is seen for evaluation of carotid artery disease on 05/18/2016.   He has returned for review of a CTA of his neck to better define his left carotid artery stenosis.    He has an extensive past medical history. He is status post left carotid endarterectomy at [facility] approximately 6 or 7 years ago. He reports that time that he had a great deal of pain following surgery and also noted difficulty swallowing and tongue deviation following this. He was told that the disease progressed distal to the bifurcation. He has a known right internal carotid artery occlusion. He had a recent screening a follow-up and this showed high-grade recurrent disease on the left carotid artery. Fortunately he has not had any amaurosis fugax, transient ischemic attack or stroke. Does have a history of prior coronary artery bypass grafting approximately 4-5 years ago at [facility] in [place]. He has had recurrent coronary disease by catheterization but was not felt safe to proceed with angioplasty and stenting at that time. He does have ongoing angina. This is a partially relieved with nitroglycerin.  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 We are trying to retrieve his records from this treatment but he feels that I did his surgery at that time.   Past Medical History  Diagnosis Date  . Coronary artery disease involving native coronary artery with angina pectoris ([place]) 02/23/2008    s/p Multiple PCIs; CABG x 3 in 2013 (SVG-OM1, SVG-Diag with freeLIMA-LAD from SVG-Diag hood) --> relook cath in ~2015 with at least 1 graft ~occluded by report  . PTSD 07/30/2010    Qualifier: Diagnosis of  By: Jenny Reichmann MD, Hunt Oris   . PERIPHERAL VASCULAR DISEASE 02/23/2008    Qualifier: Diagnosis of  By: Jenny Reichmann MD, Hunt Oris   . HYPERTENSION 08/15/2007    Qualifier:  Diagnosis of  By: Dance CMA ([place]), Kim    . HYPERLIPIDEMIA 02/23/2008    Qualifier: Diagnosis of  By: Jenny Reichmann MD, Hunt Oris   . DIABETES MELLITUS, TYPE II 01/29/2011    Qualifier: Diagnosis of  By: Jenny Reichmann MD, Hunt Oris   . ANXIETY 08/23/2009    Qualifier: Diagnosis of  By: Jenny Reichmann MD, Hunt Oris INSOMNIA-SLEEP DISORDER-UNSPEC 02/23/2008    Qualifier: Diagnosis of  By: Jenny Reichmann MD, [place] ERECTILE DYSFUNCTION 08/15/2007    Qualifier: Diagnosis of  By: Dance CMA ([place]), Kim    . COLONIC POLYPS, HX OF 01/29/2011    Qualifier: Diagnosis of  By: Jenny Reichmann MD, Hunt Oris    Past Surgical History  Procedure Laterality Date  . Coronary stent placement  2007  . Prostate biopsy  2009  . Coronary artery bypass graft  2013    @ [place], [place] ([place] was contaminated) -- SVG-Diag with freeLIMA-LAD sewn to SVG hood, SVG-OM1  . Carotid endarterectomy Left     Dr. Volney American at [facility] in [place]   . Abdominal aortic aneurysm repair  1996?    Done at [facility] by Dr. Donnetta Hutching    Family History  Problem Relation Age of Onset  . Hypertension Father     SOCIAL HISTORY: Social History   Social History  . Marital Status: Married    Spouse Name: N/A  . Number of Children:  N/A  . Years of Education: N/A   Occupational History  . Not on file.   Social History Main Topics  . Smoking status: Current Every Day Smoker -- 0.25 packs/day    Types: Cigarettes  . Smokeless tobacco: Never Used  . Alcohol Use: 0.0 oz/week    0 Standard drinks or equivalent per week  . Drug Use: No  . Sexual Activity: Not on file   Other Topics Concern  . Not on file   Social History Narrative    Allergies  Allergen Reactions  . Codeine     Current Outpatient Prescriptions  Medication Sig Dispense Refill  . aspirin EC 81 MG tablet Take 1 tablet (81 mg total) by mouth daily. 90 tablet 11  . atorvastatin (LIPITOR) 80 MG tablet Take 80 mg by mouth daily. Take 0.5 tablet at bedtime.    . Cholecalciferol 2000  units TABS Take 2 tablets by mouth daily.    . clopidogrel (PLAVIX) 75 MG tablet Take 75 mg by mouth daily.    . furosemide (LASIX) 40 MG tablet Take 1 tablet (40 mg total) by mouth daily. 30 tablet 0  . glipiZIDE (GLUCOTROL) 5 MG tablet Take 5 mg by mouth 2 (two) times daily before a meal. Take 30 minutes prior to meals.    . isosorbide dinitrate (ISORDIL) 20 MG tablet Take 40 mg by mouth 3 (three) times daily.     Marland Kitchen lisinopril (PRINIVIL,ZESTRIL) 2.5 MG tablet Take 2 tablets (5 mg total) by mouth daily. 30 tablet 0  . LORazepam (ATIVAN) 1 MG tablet TAKE TWO (2) TABLETS BY MOUTH AT BEDTIMEAS NEEDED 60 tablet 2  . metFORMIN (GLUCOPHAGE) 500 MG tablet Take 500 mg by mouth 2 (two) times daily with a meal.    . metoprolol tartrate (LOPRESSOR) 25 MG tablet Take 1 tablet (25 mg total) by mouth 2 (two) times daily. 60 tablet 0  . nitroGLYCERIN (NITROSTAT) 0.4 MG SL tablet Place 0.4 mg under the tongue every 5 (five) minutes as needed for chest pain.    Marland Kitchen omeprazole (PRILOSEC) 20 MG capsule Take 20 mg by mouth daily as needed (heartburn). Reported on 05/18/2016    . ranitidine (ZANTAC) 150 MG tablet Take 150 mg by mouth 2 (two) times daily as needed for heartburn.     No current facility-administered medications for this visit.    ROS:   General:  No weight loss, Fever, chills  HEENT: No recent headaches, no nasal bleeding, no visual changes, no sore throat  Neurologic: No dizziness, blackouts, seizures. No recent symptoms of stroke or mini- stroke. No recent episodes of slurred speech, or temporary blindness.  Cardiac: No recent episodes of chest pain/pressure, no shortness of breath at rest.  No shortness of breath with exertion.  Denies history of atrial fibrillation or irregular heartbeat  Vascular: No history of rest pain in feet.  No history of claudication.  No history of non-healing ulcer, No history of DVT   Pulmonary: No home oxygen, no productive cough, no hemoptysis,  No asthma or  wheezing  Musculoskeletal:  [ ]  Arthritis, [ ]  Low back pain,  [ ]  Joint pain  Hematologic:No history of hypercoagulable state.  No history of easy bleeding.  No history of anemia  Gastrointestinal: No hematochezia or melena,  No gastroesophageal reflux, no trouble swallowing  Urinary: [ ]  chronic Kidney disease, [ ]  on HD - [ ]  MWF or [ ]  TTHS, [ ]  Burning with urination, [ ]  Frequent urination, [ ]   Difficulty urinating;   Skin: No rashes  Psychological: No history of anxiety,  No history of depression   Physical Examination  Filed Vitals:   06/01/16 1521 06/01/16 1522  BP: 152/70 74/56  Pulse: 63   Height: 6' (1.829 m)   Weight: 188 lb 3.2 oz (85.367 kg)   SpO2: 97%     Body mass index is 25.52 kg/(m^2).  General:  Alert and oriented, no acute distress HEENT: Normal Neck: No bruit or JVD Pulmonary: Clear to auscultation bilaterally Cardiac: Regular Rate and Rhythm without murmur Abdomen: Soft, non-tender, non-distended, no mass, no scars Skin: No rash Extremity Pulses:  2+ radial, brachial, femoral, dorsalis pedis, posterior tibial pulses bilaterally Musculoskeletal: No deformity or edema  Neurologic: Upper and lower extremity motor 5/5 and symmetric  DATA:  CTA neck Right carotid artery Complete occlusion of the internal carotid artery from the bifurcation to the intracranial distal cavernous segment.  Left carotid system: Predominantly fibrofatty plaque greatest at the bifurcation with severe 70% the stenosis of the distal common carotid artery just proximal to the bifurcation. There is poststenotic dilatation of the carotid bifurcation measuring up to 1.4 cm with extensive irregular fibrofatty plaque of the lumen. The internal carotid artery of the neck is otherwise patent.    . Vertebral arteries:The right vertebral artery is patent without significant stenosis, aneurysm, or dissection.  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   The vessel just downstream to the plaque is patent reconstituted via cervical collaterals, and possibly retrograde from the basilar artery. There is otherwise no high-grade stenosis, dissection, or aneurysm of the left vertebral artery.     ASSESSMENT:   Occlusion of the right ICA, left vertebral artery.  After reviewing the CTA with Dr. Donnetta Hutching the left ICA is 70% stenosis and the patient is asymptomatic.  The left IC is dilated this could be the artery itself or the size of the patch.       PLAN:   We will continue to watch the left ICA by repeat carotid duplex.  The duplex could be manipulated secondary to previous surgery and or having a right sided occlusion.  We will base our next duplex by comparing it to the next one in 6 months.  If he has symptoms of TIA or stroke this would be an indicator for immediate surgery.  If surgery was needed in the future we would plan left carotid stent placement in stead of open redo repair.      Jesse Hansen, Jesse Hansen [facility]   The patient was seen in conjunction with Dr. Donnetta Hutching today  I have examined the patient, reviewed and agree with above. ET angiogram discussed with the patient and his wife present. Does have approximate 70% stenosis in his common carotid artery at the level of his prior endarterectomy. Since he is completely asymptomatic would recommend continued follow-up. Again explain symptoms of carotid disease to both the patient and his wife. We will see him again in 6 months with repeat carotid duplex  Curt Jews, MD 06/01/2016 4:26 PM

## 2016-08-12 ENCOUNTER — Telehealth: Payer: Self-pay | Admitting: Internal Medicine

## 2016-08-12 MED ORDER — LORAZEPAM 1 MG PO TABS: ORAL_TABLET | ORAL | 2 refills | Status: DC

## 2016-08-12 NOTE — Telephone Encounter (Signed)
Done hardcopy to Corinne  

## 2016-08-12 NOTE — Telephone Encounter (Signed)
Pt request refill for LORazepam (ATIVAN) 1 MG tablet. Pt stated pharmacy has been trying to get it for 2 weeks now, so he was wondering if he can get it done today.

## 2016-08-13 NOTE — Telephone Encounter (Signed)
faxed

## 2016-11-08 ENCOUNTER — Other Ambulatory Visit: Payer: Self-pay | Admitting: Internal Medicine

## 2016-11-08 NOTE — Telephone Encounter (Signed)
Routing to PCP, please advise

## 2016-11-09 NOTE — Telephone Encounter (Signed)
faxed

## 2016-12-07 ENCOUNTER — Other Ambulatory Visit: Payer: Self-pay

## 2016-12-07 DIAGNOSIS — I6529 Occlusion and stenosis of unspecified carotid artery: Secondary | ICD-10-CM

## 2016-12-09 ENCOUNTER — Encounter: Payer: Self-pay | Admitting: Vascular Surgery

## 2016-12-14 ENCOUNTER — Ambulatory Visit (HOSPITAL_COMMUNITY): Payer: Non-veteran care

## 2016-12-14 ENCOUNTER — Ambulatory Visit: Payer: Non-veteran care | Admitting: Vascular Surgery

## 2017-01-25 ENCOUNTER — Ambulatory Visit: Payer: Non-veteran care | Admitting: Vascular Surgery

## 2017-01-25 ENCOUNTER — Encounter (HOSPITAL_COMMUNITY): Payer: Non-veteran care

## 2017-02-09 ENCOUNTER — Other Ambulatory Visit: Payer: Self-pay | Admitting: Internal Medicine

## 2017-02-09 NOTE — Telephone Encounter (Signed)
Called and left LVM to inform him of the notes, also to call back and set up and appointment. Thank you.

## 2017-02-09 NOTE — Telephone Encounter (Signed)
Done hardcopy to Shirron  Please ask pt to make ROV as he is due, fo further refills

## 2017-03-03 ENCOUNTER — Encounter: Payer: Self-pay | Admitting: Vascular Surgery

## 2017-03-07 ENCOUNTER — Other Ambulatory Visit: Payer: Self-pay | Admitting: Internal Medicine

## 2017-03-07 NOTE — Telephone Encounter (Signed)
Routing to dr john, please advise, thanks 

## 2017-03-08 NOTE — Telephone Encounter (Signed)
Faxed  C[place]_please schedule OV for pt. Thanks!

## 2017-03-08 NOTE — Telephone Encounter (Signed)
Done hardcopy to Shirron  Pt due for ROV please

## 2017-03-10 NOTE — Telephone Encounter (Signed)
Appointment scheduled for next Thursday (03/17/17)

## 2017-03-15 ENCOUNTER — Ambulatory Visit (HOSPITAL_COMMUNITY)
Admission: RE | Admit: 2017-03-15 | Discharge: 2017-03-15 | Disposition: A | Discharge status: Home / Self Care | Payer: Non-veteran care | Source: Ambulatory Visit | Attending: Vascular Surgery | Admitting: Vascular Surgery

## 2017-03-15 ENCOUNTER — Encounter: Payer: Self-pay | Admitting: Vascular Surgery

## 2017-03-15 ENCOUNTER — Ambulatory Visit (INDEPENDENT_AMBULATORY_CARE_PROVIDER_SITE_OTHER): Payer: Non-veteran care | Admitting: Vascular Surgery

## 2017-03-15 VITALS — BP 149/71 | HR 57 | Temp 97.4°F | Resp 16 | Ht 72.0 in | Wt 187.0 lb

## 2017-03-15 DIAGNOSIS — I6522 Occlusion and stenosis of left carotid artery: Secondary | ICD-10-CM

## 2017-03-15 DIAGNOSIS — I6529 Occlusion and stenosis of unspecified carotid artery: Secondary | ICD-10-CM

## 2017-03-15 DIAGNOSIS — I6521 Occlusion and stenosis of right carotid artery: Secondary | ICD-10-CM | POA: Insufficient documentation

## 2017-03-15 LAB — VAS US CAROTID
LCCADDIAS: -20 cm/s
LCCADSYS: -60 cm/s
LCCAPSYS: 121 cm/s
LEFT ECA DIAS: -8 cm/s
LEFT VERTEBRAL DIAS: 6 cm/s
LICADSYS: -124 cm/s
Left CCA prox dias: 33 cm/s
Left ICA dist dias: -50 cm/s
Left ICA prox dias: -204 cm/s
Left ICA prox sys: -625 cm/s
Right CCA prox sys: 121 cm/s

## 2017-03-15 NOTE — Progress Notes (Signed)
[facility]  Patient name: Jesse Hansen MRN: [id number] DOB: 19-Feb-1946 Sex: male  REASON FOR VISIT: [place] up carotid disease  HPI: Jesse Hansen is a 71 y.o. male here today for follow-up. I'd seen him in July with evaluation of carotid stenosis. He has a known occlusion of his right internal carotid artery. He had left carotid endarterectomy at the [place] in [facility]. He had a very difficult time with recovery with tongue deviation difficulty swallowing and hoarseness. I eventually had resolution of this. He has developed recurrent narrowing at the endarterectomy site. When I initially saw him in June 2017 his velocities suggested critical stenosis. He underwent CTA suggesting approximate 70% stenosis in his distal common carotid artery just below the endarterectomy site at the bifurcation. He continues to remain asymptomatic. He has had no new cardiac disease. Does have a history of prior coronary bypass grafting  Past Medical History:  Diagnosis Date  . ANXIETY 08/23/2009   Qualifier: Diagnosis of  By: Jenny Reichmann MD, [place], HX OF 01/29/2011   Qualifier: Diagnosis of  By: Jenny Reichmann MD, Hunt Oris   . Coronary artery disease involving native coronary artery with angina pectoris ([place]) 02/23/2008   s/p Multiple PCIs; CABG x 3 in 2013 (SVG-OM1, SVG-Diag with freeLIMA-LAD from SVG-Diag hood) --> relook cath in ~2015 with at least 1 graft ~occluded by report  . DIABETES MELLITUS, TYPE II 01/29/2011   Qualifier: Diagnosis of  By: Jenny Reichmann MD, [place] ERECTILE DYSFUNCTION 08/15/2007   Qualifier: Diagnosis of  By: Dance CMA ([place]), Kim    . HYPERLIPIDEMIA 02/23/2008   Qualifier: Diagnosis of  By: Jenny Reichmann MD, Hunt Oris   . HYPERTENSION 08/15/2007   Qualifier: Diagnosis of  By: Dance CMA ([place]), Kim    . INSOMNIA-SLEEP DISORDER-UNSPEC 02/23/2008   Qualifier: Diagnosis of  By: Jenny Reichmann MD, Hunt Oris   . PERIPHERAL VASCULAR DISEASE  02/23/2008   Qualifier: Diagnosis of  By: Jenny Reichmann MD, Hunt Oris   . PTSD 07/30/2010   Qualifier: Diagnosis of  By: Jenny Reichmann MD, Hunt Oris     Family History  Problem Relation Age of Onset  . Hypertension Father     SOCIAL HISTORY: Social History  Substance Use Topics  . Smoking status: Current Every Day Smoker    Types: Cigarettes  . Smokeless tobacco: Never Used     Comment: 4-8 cigarettes per day  . Alcohol use 0.0 oz/week    Allergies  Allergen Reactions  . Codeine     Current Outpatient Prescriptions  Medication Sig Dispense Refill  . aspirin EC 81 MG tablet Take 1 tablet (81 mg total) by mouth daily. 90 tablet 11  . atorvastatin (LIPITOR) 80 MG tablet Take 80 mg by mouth daily. Take 0.5 tablet at bedtime.    . Cholecalciferol 2000 units TABS Take 2 tablets by mouth daily.    . clopidogrel (PLAVIX) 75 MG tablet Take 75 mg by mouth daily.    . furosemide (LASIX) 40 MG tablet Take 1 tablet (40 mg total) by mouth daily. 30 tablet 0  . glipiZIDE (GLUCOTROL) 5 MG tablet Take 5 mg by mouth 2 (two) times daily before a meal. Take 30 minutes prior to meals.    . isosorbide dinitrate (ISORDIL) 20 MG tablet Take 40 mg by mouth 3 (three) times daily.     Marland Kitchen lisinopril (PRINIVIL,ZESTRIL) 2.5 MG tablet Take 2 tablets (5 mg total) by mouth  daily. 30 tablet 0  . LORazepam (ATIVAN) 1 MG tablet TAKE TWO (2) TABLETS BY MOUTH AT BEDTIMEAS NEEDED 60 tablet 0  . metFORMIN (GLUCOPHAGE) 500 MG tablet Take 500 mg by mouth 2 (two) times daily with a meal.    . metoprolol tartrate (LOPRESSOR) 25 MG tablet Take 1 tablet (25 mg total) by mouth 2 (two) times daily. 60 tablet 0  . nitroGLYCERIN (NITROSTAT) 0.4 MG SL tablet Place 0.4 mg under the tongue every 5 (five) minutes as needed for chest pain.    Marland Kitchen omeprazole (PRILOSEC) 20 MG capsule Take 20 mg by mouth daily as needed (heartburn). Reported on 05/18/2016    . ranitidine (ZANTAC) 150 MG tablet Take 150 mg by mouth 2 (two) times daily as needed for heartburn.       No current facility-administered medications for this visit.     REVIEW OF SYSTEMS:  [X]  denotes positive finding, [ ]  denotes negative finding Cardiac  Comments:  Chest pain or chest pressure:    Shortness of breath upon exertion:    Short of breath when lying flat:    Irregular heart rhythm:        Vascular    Pain in calf, thigh, or hip brought on by ambulation:    Pain in feet at night that wakes you up from your sleep:     Blood clot in your veins:    Leg swelling:           PHYSICAL EXAM: Vitals:   03/15/17 1214  BP: (!) 149/71  Pulse: (!) 57  Resp: 16  Temp: 97.4 F (36.3 C)  TempSrc: Oral  SpO2: 97%  Weight: 187 lb (84.8 kg)  Height: 6' (1.829 m)    GENERAL: The patient is a well-nourished male, in no acute distress. The vital signs are documented above. CARDIOVASCULAR: 2+ radial pulses bilaterally. He does have a well-healed left neck incision with soft bruits bilaterally. He does have 2+ dorsalis pedis pulses bilaterally PULMONARY: There is good air exchange  MUSCULOSKELETAL: There are no major deformities or cyanosis. NEUROLOGIC: No focal weakness or paresthesias are detected. SKIN: There are no ulcers or rashes noted. PSYCHIATRIC: The patient has a normal affect.  DATA:  Duplex today shows a markedly elevated velocities with end-diastolic velocity of 001 cm/s. Does show occlusion of his right internal carotid artery  MEDICAL ISSUES: Very long discussion with patient and his wife present. Difficult management decision since his velocities suggest critical stenosis. This is unchanged from the study in June 2017. I again reviewed his CT scan images from July 2017. This certainly does show at least a 70% stenosis and potentially even higher grade than this. I have recommended cerebral arteriography for further evaluation and potential stenting if high-grade stenosis is confirmed. This is particularly in light of his contralateral right carotid occlusion. We  will coordinate this for Dr. Trula Slade for arteriography and potential stenting. The patient and his wife understands that I will not be doing the procedure. Will discuss and review the images with Dr. Trula Slade prior to this.    Rosetta Posner, MD FACS [facility] Tel [phone] Pager [phone]

## 2017-03-16 ENCOUNTER — Other Ambulatory Visit: Payer: Self-pay

## 2017-03-17 ENCOUNTER — Ambulatory Visit (INDEPENDENT_AMBULATORY_CARE_PROVIDER_SITE_OTHER): Payer: Non-veteran care | Admitting: Internal Medicine

## 2017-03-17 ENCOUNTER — Encounter: Payer: Self-pay | Admitting: Internal Medicine

## 2017-03-17 VITALS — BP 118/62 | HR 67 | Ht 72.0 in | Wt 184.0 lb

## 2017-03-17 DIAGNOSIS — E119 Type 2 diabetes mellitus without complications: Secondary | ICD-10-CM | POA: Diagnosis not present

## 2017-03-17 DIAGNOSIS — F4310 Post-traumatic stress disorder, unspecified: Secondary | ICD-10-CM | POA: Diagnosis not present

## 2017-03-17 DIAGNOSIS — I10 Essential (primary) hypertension: Secondary | ICD-10-CM

## 2017-03-17 DIAGNOSIS — E785 Hyperlipidemia, unspecified: Secondary | ICD-10-CM

## 2017-03-17 MED ORDER — LORAZEPAM 1 MG PO TABS: ORAL_TABLET | ORAL | 5 refills | Status: DC

## 2017-03-17 NOTE — Progress Notes (Signed)
Subjective:    Patient ID: Jesse Hansen, male    DOB: 11-03-1946, 71 y.o.   MRN: [id number]  HPI  Here to f/u; overall doing ok,  Pt denies chest pain, increasing sob or doe, wheezing, orthopnea, PND, increased LE swelling, palpitations, dizziness or syncope.  Pt denies new neurological symptoms such as new headache, or facial or extremity weakness or numbness.  Pt denies polydipsia, polyuria, or low sugar episode.   Pt denies new neurological symptoms such as new headache, or facial or extremity weakness or numbness.   Pt states overall good compliance with meds, mostly trying to follow appropriate diet, with wt overall stable,  but little exercise however. Still prefers priimary care primarily at the [place].,  Due for left carotid stent to be done next wk, s/p CEA already, to be done at [facility] Readings from Last 3 Encounters:  03/17/17 184 lb (83.5 kg)  03/15/17 187 lb (84.8 kg)  06/01/16 188 lb 3.2 oz (85.4 kg)  Denies worsening depressive symptoms, suicidal ideation, or panic; has ongoing anxiety, but with borderline panic and PTSD, asking for lorazepam refill Past Medical History:  Diagnosis Date  . ANXIETY 08/23/2009   Qualifier: Diagnosis of  By: Jenny Reichmann MD, [place], HX OF 01/29/2011   Qualifier: Diagnosis of  By: Jenny Reichmann MD, Hunt Oris   . Coronary artery disease involving native coronary artery with angina pectoris ([place]) 02/23/2008   s/p Multiple PCIs; CABG x 3 in 2013 (SVG-OM1, SVG-Diag with freeLIMA-LAD from SVG-Diag hood) --> relook cath in ~2015 with at least 1 graft ~occluded by report  . DIABETES MELLITUS, TYPE II 01/29/2011   Qualifier: Diagnosis of  By: Jenny Reichmann MD, [place] ERECTILE DYSFUNCTION 08/15/2007   Qualifier: Diagnosis of  By: Dance CMA ([place]), Kim    . HYPERLIPIDEMIA 02/23/2008   Qualifier: Diagnosis of  By: Jenny Reichmann MD, Hunt Oris   . HYPERTENSION 08/15/2007   Qualifier: Diagnosis of  By: Dance CMA ([place]), Kim    . INSOMNIA-SLEEP DISORDER-UNSPEC 02/23/2008   Qualifier:  Diagnosis of  By: Jenny Reichmann MD, Hunt Oris   . PERIPHERAL VASCULAR DISEASE 02/23/2008   Qualifier: Diagnosis of  By: Jenny Reichmann MD, Hunt Oris   . PTSD 07/30/2010   Qualifier: Diagnosis of  By: Jenny Reichmann MD, Hunt Oris    Past Surgical History:  Procedure Laterality Date  . ABDOMINAL AORTIC ANEURYSM REPAIR  1996?   Done at [facility] by Dr. Donnetta Hutching  . CAROTID ENDARTERECTOMY Left    Dr. Volney American at [facility] in [place]   . CORONARY ARTERY BYPASS GRAFT  2013   @ [place], [place] ([place] was contaminated) -- SVG-Diag with freeLIMA-LAD sewn to SVG hood, SVG-OM1  . CORONARY STENT PLACEMENT  2007  . PROSTATE BIOPSY  2009    reports that he has been smoking Cigarettes.  He has never used smokeless tobacco. He reports that he drinks alcohol. He reports that he does not use drugs. family history includes Hypertension in his father. Allergies  Allergen Reactions  . Codeine    Current Outpatient Prescriptions on File Prior to Visit  Medication Sig Dispense Refill  . aspirin EC 81 MG tablet Take 1 tablet (81 mg total) by mouth daily. 90 tablet 11  . atorvastatin (LIPITOR) 80 MG tablet Take 80 mg by mouth daily. Take 0.5 tablet at bedtime.    . clopidogrel (PLAVIX) 75 MG tablet Take 75 mg by mouth daily.    Marland Kitchen  furosemide (LASIX) 40 MG tablet Take 1 tablet (40 mg total) by mouth daily. 30 tablet 0  . glipiZIDE (GLUCOTROL) 5 MG tablet Take 5 mg by mouth 2 (two) times daily before a meal. Take 30 minutes prior to meals.    . isosorbide dinitrate (ISORDIL) 20 MG tablet Take 40 mg by mouth 3 (three) times daily.     Marland Kitchen lisinopril (PRINIVIL,ZESTRIL) 2.5 MG tablet Take 2 tablets (5 mg total) by mouth daily. 30 tablet 0  . metFORMIN (GLUCOPHAGE) 500 MG tablet Take 500 mg by mouth 2 (two) times daily with a meal.    . nitroGLYCERIN (NITROSTAT) 0.4 MG SL tablet Place 0.4 mg under the tongue every 5 (five) minutes as needed for chest pain.    Marland Kitchen omeprazole (PRILOSEC) 20 MG capsule Take 20 mg by mouth daily as needed  (heartburn). Reported on 05/18/2016    . ranitidine (ZANTAC) 150 MG tablet Take 150 mg by mouth 2 (two) times daily as needed for heartburn.     No current facility-administered medications on file prior to visit.     Review of Systems  Constitutional: Negative for other unusual diaphoresis or sweats HENT: Negative for ear discharge or swelling Eyes: Negative for other worsening visual disturbances Respiratory: Negative for stridor or other swelling  Gastrointestinal: Negative for worsening distension or other blood Genitourinary: Negative for retention or other urinary change Musculoskeletal: Negative for other MSK pain or swelling Skin: Negative for color change or other new lesions Neurological: Negative for worsening tremors and other numbness  Psychiatric/Behavioral: Negative for worsening agitation or other fatigue All other system neg per pt    Objective:   Physical Exam BP 118/62   Pulse 67   Ht 6' (1.829 m)   Wt 184 lb (83.5 kg)   SpO2 98%   BMI 24.95 kg/m  VS noted,  Constitutional: Pt appears in NAD HENT: Head: NCAT.  Right Ear: External ear normal.  Left Ear: External ear normal.  Eyes: . Pupils are equal, round, and reactive to light. Conjunctivae and EOM are normal Nose: without d/c or deformity Neck: Neck supple. Gross normal ROM Cardiovascular: Normal rate and regular rhythm.   Pulmonary/Chest: Effort normal and breath sounds without rales or wheezing.  Abd:  Soft, NT, ND, + BS, no organomegaly Neurological: Pt is alert. At baseline orientation, motor grossly intact Skin: Skin is warm. No rashes, other new lesions, no LE edema Psychiatric: Pt behavior is normal without agitation 1-2+ nervous  Lab Results  Component Value Date   HGBA1C 6.8 (H) 01/26/2016   Lab Results  Component Value Date   WBC 6.7 01/27/2016   HGB 14.3 01/27/2016   HCT 42.7 01/27/2016   PLT 185 01/27/2016   GLUCOSE 125 (H) 01/27/2016   CHOL 189 01/26/2016   TRIG 121 01/26/2016    HDL 30 (L) 01/26/2016   LDLCALC 135 (H) 01/26/2016   NA 134 (L) 01/27/2016   K 3.8 01/27/2016   CL 95 (L) 01/27/2016   CREATININE 1.26 (H) 01/27/2016   BUN 23 (H) 01/27/2016   CO2 26 01/27/2016   TSH 2.187 01/26/2016   HGBA1C 6.8 (H) 01/26/2016       Assessment & Plan:

## 2017-03-17 NOTE — Patient Instructions (Signed)
Please continue all other medications as before, and refills have been done if requested.  Please have the pharmacy call with any other refills you may need.  Please keep your appointments with your specialists as you may have planned  Please return in 1 year for your yearly visit, or sooner if needed

## 2017-03-17 NOTE — Progress Notes (Signed)
Pre visit review using our clinic review tool, if applicable. No additional management support is needed unless otherwise documented below in the visit note. 

## 2017-03-17 NOTE — Assessment & Plan Note (Signed)
stable overall by history and exam, recent data reviewed with pt, and pt to continue medical treatment as before,  to f/u any worsening symptoms or concerns Lab Results  Component Value Date   HGBA1C 6.8 (H) 01/26/2016  Has not had a1c in last 6 mo, but declines here due to cost though it can be done POCT today, states will have done at [place]

## 2017-03-17 NOTE — Assessment & Plan Note (Signed)
Chronic persistent stable, for med refill, [facility]

## 2017-03-17 NOTE — Assessment & Plan Note (Signed)
stable overall by history and exam, recent data reviewed with pt, and pt to continue medical treatment as before,  to f/u any worsening symptoms or concerns BP Readings from Last 3 Encounters:  03/17/17 118/62  03/15/17 (!) 149/71  06/01/16 (!) 74/56

## 2017-03-17 NOTE — Assessment & Plan Note (Signed)
Has persistent mild elev LDL 132 with goal < 70, already on high dose statin, for low chol diet, cont meds, declines add zetia

## 2017-03-21 ENCOUNTER — Encounter: Payer: Self-pay | Admitting: Surgery

## 2017-03-21 ENCOUNTER — Ambulatory Visit (INDEPENDENT_AMBULATORY_CARE_PROVIDER_SITE_OTHER): Payer: Non-veteran care | Admitting: Surgery

## 2017-03-21 VITALS — BP 145/75 | HR 58 | Temp 97.4°F | Resp 16 | Ht 72.0 in | Wt 187.0 lb

## 2017-03-21 DIAGNOSIS — I6522 Occlusion and stenosis of left carotid artery: Secondary | ICD-10-CM | POA: Diagnosis not present

## 2017-03-21 NOTE — Progress Notes (Signed)
Vascular and Vein Specialist of [facility]  Patient name: Jesse Hansen MRN: [id number] DOB: September 13, 1946 Sex: male   REASON FOR VISIT:    Carotid stent  HISOTRY OF PRESENT ILLNESS:    Jesse Hansen is a 71 y.o. male who is here today for discussions of carotid stenting.  He saw Dr. early a few weeks ago.  He has a history of a left carotid endarterectomy at the [facility] hospital and [facility].  His postoperative course was complicated by dysphagia and hoarseness.  This eventually resolved.  He had a CT scan that showed a progression of this disease within the endarterectomy site, now greater than 70%.  He is being considered for carotid stenting.  He is asymptomatic.  The patient is medically managed for hypercholesterolemia.  He is on dual antiplatelet therapy.  He is a diabetic.  He is medically managed for hypertension   PAST MEDICAL HISTORY:   Past Medical History:  Diagnosis Date  . ANXIETY 08/23/2009   Qualifier: Diagnosis of  By: Jenny Reichmann MD, [place], HX OF 01/29/2011   Qualifier: Diagnosis of  By: Jenny Reichmann MD, Hunt Oris   . Coronary artery disease involving native coronary artery with angina pectoris ([place]) 02/23/2008   s/p Multiple PCIs; CABG x 3 in 2013 (SVG-OM1, SVG-Diag with freeLIMA-LAD from SVG-Diag hood) --> relook cath in ~2015 with at least 1 graft ~occluded by report  . DIABETES MELLITUS, TYPE II 01/29/2011   Qualifier: Diagnosis of  By: Jenny Reichmann MD, [place] ERECTILE DYSFUNCTION 08/15/2007   Qualifier: Diagnosis of  By: Dance CMA ([place]), Kim    . HYPERLIPIDEMIA 02/23/2008   Qualifier: Diagnosis of  By: Jenny Reichmann MD, Hunt Oris   . HYPERTENSION 08/15/2007   Qualifier: Diagnosis of  By: Dance CMA ([place]), Kim    . INSOMNIA-SLEEP DISORDER-UNSPEC 02/23/2008   Qualifier: Diagnosis of  By: Jenny Reichmann MD, Hunt Oris   . PERIPHERAL VASCULAR DISEASE 02/23/2008   Qualifier: Diagnosis of  By: Jenny Reichmann MD, Hunt Oris   . PTSD 07/30/2010   Qualifier: Diagnosis  of  By: Jenny Reichmann MD, Hunt Oris      FAMILY HISTORY:   Family History  Problem Relation Age of Onset  . Hypertension Father     SOCIAL HISTORY:   Social History  Substance Use Topics  . Smoking status: Current Every Day Smoker    Types: Cigarettes  . Smokeless tobacco: Never Used     Comment: 4-8 cigarettes per day  . Alcohol use 0.0 oz/week     ALLERGIES:   Allergies  Allergen Reactions  . Codeine      CURRENT MEDICATIONS:   Current Outpatient Prescriptions  Medication Sig Dispense Refill  . aspirin EC 81 MG tablet Take 1 tablet (81 mg total) by mouth daily. 90 tablet 11  . atorvastatin (LIPITOR) 80 MG tablet Take 80 mg by mouth daily. Take 0.5 tablet at bedtime.    . Cholecalciferol 10000 units TABS Take by mouth.    . clopidogrel (PLAVIX) 75 MG tablet Take 75 mg by mouth daily.    . furosemide (LASIX) 40 MG tablet Take 1 tablet (40 mg total) by mouth daily. 30 tablet 0  . glipiZIDE (GLUCOTROL) 5 MG tablet Take 5 mg by mouth 2 (two) times daily before a meal. Take 30 minutes prior to meals.    . isosorbide dinitrate (ISORDIL) 20 MG tablet Take 40 mg by mouth 3 (three) times daily.     Marland Kitchen  lisinopril (PRINIVIL,ZESTRIL) 2.5 MG tablet Take 2 tablets (5 mg total) by mouth daily. 30 tablet 0  . LORazepam (ATIVAN) 1 MG tablet TAKE TWO (2) TABLETS BY MOUTH AT BEDTIMEAS NEEDED 60 tablet 5  . metFORMIN (GLUCOPHAGE) 500 MG tablet Take 500 mg by mouth 2 (two) times daily with a meal.    . metoprolol tartrate (LOPRESSOR) 12.5 mg TABS tablet Take 12.5 mg by mouth 2 (two) times daily.    . nitroGLYCERIN (NITROSTAT) 0.4 MG SL tablet Place 0.4 mg under the tongue every 5 (five) minutes as needed for chest pain.    Marland Kitchen omeprazole (PRILOSEC) 20 MG capsule Take 20 mg by mouth daily as needed (heartburn). Reported on 05/18/2016    . ranitidine (ZANTAC) 150 MG tablet Take 150 mg by mouth 2 (two) times daily as needed for heartburn.     No current facility-administered medications for this visit.       REVIEW OF SYSTEMS:   [X]  denotes positive finding, [ ]  denotes negative finding Cardiac  Comments:  Chest pain or chest pressure:    Shortness of breath upon exertion:    Short of breath when lying flat:    Irregular heart rhythm:        Vascular    Pain in calf, thigh, or hip brought on by ambulation:    Pain in feet at night that wakes you up from your sleep:     Blood clot in your veins:    Leg swelling:         Pulmonary    Oxygen at home:    Productive cough:     Wheezing:         Neurologic    Sudden weakness in arms or legs:     Sudden numbness in arms or legs:     Sudden onset of difficulty speaking or slurred speech:    Temporary loss of vision in one eye:     Problems with dizziness:         Gastrointestinal    Blood in stool:     Vomited blood:         Genitourinary    Burning when urinating:     Blood in urine:        Psychiatric    Major depression:         Hematologic    Bleeding problems:    Problems with blood clotting too easily:        Skin    Rashes or ulcers:        Constitutional    Fever or chills:      PHYSICAL EXAM:   Vitals:   03/21/17 1321  BP: (!) 145/75  Pulse: (!) 58  Resp: 16  Temp: 97.4 F (36.3 C)  TempSrc: Oral  SpO2: 97%  Weight: 187 lb (84.8 kg)  Height: 6' (1.829 m)    GENERAL: The patient is a well-nourished male, in no acute distress. The vital signs are documented above. CARDIAC: There is a regular rate and rhythm.  PULMONARY: Non-labored respirations.  MUSCULOSKELETAL: There are no major deformities or cyanosis. NEUROLOGIC: No focal weakness or paresthesias are detected. SKIN: There are no ulcers or rashes noted. PSYCHIATRIC: The patient has a normal affect.  STUDIES:   I have reviewed the patient's CT scan and discussed it with the patient.  There is a high-grade lesion in the distal common carotid artery just proximal to the patch.  MEDICAL ISSUES:   We discussed proceeding with  formal  angiography to better define his lesion and to proceed with left carotid stenting if indicated.  We discussed the risks and benefits of the procedure including the risk of groin complications as well as the risk of stroke.  I discussed the details of the operation with the patient and he is willing to proceed as scheduled on Thursday, May 3.  He will continue to take his aspirin and Plavix.    Annamarie Major, MD Vascular and Vein Specialists of [facility] [phone] Pager [phone]

## 2017-03-24 ENCOUNTER — Other Ambulatory Visit: Payer: Self-pay

## 2017-03-24 ENCOUNTER — Encounter (HOSPITAL_COMMUNITY): Payer: Self-pay | Admitting: Surgery

## 2017-03-24 ENCOUNTER — Encounter (HOSPITAL_COMMUNITY)
Admission: RE | Disposition: A | Discharge status: Home / Self Care | Payer: Self-pay | Source: Ambulatory Visit | Attending: Surgery

## 2017-03-24 ENCOUNTER — Ambulatory Visit (HOSPITAL_COMMUNITY)
Admission: RE | Admit: 2017-03-24 | Discharge: 2017-03-25 | Disposition: A | Discharge status: Home / Self Care | Payer: Non-veteran care | Source: Ambulatory Visit | Attending: Surgery | Admitting: Surgery

## 2017-03-24 DIAGNOSIS — E1151 Type 2 diabetes mellitus with diabetic peripheral angiopathy without gangrene: Secondary | ICD-10-CM | POA: Diagnosis not present

## 2017-03-24 DIAGNOSIS — E7800 Pure hypercholesterolemia, unspecified: Secondary | ICD-10-CM | POA: Insufficient documentation

## 2017-03-24 DIAGNOSIS — Z95828 Presence of other vascular implants and grafts: Secondary | ICD-10-CM

## 2017-03-24 DIAGNOSIS — I6523 Occlusion and stenosis of bilateral carotid arteries: Secondary | ICD-10-CM | POA: Diagnosis not present

## 2017-03-24 DIAGNOSIS — I6522 Occlusion and stenosis of left carotid artery: Secondary | ICD-10-CM

## 2017-03-24 DIAGNOSIS — Z7982 Long term (current) use of aspirin: Secondary | ICD-10-CM | POA: Insufficient documentation

## 2017-03-24 DIAGNOSIS — G4700 Insomnia, unspecified: Secondary | ICD-10-CM | POA: Insufficient documentation

## 2017-03-24 DIAGNOSIS — I708 Atherosclerosis of other arteries: Secondary | ICD-10-CM | POA: Insufficient documentation

## 2017-03-24 DIAGNOSIS — I10 Essential (primary) hypertension: Secondary | ICD-10-CM | POA: Insufficient documentation

## 2017-03-24 DIAGNOSIS — Z7902 Long term (current) use of antithrombotics/antiplatelets: Secondary | ICD-10-CM | POA: Insufficient documentation

## 2017-03-24 DIAGNOSIS — Z951 Presence of aortocoronary bypass graft: Secondary | ICD-10-CM | POA: Insufficient documentation

## 2017-03-24 DIAGNOSIS — Z48812 Encounter for surgical aftercare following surgery on the circulatory system: Secondary | ICD-10-CM

## 2017-03-24 DIAGNOSIS — I25119 Atherosclerotic heart disease of native coronary artery with unspecified angina pectoris: Secondary | ICD-10-CM | POA: Insufficient documentation

## 2017-03-24 DIAGNOSIS — F4310 Post-traumatic stress disorder, unspecified: Secondary | ICD-10-CM | POA: Insufficient documentation

## 2017-03-24 DIAGNOSIS — Z7984 Long term (current) use of oral hypoglycemic drugs: Secondary | ICD-10-CM | POA: Insufficient documentation

## 2017-03-24 DIAGNOSIS — Z9889 Other specified postprocedural states: Secondary | ICD-10-CM

## 2017-03-24 DIAGNOSIS — I6529 Occlusion and stenosis of unspecified carotid artery: Secondary | ICD-10-CM | POA: Diagnosis present

## 2017-03-24 DIAGNOSIS — F17210 Nicotine dependence, cigarettes, uncomplicated: Secondary | ICD-10-CM | POA: Insufficient documentation

## 2017-03-24 HISTORY — PX: CAROTID PTA/STENT INTERVENTION: CATH118231

## 2017-03-24 LAB — POCT I-STAT, CHEM 8
BUN: 22 mg/dL — AB (ref 6–20)
CALCIUM ION: 1.23 mmol/L (ref 1.15–1.40)
CHLORIDE: 101 mmol/L (ref 101–111)
Creatinine, Ser: 1 mg/dL (ref 0.61–1.24)
Glucose, Bld: 156 mg/dL — ABNORMAL HIGH (ref 65–99)
HCT: 41 % (ref 39.0–52.0)
Hemoglobin: 13.9 g/dL (ref 13.0–17.0)
Potassium: 4.2 mmol/L (ref 3.5–5.1)
SODIUM: 138 mmol/L (ref 135–145)
TCO2: 26 mmol/L (ref 0–100)

## 2017-03-24 LAB — GLUCOSE, CAPILLARY
Glucose-Capillary: 172 mg/dL — ABNORMAL HIGH (ref 65–99)
Glucose-Capillary: 130 mg/dL — ABNORMAL HIGH (ref 65–99)

## 2017-03-24 LAB — POCT ACTIVATED CLOTTING TIME: ACTIVATED CLOTTING TIME: 362 s

## 2017-03-24 SURGERY — CAROTID PTA/STENT INTERVENTION
Anesthesia: LOCAL | Laterality: Left

## 2017-03-24 MED ORDER — ACETAMINOPHEN 325 MG PO TABS: 325.0000 mg | ORAL_TABLET | ORAL | Status: DC | PRN

## 2017-03-24 MED ORDER — LISINOPRIL 2.5 MG PO TABS
2.5000 mg | ORAL_TABLET | Freq: Every day | ORAL | Status: DC
  Administered 2017-03-25: 2.5 mg via ORAL
  Filled 2017-03-24: qty 1

## 2017-03-24 MED ORDER — SODIUM CHLORIDE 0.9 % IV SOLN: 500.0000 mL | Freq: Once | INTRAVENOUS | Status: DC | PRN

## 2017-03-24 MED ORDER — VITAMIN D3 25 MCG (1000 UNIT) PO TABS
1000.0000 [IU] | ORAL_TABLET | Freq: Every day | ORAL | Status: DC
  Filled 2017-03-24 (×2): qty 1

## 2017-03-24 MED ORDER — ISOSORBIDE DINITRATE 20 MG PO TABS
40.0000 mg | ORAL_TABLET | Freq: Three times a day (TID) | ORAL | Status: DC
  Administered 2017-03-24 – 2017-03-25 (×4): 40 mg via ORAL
  Filled 2017-03-24 (×4): qty 2

## 2017-03-24 MED ORDER — ALUM & MAG HYDROXIDE-SIMETH 200-200-20 MG/5ML PO SUSP: 15.0000 mL | ORAL | Status: DC | PRN

## 2017-03-24 MED ORDER — LIDOCAINE HCL (PF) 1 % IJ SOLN
INTRAMUSCULAR | Status: DC | PRN
  Administered 2017-03-24: 15 mL

## 2017-03-24 MED ORDER — CLOPIDOGREL BISULFATE 75 MG PO TABS
75.0000 mg | ORAL_TABLET | Freq: Every day | ORAL | Status: DC
  Administered 2017-03-25: 75 mg via ORAL
  Filled 2017-03-24: qty 1

## 2017-03-24 MED ORDER — LABETALOL HCL 5 MG/ML IV SOLN: 10.0000 mg | INTRAVENOUS | Status: DC | PRN

## 2017-03-24 MED ORDER — ACETAMINOPHEN 325 MG RE SUPP
325.0000 mg | RECTAL | Status: DC | PRN
  Filled 2017-03-24: qty 2

## 2017-03-24 MED ORDER — ASPIRIN 81 MG PO CHEW
81.0000 mg | CHEWABLE_TABLET | Freq: Once | ORAL | Status: AC
Start: 2017-03-24 — End: 2017-03-24
  Administered 2017-03-24: 81 mg via ORAL

## 2017-03-24 MED ORDER — ONDANSETRON HCL 4 MG/2ML IJ SOLN: 4.0000 mg | Freq: Four times a day (QID) | INTRAMUSCULAR | Status: DC | PRN

## 2017-03-24 MED ORDER — HEPARIN (PORCINE) IN NACL 2-0.9 UNIT/ML-% IJ SOLN
INTRAMUSCULAR | Status: DC | PRN
  Administered 2017-03-24: 1000 mL

## 2017-03-24 MED ORDER — DOCUSATE SODIUM 100 MG PO CAPS
100.0000 mg | ORAL_CAPSULE | Freq: Every day | ORAL | Status: DC
  Administered 2017-03-25: 100 mg via ORAL
  Filled 2017-03-24: qty 1

## 2017-03-24 MED ORDER — ATORVASTATIN CALCIUM 40 MG PO TABS
40.0000 mg | ORAL_TABLET | Freq: Every evening | ORAL | Status: DC
  Administered 2017-03-24: 40 mg via ORAL
  Filled 2017-03-24: qty 1

## 2017-03-24 MED ORDER — GUAIFENESIN-DM 100-10 MG/5ML PO SYRP: 15.0000 mL | ORAL_SOLUTION | ORAL | Status: DC | PRN

## 2017-03-24 MED ORDER — FAMOTIDINE 20 MG PO TABS
20.0000 mg | ORAL_TABLET | Freq: Every day | ORAL | Status: DC
  Administered 2017-03-24: 20 mg via ORAL
  Filled 2017-03-24: qty 1

## 2017-03-24 MED ORDER — METOPROLOL TARTRATE 5 MG/5ML IV SOLN: 2.0000 mg | INTRAVENOUS | Status: DC | PRN

## 2017-03-24 MED ORDER — LORAZEPAM 1 MG PO TABS
1.0000 mg | ORAL_TABLET | Freq: Every evening | ORAL | Status: DC | PRN
  Administered 2017-03-24: 2 mg via ORAL
  Filled 2017-03-24: qty 2

## 2017-03-24 MED ORDER — GLIPIZIDE 5 MG PO TABS
5.0000 mg | ORAL_TABLET | Freq: Two times a day (BID) | ORAL | Status: DC
  Administered 2017-03-24 – 2017-03-25 (×2): 5 mg via ORAL
  Filled 2017-03-24 (×2): qty 1

## 2017-03-24 MED ORDER — BIVALIRUDIN TRIFLUOROACETATE 250 MG IV SOLR
INTRAVENOUS | Status: AC
  Filled 2017-03-24: qty 250

## 2017-03-24 MED ORDER — SODIUM CHLORIDE 0.9 % IV SOLN
INTRAVENOUS | Status: DC | PRN
  Administered 2017-03-24: 1.75 mg/kg/h via INTRAVENOUS

## 2017-03-24 MED ORDER — BIVALIRUDIN BOLUS VIA INFUSION - CUPID
INTRAVENOUS | Status: DC | PRN
  Administered 2017-03-24: 63.75 mg via INTRAVENOUS

## 2017-03-24 MED ORDER — METOPROLOL TARTRATE 12.5 MG HALF TABLET
12.5000 mg | ORAL_TABLET | Freq: Two times a day (BID) | ORAL | Status: DC
  Administered 2017-03-25: 12.5 mg via ORAL
  Filled 2017-03-24 (×2): qty 1

## 2017-03-24 MED ORDER — SODIUM CHLORIDE 0.9 % IV SOLN
INTRAVENOUS | Status: DC
  Administered 2017-03-24: 06:00:00 via INTRAVENOUS

## 2017-03-24 MED ORDER — SODIUM CHLORIDE 0.9 % IV SOLN: INTRAVENOUS | Status: DC

## 2017-03-24 MED ORDER — LIDOCAINE HCL 1 % IJ SOLN
INTRAMUSCULAR | Status: AC
  Filled 2017-03-24: qty 20

## 2017-03-24 MED ORDER — ASPIRIN EC 81 MG PO TBEC
81.0000 mg | DELAYED_RELEASE_TABLET | Freq: Every day | ORAL | Status: DC
  Administered 2017-03-24 – 2017-03-25 (×2): 81 mg via ORAL
  Filled 2017-03-24 (×3): qty 1

## 2017-03-24 MED ORDER — PHENOL 1.4 % MT LIQD: 1.0000 | OROMUCOSAL | Status: DC | PRN

## 2017-03-24 MED ORDER — HEPARIN (PORCINE) IN NACL 2-0.9 UNIT/ML-% IJ SOLN
INTRAMUSCULAR | Status: AC
  Filled 2017-03-24: qty 1000

## 2017-03-24 MED ORDER — METFORMIN HCL 500 MG PO TABS
500.0000 mg | ORAL_TABLET | Freq: Two times a day (BID) | ORAL | Status: DC
  Administered 2017-03-24: 500 mg via ORAL
  Filled 2017-03-24: qty 1

## 2017-03-24 MED ORDER — ASPIRIN 81 MG PO CHEW
CHEWABLE_TABLET | ORAL | Status: AC
  Administered 2017-03-24: 81 mg via ORAL
  Filled 2017-03-24: qty 1

## 2017-03-24 MED ORDER — HYDRALAZINE HCL 20 MG/ML IJ SOLN: 5.0000 mg | INTRAMUSCULAR | Status: DC | PRN

## 2017-03-24 MED ORDER — HEPARIN SODIUM (PORCINE) 1000 UNIT/ML IJ SOLN
INTRAMUSCULAR | Status: AC
  Filled 2017-03-24: qty 1

## 2017-03-24 MED ORDER — NITROGLYCERIN 0.4 MG SL SUBL
0.4000 mg | SUBLINGUAL_TABLET | SUBLINGUAL | Status: DC | PRN
  Administered 2017-03-25: 0.4 mg via SUBLINGUAL
  Filled 2017-03-24: qty 1

## 2017-03-24 MED ORDER — FUROSEMIDE 20 MG PO TABS
20.0000 mg | ORAL_TABLET | Freq: Every day | ORAL | Status: DC
  Administered 2017-03-25: 20 mg via ORAL
  Filled 2017-03-24 (×2): qty 1

## 2017-03-24 MED ORDER — IODIXANOL 320 MG/ML IV SOLN
INTRAVENOUS | Status: DC | PRN
  Administered 2017-03-24: 125 mL via INTRA_ARTERIAL

## 2017-03-24 SURGICAL SUPPLY — 28 items
BALLN EMERGE MR 3.0X20 (BALLOONS) ×2
BALLN VIATRAC 5.5X20X135 (BALLOONS) ×2
BALLOON EMERGE MR 3.0X20 (BALLOONS) IMPLANT
BALLOON VIATRAC 5.5X20X135 (BALLOONS) IMPLANT
CATH ANGIO 5F BER2 100CM (CATHETERS) ×1 IMPLANT
CATH ANGIO 5F PIGTAIL 100CM (CATHETERS) ×1 IMPLANT
CATH HEADHUNTER 5F 125CM (CATHETERS) ×1 IMPLANT
COVER PRB 48X5XTLSCP FOLD TPE (BAG) IMPLANT
COVER PROBE 5X48 (BAG) ×2
DEVICE CONTINUOUS FLUSH (MISCELLANEOUS) ×1 IMPLANT
DEVICE EMBOSHIELD NAV6 4.0-7.0 (WIRE) ×1 IMPLANT
KIT ENCORE 26 ADVANTAGE (KITS) ×1 IMPLANT
KIT MICROINTRODUCER STIFF 5F (SHEATH) ×1 IMPLANT
KIT PV (KITS) ×2 IMPLANT
SHEATH PINNACLE 5F 10CM (SHEATH) ×1 IMPLANT
SHEATH PINNACLE 6F 10CM (SHEATH) ×1 IMPLANT
SHEATH PINNACLE 7F 10CM (SHEATH) ×1 IMPLANT
SHEATH PINNACLE 8F 10CM (SHEATH) ×1 IMPLANT
SHEATH SHUTTLE SELECT 6F (SHEATH) ×1 IMPLANT
SHIELD RADPAD SCOOP 12X17 (MISCELLANEOUS) ×1 IMPLANT
STENT XACT CAR 10X30X136 (Permanent Stent) ×1 IMPLANT
STOPCOCK MORSE 400PSI 3WAY (MISCELLANEOUS) ×1 IMPLANT
SYRINGE MEDRAD AVANTA MACH 7 (SYRINGE) ×1 IMPLANT
TRANSDUCER W/STOPCOCK (MISCELLANEOUS) ×2 IMPLANT
TRAY PV CATH (CUSTOM PROCEDURE TRAY) ×2 IMPLANT
TUBING CIL FLEX 10 FLL-RA (TUBING) ×1 IMPLANT
WIRE AMPLATZ SSTIFF .035X260CM (WIRE) ×1 IMPLANT
WIRE BENTSON .035X145CM (WIRE) ×1 IMPLANT

## 2017-03-24 NOTE — Op Note (Signed)
Patient name: Jesse Hansen MRN: [id number] DOB: [birth year] Sex: male  03/24/2017 Pre-operative Diagnosis: Recurrent left carotid stenosis Post-operative diagnosis:  Same Surgeon:  Milderd Meager Procedure Performed:  1.  Ultrasound-guided access, right femoral artery  2.  Aortic arch angiogram  3.  Second order catheterization, (right common carotid artery)  4.  Right carotid angiogram  5.  Left carotid stent with distal embolic protection    Indications:  The patient has a history of a left carotid endarterectomy.  His postoperative course was complicated by significant dysphagia and nerve neuropraxia.  He has developed a recurrent stenosis.  He has a known occlusion on the right.  He is here today for possible intervention.  Procedure:  The patient was identified in the holding area and taken to room 8.  The patient was then placed supine on the table and prepped and draped in the usual sterile fashion.  A time out was called.  Ultrasound was used to evaluate the right common femoral artery.  It was patent .  A digital ultrasound image was acquired.  A micropuncture needle was used to access the right common femoral artery under ultrasound guidance.  An 018 wire was advanced without resistance and a micropuncture sheath was placed.  The 018 wire was removed and a benson wire was placed.  The micropuncture sheath was exchanged for a 5 french sheath.  A pigtail catheter was advanced into the ascending aorta and an aortic arch Joetta Manners was performed.  Next, a Berenstein 2 catheter was used to select the innominate artery and navigated into the right common carotid artery and a right common carotid Angie Phillip Heal was performed.  Next the [facility] 2 catheter was used to select the left common carotid artery and left carotid Angie Phillip Heal was performed.  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  Ostial calcification of the great vessels is noted in the innominate artery and left carotid artery without significant stenosis.  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  There is a large dominant right vertebral artery.  Right carotid:  The right external carotid artery is patent.  The right internal carotid artery is occluded and common carotid stenosis is identified.  Left carotid:  Patulous dilatation of the left distal common and internal carotid artery is noted from prior surgery.  There is an 80% stenosis at the origin of the dilatation.  Intervention:  At this point, we decided to proceed with intervention.  A 6 French 90 cm sheath was advanced over a Amplatz superstiff wire into the left common carotid artery.  A Angiomax bolus and continuous infusion was administered.  ACT was confirmed to be greater than 300.  Next, a large NAV 6 filter was prepared and then inserted and deployed in the straight portion of the distal internal carotid artery.  A 3 x 2 balloon was used to dilate the stenosis.  No hemodynamic changes were noted.  A 10 x 10 x 30 XACT stent was selected and then deployed landing in the patch and coming back across the stenosis into the common carotid artery.  This was dilated with a 5.5 x 2 balloon.  Completion imaging showed no residual stenosis.  There was no debris in the filter which was retrieved.  Intracranial imaging was completed.  The long sheath was changed out for a short 6 [place] sheath.  The patient remained neurologically  intact.  There were no immediate complications.  Impression:  #1  80% left carotid stenosis successfully treated with carotid stenting using distal embolic protection and a 10 x 10 x 30 stent with no residual stenosis  #2  occluded left leg artery  #3  occluded right internal carotid artery    V. Annamarie Major, M.D. [facility] Vein Specialists of [place] Office: [phone] Pager:  [phone]

## 2017-03-24 NOTE — Progress Notes (Signed)
Site area: Right groin a 6 french arterial sheath was removed  Site Prior to Removal:  Level 0  Pressure Applied For 20 MINUTES    Bedrest Beginning at 1130am  Manual:   Yes.    Patient Status During Pull:  stable  Post Pull Groin Site:  Level 0  Post Pull Instructions Given:  Yes.    Post Pull Pulses Present:  Yes.    Dressing Applied:  Yes.    Comments:  VS remain stable during sheath pull.

## 2017-03-24 NOTE — H&P (View-Only) (Signed)
Vascular and Vein Specialist of [facility]  Patient name: Jesse Hansen MRN: [id number] DOB: 1946/10/19 Sex: male   REASON FOR VISIT:    Carotid stent  HISOTRY OF PRESENT ILLNESS:    Jesse Hansen is a 71 y.o. male who is here today for discussions of carotid stenting.  He saw Dr. early a few weeks ago.  He has a history of a left carotid endarterectomy at the [facility] hospital and [facility].  His postoperative course was complicated by dysphagia and hoarseness.  This eventually resolved.  He had a CT scan that showed a progression of this disease within the endarterectomy site, now greater than 70%.  He is being considered for carotid stenting.  He is asymptomatic.  The patient is medically managed for hypercholesterolemia.  He is on dual antiplatelet therapy.  He is a diabetic.  He is medically managed for hypertension   PAST MEDICAL HISTORY:   Past Medical History:  Diagnosis Date  . ANXIETY 08/23/2009   Qualifier: Diagnosis of  By: Jenny Reichmann MD, [place], HX OF 01/29/2011   Qualifier: Diagnosis of  By: Jenny Reichmann MD, Hunt Oris   . Coronary artery disease involving native coronary artery with angina pectoris ([place]) 02/23/2008   s/p Multiple PCIs; CABG x 3 in 2013 (SVG-OM1, SVG-Diag with freeLIMA-LAD from SVG-Diag hood) --> relook cath in ~2015 with at least 1 graft ~occluded by report  . DIABETES MELLITUS, TYPE II 01/29/2011   Qualifier: Diagnosis of  By: Jenny Reichmann MD, [place] ERECTILE DYSFUNCTION 08/15/2007   Qualifier: Diagnosis of  By: Dance CMA ([place]), Kim    . HYPERLIPIDEMIA 02/23/2008   Qualifier: Diagnosis of  By: Jenny Reichmann MD, Hunt Oris   . HYPERTENSION 08/15/2007   Qualifier: Diagnosis of  By: Dance CMA ([place]), Kim    . INSOMNIA-SLEEP DISORDER-UNSPEC 02/23/2008   Qualifier: Diagnosis of  By: Jenny Reichmann MD, Hunt Oris   . PERIPHERAL VASCULAR DISEASE 02/23/2008   Qualifier: Diagnosis of  By: Jenny Reichmann MD, Hunt Oris   . PTSD 07/30/2010   Qualifier: Diagnosis  of  By: Jenny Reichmann MD, Hunt Oris      FAMILY HISTORY:   Family History  Problem Relation Age of Onset  . Hypertension Father     SOCIAL HISTORY:   Social History  Substance Use Topics  . Smoking status: Current Every Day Smoker    Types: Cigarettes  . Smokeless tobacco: Never Used     Comment: 4-8 cigarettes per day  . Alcohol use 0.0 oz/week     ALLERGIES:   Allergies  Allergen Reactions  . Codeine      CURRENT MEDICATIONS:   Current Outpatient Prescriptions  Medication Sig Dispense Refill  . aspirin EC 81 MG tablet Take 1 tablet (81 mg total) by mouth daily. 90 tablet 11  . atorvastatin (LIPITOR) 80 MG tablet Take 80 mg by mouth daily. Take 0.5 tablet at bedtime.    . Cholecalciferol 10000 units TABS Take by mouth.    . clopidogrel (PLAVIX) 75 MG tablet Take 75 mg by mouth daily.    . furosemide (LASIX) 40 MG tablet Take 1 tablet (40 mg total) by mouth daily. 30 tablet 0  . glipiZIDE (GLUCOTROL) 5 MG tablet Take 5 mg by mouth 2 (two) times daily before a meal. Take 30 minutes prior to meals.    . isosorbide dinitrate (ISORDIL) 20 MG tablet Take 40 mg by mouth 3 (three) times daily.     Marland Kitchen  lisinopril (PRINIVIL,ZESTRIL) 2.5 MG tablet Take 2 tablets (5 mg total) by mouth daily. 30 tablet 0  . LORazepam (ATIVAN) 1 MG tablet TAKE TWO (2) TABLETS BY MOUTH AT BEDTIMEAS NEEDED 60 tablet 5  . metFORMIN (GLUCOPHAGE) 500 MG tablet Take 500 mg by mouth 2 (two) times daily with a meal.    . metoprolol tartrate (LOPRESSOR) 12.5 mg TABS tablet Take 12.5 mg by mouth 2 (two) times daily.    . nitroGLYCERIN (NITROSTAT) 0.4 MG SL tablet Place 0.4 mg under the tongue every 5 (five) minutes as needed for chest pain.    Marland Kitchen omeprazole (PRILOSEC) 20 MG capsule Take 20 mg by mouth daily as needed (heartburn). Reported on 05/18/2016    . ranitidine (ZANTAC) 150 MG tablet Take 150 mg by mouth 2 (two) times daily as needed for heartburn.     No current facility-administered medications for this visit.       REVIEW OF SYSTEMS:   [X]  denotes positive finding, [ ]  denotes negative finding Cardiac  Comments:  Chest pain or chest pressure:    Shortness of breath upon exertion:    Short of breath when lying flat:    Irregular heart rhythm:        Vascular    Pain in calf, thigh, or hip brought on by ambulation:    Pain in feet at night that wakes you up from your sleep:     Blood clot in your veins:    Leg swelling:         Pulmonary    Oxygen at home:    Productive cough:     Wheezing:         Neurologic    Sudden weakness in arms or legs:     Sudden numbness in arms or legs:     Sudden onset of difficulty speaking or slurred speech:    Temporary loss of vision in one eye:     Problems with dizziness:         Gastrointestinal    Blood in stool:     Vomited blood:         Genitourinary    Burning when urinating:     Blood in urine:        Psychiatric    Major depression:         Hematologic    Bleeding problems:    Problems with blood clotting too easily:        Skin    Rashes or ulcers:        Constitutional    Fever or chills:      PHYSICAL EXAM:   Vitals:   03/21/17 1321  BP: (!) 145/75  Pulse: (!) 58  Resp: 16  Temp: 97.4 F (36.3 C)  TempSrc: Oral  SpO2: 97%  Weight: 187 lb (84.8 kg)  Height: 6' (1.829 m)    GENERAL: The patient is a well-nourished male, in no acute distress. The vital signs are documented above. CARDIAC: There is a regular rate and rhythm.  PULMONARY: Non-labored respirations.  MUSCULOSKELETAL: There are no major deformities or cyanosis. NEUROLOGIC: No focal weakness or paresthesias are detected. SKIN: There are no ulcers or rashes noted. PSYCHIATRIC: The patient has a normal affect.  STUDIES:   I have reviewed the patient's CT scan and discussed it with the patient.  There is a high-grade lesion in the distal common carotid artery just proximal to the patch.  MEDICAL ISSUES:   We discussed proceeding with  formal  angiography to better define his lesion and to proceed with left carotid stenting if indicated.  We discussed the risks and benefits of the procedure including the risk of groin complications as well as the risk of stroke.  I discussed the details of the operation with the patient and he is willing to proceed as scheduled on Thursday, May 3.  He will continue to take his aspirin and Plavix.    Annamarie Major, MD Vascular and Vein Specialists of [facility] [phone] Pager [phone]

## 2017-03-24 NOTE — Interval H&P Note (Signed)
History and Physical Interval Note:  03/24/2017 7:24 AM  Jesse Hansen  has presented today for surgery, with the diagnosis of left carotid stenosis  The various methods of treatment have been discussed with the patient and family. After consideration of risks, benefits and other options for treatment, the patient has consented to  Procedure(s): Carotid PTA/Stent Intervention (Left) as a surgical intervention .  The patient's history has been reviewed, patient examined, no change in status, stable for surgery.  I have reviewed the patient's chart and labs.  Questions were answered to the patient's satisfaction.     Annamarie Major

## 2017-03-25 ENCOUNTER — Telehealth: Payer: Self-pay | Admitting: Surgery

## 2017-03-25 MED ORDER — LORAZEPAM 1 MG PO TABS: 2.0000 mg | ORAL_TABLET | Freq: Every day | ORAL | Status: DC

## 2017-03-25 MED ORDER — METFORMIN HCL 500 MG PO TABS: 500.0000 mg | ORAL_TABLET | Freq: Two times a day (BID) | ORAL | Status: DC

## 2017-03-25 NOTE — Progress Notes (Signed)
  Vascular and Vein Specialists Progress Note  Subjective  - POD #1  Right upper thigh a little sore. Yesterday, reported an episode of not being able to move his left leg in addition to numbness. This resolved within a few minutes. No other neurological symptoms.   Objective Vitals:   03/24/17 2257 03/25/17 0352  BP: (!) 114/52 (!) 110/51  Pulse: (!) 53 (!) 53  Resp: 12 13  Temp: 98.2 F (36.8 C) 98 F (36.7 C)    Intake/Output Summary (Last 24 hours) at 03/25/17 0737 Last data filed at 03/25/17 0355  Gross per 24 hour  Intake              360 ml  Output              550 ml  Net             -190 ml   Right groin without hematoma. Minimal ecchymosis at sheath site.  Right foot warm.  5/5 strength upper and lower extremities bilaterally.   Assessment/Planning: 71 y.o. male is s/p: left carotid stent for recurrent left carotid stenosis 1 Day Post-Op   Neuro exam intact. Brief period of left leg paralysis and numbness likely secondary to positioning.  Has ambulated, voided and tolerated diet without difficulty. D/c home today. On aspirin and plavix. F/u in 4 weeks with carotid duplex.   Alvia Grove 03/25/2017 7:37 AM --  Laboratory CBC    Component Value Date/Time   WBC 6.7 01/27/2016 0431   HGB 13.9 03/24/2017 0619   HCT 41.0 03/24/2017 0619   PLT 185 01/27/2016 0431    BMET    Component Value Date/Time   NA 138 03/24/2017 0619   K 4.2 03/24/2017 0619   CL 101 03/24/2017 0619   CO2 26 01/27/2016 0431   GLUCOSE 156 (H) 03/24/2017 0619   BUN 22 (H) 03/24/2017 0619   CREATININE 1.00 03/24/2017 0619   CALCIUM 9.5 01/27/2016 0431   GFRNONAA 56 (L) 01/27/2016 0431   GFRAA >60 01/27/2016 0431    COAG No results found for: INR, PROTIME No results found for: PTT  Antibiotics Anti-infectives    None       Virgina Jock, PA-C [facility] Office: [phone] Pager: [phone] 03/25/2017 7:37 AM    Neuro intact s/p carotid  stent.  Ready for discharge  [facility]

## 2017-03-25 NOTE — Telephone Encounter (Signed)
-----   Message from Denman George, RN sent at 03/24/2017  1:10 PM EDT ----- Regarding: needs one month f/u with Dr. Trula Slade and left carotid ultrasound   ----- Message ----- From: Serafina Mitchell, MD Sent: 03/24/2017   9:14 AM To: Vvs Charge Pool  03-24-2017: Surgeon:  Milderd Meager Procedure Performed:  1.  Ultrasound-guided access, right femoral artery  2.  Aortic arch angiogram  3.  Second order catheterization, (right common carotid artery)  4.  Right carotid angiogram  5.  Left carotid stent with distal embolic protection Follow-up one month with carotid duplex  Please disregard previous message as it was on Tia Masker, not this patient.  This has been corrected in the chart.

## 2017-03-25 NOTE — Care Management Note (Signed)
Case Management Note  Patient Details  Name: Jesse Hansen MRN: [id number] Date of Birth: 19-May-1946  Subjective/Objective:   From home, s/p  left carotid stent for recurrent left carotid stenosis, for dc today.              Action/Plan:   Expected Discharge Date:  03/25/17               Expected Discharge Plan:  Home/Self Care  In-House Referral:     Discharge planning Services  CM Consult  Post Acute Care Choice:    Choice offered to:     DME Arranged:    DME Agency:     HH Arranged:    HH Agency:     Status of Service:  Completed, signed off  If discussed at [employer] of Stay Meetings, dates discussed:    Additional Comments:  Zenon Mayo, RN 03/25/2017, 9:26 AM

## 2017-03-25 NOTE — Telephone Encounter (Signed)
Sched lab 05/05/17 at 12:00 and MD 05/09/17 at 12:15. Pt's ph# has no vm, emerg contact's # same #. Mailed appt letters through regular mail to inform pt of appts.

## 2017-03-25 NOTE — Progress Notes (Signed)
Pt d/c home per MD order, pt d/c instructions given, family at [facility], all questions answered, pt VSS

## 2017-05-02 ENCOUNTER — Encounter: Payer: Self-pay | Admitting: Surgery

## 2017-05-05 ENCOUNTER — Encounter (HOSPITAL_COMMUNITY): Payer: Non-veteran care

## 2017-05-06 ENCOUNTER — Ambulatory Visit (HOSPITAL_COMMUNITY)
Admission: RE | Admit: 2017-05-06 | Discharge: 2017-05-06 | Disposition: A | Discharge status: Home / Self Care | Payer: Non-veteran care | Source: Ambulatory Visit | Attending: Surgery | Admitting: Surgery

## 2017-05-06 DIAGNOSIS — Z95828 Presence of other vascular implants and grafts: Secondary | ICD-10-CM | POA: Diagnosis not present

## 2017-05-06 DIAGNOSIS — Z9889 Other specified postprocedural states: Secondary | ICD-10-CM | POA: Diagnosis not present

## 2017-05-06 DIAGNOSIS — I6521 Occlusion and stenosis of right carotid artery: Secondary | ICD-10-CM | POA: Diagnosis not present

## 2017-05-06 DIAGNOSIS — Z48812 Encounter for surgical aftercare following surgery on the circulatory system: Secondary | ICD-10-CM | POA: Diagnosis not present

## 2017-05-06 LAB — VAS US CAROTID
LCCAPSYS: 151 cm/s
LEFT ECA DIAS: -22 cm/s
Left CCA dist dias: -32 cm/s
Left CCA dist sys: -97 cm/s
Left CCA prox dias: 36 cm/s
Left ICA dist dias: -29 cm/s
Left ICA dist sys: -91 cm/s
Left ICA prox dias: -36 cm/s
Left ICA prox sys: -152 cm/s
RCCAPDIAS: 0 cm/s
RIGHT CCA MID DIAS: 0 cm/s
RIGHT ECA DIAS: 0 cm/s
RIGHT VERTEBRAL DIAS: -24 cm/s
Right CCA prox sys: 174 cm/s

## 2017-05-09 ENCOUNTER — Encounter: Payer: Self-pay | Admitting: Surgery

## 2017-05-09 ENCOUNTER — Ambulatory Visit (INDEPENDENT_AMBULATORY_CARE_PROVIDER_SITE_OTHER): Payer: Self-pay | Admitting: Surgery

## 2017-05-09 VITALS — BP 137/79 | HR 58 | Temp 98.4°F | Resp 16 | Ht 72.0 in | Wt 186.0 lb

## 2017-05-09 DIAGNOSIS — I6522 Occlusion and stenosis of left carotid artery: Secondary | ICD-10-CM

## 2017-05-09 NOTE — Progress Notes (Signed)
Patient name: Jesse Hansen MRN: [id number] DOB: May 01, 1946 Sex: male  REASON FOR VISIT:     s/p carotid stent  HISTORY OF PRESENT ILLNESS:   Jesse Hansen is a 71 y.o. male returns today after undergoing left carotid stenting on 05/09/2017.  He is originally seen by Dr. early.  He has a history of left carotid endarterectomy at the [facility] in [facility]  His postoperative course was complicated by dysphagia and hoarseness which eventually resolved.  The patient tolerated his procedure well.  He had mild ecchymosis in his right groin.  He has not had any neurologic deficits.  CURRENT MEDICATIONS:    Current Outpatient Prescriptions  Medication Sig Dispense Refill  . aspirin EC 81 MG tablet Take 1 tablet (81 mg total) by mouth daily. (Patient taking differently: Take 81 mg by mouth at bedtime. ) 90 tablet 11  . atorvastatin (LIPITOR) 80 MG tablet Take 40 mg by mouth every evening.     . cholecalciferol (VITAMIN D) 1000 units tablet Take 1,000 Units by mouth at bedtime.    . clopidogrel (PLAVIX) 75 MG tablet Take 75 mg by mouth daily.    . furosemide (LASIX) 20 MG tablet Take 20 mg by mouth daily.    Marland Kitchen glipiZIDE (GLUCOTROL) 5 MG tablet Take 5 mg by mouth 2 (two) times daily before a meal. Take 30 minutes prior to meals.    . isosorbide dinitrate (ISORDIL) 20 MG tablet Take 40 mg by mouth 3 (three) times daily.     Marland Kitchen lisinopril (PRINIVIL,ZESTRIL) 5 MG tablet Take 2.5 mg by mouth daily.    Marland Kitchen LORazepam (ATIVAN) 1 MG tablet Take 2 tablets (2 mg total) by mouth at bedtime.    . metFORMIN (GLUCOPHAGE) 500 MG tablet Take 1 tablet (500 mg total) by mouth 2 (two) times daily with a meal.    . metoprolol tartrate (LOPRESSOR) 25 MG tablet Take 12.5 mg by mouth 2 (two) times daily.    . nitroGLYCERIN (NITROSTAT) 0.4 MG SL tablet Place 0.4 mg under the tongue every 5 (five) minutes as needed for chest pain.    . ranitidine (ZANTAC) 150 MG tablet  Take 150 mg by mouth 2 (two) times daily as needed for heartburn.     No current facility-administered medications for this visit.     REVIEW OF SYSTEMS:   [X]  denotes positive finding, [ ]  denotes negative finding Cardiac  Comments:  Chest pain or chest pressure:    Shortness of breath upon exertion:    Short of breath when lying flat:    Irregular heart rhythm:    Constitutional    Fever or chills:      PHYSICAL EXAM:   Vitals:   05/09/17 1217  BP: 137/79  Pulse: (!) 58  Resp: 16  Temp: 98.4 F (36.9 C)  TempSrc: Oral  SpO2: 96%  Weight: 186 lb (84.4 kg)  Height: 6' (1.829 m)    GENERAL: The patient is a well-nourished male, in no acute distress. The vital signs are documented above. CARDIOVASCULAR: There is a regular rate and rhythm. PULMONARY: Non-labored respirations Right groin has well healed without evidence of pseudoaneurysm. Neurologically intact  STUDIES:   I have reviewed his carotid duplex which shows a widely patent left carotid endarterectomy site.  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   MEDICAL ISSUES:   Status post left carotid stenting for recurrent asymptomatic stenosis.  The patient is doing very well.  His initial  postprocedure ultrasound shows a widely patent stent.  He will need to continue his aspirin and Plavix.  I haven't scheduled for follow-up in 6 months with a repeat ultrasound.  Annamarie Major, MD [facility] [phone] Pager [phone]

## 2017-05-10 NOTE — Addendum Note (Signed)
Addended by: Lianne Cure A on: 05/10/2017 03:50 PM   Modules accepted: Orders

## 2017-07-13 ENCOUNTER — Telehealth: Payer: Self-pay

## 2017-07-13 NOTE — Telephone Encounter (Signed)
Pt called in to report slight R groin pain and swelling w/ red/blue color that started today. Pt states the area is tender to touch, only hurts when touched. Also reported lifting a 25 pound bag of dog food on Monday 8/19. Pt denies fever, chills, drainage and N&V. Pt states area is soft to the touch with no hardened areas and has not noticed a difference when he uses the bathroom. Pt also states that the area feels much better than earlier this morning as I had to call the pt back later in the afternoon but sx were first reported this morning. Offered to move his appointment up but pt stated he thinks he is ok and will call if he needs [place]. Advised pt to call our office if the area becomes hardened or more painful. Also advised if he begins running a fever, has N&V or area becomes more concerning to give our office a call. Pt verbalized understanding and agrees with this plan.

## 2017-07-15 ENCOUNTER — Telehealth: Payer: Self-pay

## 2017-07-15 NOTE — Telephone Encounter (Signed)
Pt called in to report that his wife examined his R groin and noticed an eraser sized raised area with pus under the skin. Pt stated that when he got out of bed this morning he felt a slight sting in that area when he stood up and put weight on his R leg. His wife took his temp and it was 96.7, pt denies chills. Wife felt area and stated no knots or hardness was noted, She stated the size had increased slightly from a couple of days ago. Pt denies pain but reports feeling slightly nauseated after eating a banana sandwhich but has taken zantac with no relief and says he will try pepto bismol to get relief. After speaking with Zigmund Daniel RN she advised that the pt come in to see our NP on Monday and apply warm compresses in the mean time. Gave instructions to pt and also advised that if the area does start to drain to clean and cover with a dry gauze. Also if he starts to run a temperature, have chills, have increased pain, or the area changes drastically to give our office a call immediately. Pt gave verbal understanding and agrees with this plan.

## 2017-07-18 ENCOUNTER — Ambulatory Visit: Payer: Non-veteran care | Admitting: Family

## 2017-07-18 ENCOUNTER — Encounter: Payer: Self-pay | Admitting: Family

## 2017-09-02 ENCOUNTER — Other Ambulatory Visit: Payer: Self-pay | Admitting: Internal Medicine

## 2017-09-02 NOTE — Telephone Encounter (Signed)
Done hardcopy to Shirron  

## 2017-09-02 NOTE — Telephone Encounter (Signed)
Faxed

## 2017-11-10 ENCOUNTER — Ambulatory Visit: Payer: Non-veteran care | Admitting: Family

## 2017-11-10 ENCOUNTER — Encounter (HOSPITAL_COMMUNITY): Payer: Non-veteran care

## 2018-01-02 ENCOUNTER — Encounter (HOSPITAL_BASED_OUTPATIENT_CLINIC_OR_DEPARTMENT_OTHER): Payer: Self-pay | Admitting: *Deleted

## 2018-01-02 ENCOUNTER — Inpatient Hospital Stay (HOSPITAL_BASED_OUTPATIENT_CLINIC_OR_DEPARTMENT_OTHER)
Admission: EM | Admit: 2018-01-02 | Discharge: 2018-01-06 | DRG: 871 | Disposition: A | Discharge status: Home Health | Payer: Medicare Other | Attending: Internal Medicine | Admitting: Internal Medicine

## 2018-01-02 ENCOUNTER — Emergency Department (HOSPITAL_BASED_OUTPATIENT_CLINIC_OR_DEPARTMENT_OTHER): Payer: Medicare Other

## 2018-01-02 ENCOUNTER — Other Ambulatory Visit: Payer: Self-pay | Admitting: Internal Medicine

## 2018-01-02 ENCOUNTER — Other Ambulatory Visit: Payer: Self-pay

## 2018-01-02 DIAGNOSIS — F4310 Post-traumatic stress disorder, unspecified: Secondary | ICD-10-CM | POA: Diagnosis present

## 2018-01-02 DIAGNOSIS — Z8249 Family history of ischemic heart disease and other diseases of the circulatory system: Secondary | ICD-10-CM

## 2018-01-02 DIAGNOSIS — Z6825 Body mass index (BMI) 25.0-25.9, adult: Secondary | ICD-10-CM

## 2018-01-02 DIAGNOSIS — I214 Non-ST elevation (NSTEMI) myocardial infarction: Secondary | ICD-10-CM

## 2018-01-02 DIAGNOSIS — I2510 Atherosclerotic heart disease of native coronary artery without angina pectoris: Secondary | ICD-10-CM | POA: Diagnosis present

## 2018-01-02 DIAGNOSIS — I959 Hypotension, unspecified: Secondary | ICD-10-CM | POA: Diagnosis present

## 2018-01-02 DIAGNOSIS — D649 Anemia, unspecified: Secondary | ICD-10-CM | POA: Diagnosis not present

## 2018-01-02 DIAGNOSIS — E1151 Type 2 diabetes mellitus with diabetic peripheral angiopathy without gangrene: Secondary | ICD-10-CM | POA: Diagnosis present

## 2018-01-02 DIAGNOSIS — I447 Left bundle-branch block, unspecified: Secondary | ICD-10-CM | POA: Diagnosis present

## 2018-01-02 DIAGNOSIS — Z79899 Other long term (current) drug therapy: Secondary | ICD-10-CM

## 2018-01-02 DIAGNOSIS — E872 Acidosis, unspecified: Secondary | ICD-10-CM | POA: Diagnosis present

## 2018-01-02 DIAGNOSIS — N179 Acute kidney failure, unspecified: Secondary | ICD-10-CM | POA: Diagnosis present

## 2018-01-02 DIAGNOSIS — J111 Influenza due to unidentified influenza virus with other respiratory manifestations: Secondary | ICD-10-CM | POA: Diagnosis present

## 2018-01-02 DIAGNOSIS — E785 Hyperlipidemia, unspecified: Secondary | ICD-10-CM | POA: Diagnosis present

## 2018-01-02 DIAGNOSIS — Z87891 Personal history of nicotine dependence: Secondary | ICD-10-CM | POA: Diagnosis present

## 2018-01-02 DIAGNOSIS — R069 Unspecified abnormalities of breathing: Secondary | ICD-10-CM

## 2018-01-02 DIAGNOSIS — J101 Influenza due to other identified influenza virus with other respiratory manifestations: Secondary | ICD-10-CM | POA: Diagnosis present

## 2018-01-02 DIAGNOSIS — I169 Hypertensive crisis, unspecified: Secondary | ICD-10-CM | POA: Diagnosis present

## 2018-01-02 DIAGNOSIS — Z885 Allergy status to narcotic agent status: Secondary | ICD-10-CM

## 2018-01-02 DIAGNOSIS — N529 Male erectile dysfunction, unspecified: Secondary | ICD-10-CM | POA: Diagnosis present

## 2018-01-02 DIAGNOSIS — R197 Diarrhea, unspecified: Secondary | ICD-10-CM | POA: Diagnosis present

## 2018-01-02 DIAGNOSIS — Z7984 Long term (current) use of oral hypoglycemic drugs: Secondary | ICD-10-CM

## 2018-01-02 DIAGNOSIS — Z951 Presence of aortocoronary bypass graft: Secondary | ICD-10-CM

## 2018-01-02 DIAGNOSIS — E876 Hypokalemia: Secondary | ICD-10-CM | POA: Diagnosis present

## 2018-01-02 DIAGNOSIS — A419 Sepsis, unspecified organism: Secondary | ICD-10-CM | POA: Diagnosis present

## 2018-01-02 DIAGNOSIS — G9341 Metabolic encephalopathy: Secondary | ICD-10-CM | POA: Diagnosis present

## 2018-01-02 DIAGNOSIS — Z86010 Personal history of colonic polyps: Secondary | ICD-10-CM | POA: Diagnosis not present

## 2018-01-02 DIAGNOSIS — F17210 Nicotine dependence, cigarettes, uncomplicated: Secondary | ICD-10-CM | POA: Diagnosis present

## 2018-01-02 DIAGNOSIS — Z716 Tobacco abuse counseling: Secondary | ICD-10-CM

## 2018-01-02 DIAGNOSIS — Z720 Tobacco use: Secondary | ICD-10-CM | POA: Diagnosis present

## 2018-01-02 DIAGNOSIS — E861 Hypovolemia: Secondary | ICD-10-CM | POA: Diagnosis present

## 2018-01-02 DIAGNOSIS — G4700 Insomnia, unspecified: Secondary | ICD-10-CM | POA: Diagnosis present

## 2018-01-02 DIAGNOSIS — R5381 Other malaise: Secondary | ICD-10-CM | POA: Diagnosis present

## 2018-01-02 DIAGNOSIS — R0989 Other specified symptoms and signs involving the circulatory and respiratory systems: Secondary | ICD-10-CM

## 2018-01-02 DIAGNOSIS — I5043 Acute on chronic combined systolic (congestive) and diastolic (congestive) heart failure: Secondary | ICD-10-CM | POA: Diagnosis present

## 2018-01-02 DIAGNOSIS — J9621 Acute and chronic respiratory failure with hypoxia: Secondary | ICD-10-CM | POA: Diagnosis present

## 2018-01-02 DIAGNOSIS — Z7982 Long term (current) use of aspirin: Secondary | ICD-10-CM

## 2018-01-02 DIAGNOSIS — N39498 Other specified urinary incontinence: Secondary | ICD-10-CM | POA: Diagnosis present

## 2018-01-02 DIAGNOSIS — N183 Chronic kidney disease, stage 3 (moderate): Secondary | ICD-10-CM | POA: Diagnosis present

## 2018-01-02 DIAGNOSIS — I5023 Acute on chronic systolic (congestive) heart failure: Secondary | ICD-10-CM

## 2018-01-02 DIAGNOSIS — Z7902 Long term (current) use of antithrombotics/antiplatelets: Secondary | ICD-10-CM

## 2018-01-02 DIAGNOSIS — E860 Dehydration: Secondary | ICD-10-CM | POA: Diagnosis present

## 2018-01-02 DIAGNOSIS — K219 Gastro-esophageal reflux disease without esophagitis: Secondary | ICD-10-CM | POA: Diagnosis present

## 2018-01-02 DIAGNOSIS — I255 Ischemic cardiomyopathy: Secondary | ICD-10-CM | POA: Diagnosis present

## 2018-01-02 DIAGNOSIS — Z955 Presence of coronary angioplasty implant and graft: Secondary | ICD-10-CM

## 2018-01-02 DIAGNOSIS — I130 Hypertensive heart and chronic kidney disease with heart failure and stage 1 through stage 4 chronic kidney disease, or unspecified chronic kidney disease: Secondary | ICD-10-CM | POA: Diagnosis present

## 2018-01-02 DIAGNOSIS — R627 Adult failure to thrive: Secondary | ICD-10-CM | POA: Diagnosis present

## 2018-01-02 DIAGNOSIS — I340 Nonrheumatic mitral (valve) insufficiency: Secondary | ICD-10-CM | POA: Diagnosis not present

## 2018-01-02 DIAGNOSIS — H9190 Unspecified hearing loss, unspecified ear: Secondary | ICD-10-CM | POA: Diagnosis present

## 2018-01-02 DIAGNOSIS — E1122 Type 2 diabetes mellitus with diabetic chronic kidney disease: Secondary | ICD-10-CM | POA: Diagnosis present

## 2018-01-02 DIAGNOSIS — Z77098 Contact with and (suspected) exposure to other hazardous, chiefly nonmedicinal, chemicals: Secondary | ICD-10-CM | POA: Diagnosis present

## 2018-01-02 DIAGNOSIS — I493 Ventricular premature depolarization: Secondary | ICD-10-CM | POA: Diagnosis present

## 2018-01-02 HISTORY — DX: Chronic systolic (congestive) heart failure: I50.22

## 2018-01-02 HISTORY — DX: Left bundle-branch block, unspecified: I44.7

## 2018-01-02 HISTORY — DX: Insomnia, unspecified: G47.00

## 2018-01-02 HISTORY — DX: Essential (primary) hypertension: I10

## 2018-01-02 HISTORY — DX: Disorder of arteries and arterioles, unspecified: I77.9

## 2018-01-02 HISTORY — DX: Type 2 diabetes mellitus with other circulatory complications: E11.59

## 2018-01-02 HISTORY — DX: Anxiety disorder, unspecified: F41.9

## 2018-01-02 HISTORY — DX: Hyperlipidemia, unspecified: E78.5

## 2018-01-02 HISTORY — DX: Contact with and (suspected) exposure to other war theater: Z77.39

## 2018-01-02 HISTORY — DX: Peripheral vascular disease, unspecified: I73.9

## 2018-01-02 HISTORY — DX: Contact with and (suspected) exposure to other hazardous, chiefly nonmedicinal, chemicals: Z77.098

## 2018-01-02 LAB — COMPREHENSIVE METABOLIC PANEL
ALK PHOS: 60 U/L (ref 38–126)
ALT: 20 U/L (ref 17–63)
AST: 89 U/L — AB (ref 15–41)
Albumin: 3.8 g/dL (ref 3.5–5.0)
Anion gap: 12 (ref 5–15)
BILIRUBIN TOTAL: 1.3 mg/dL — AB (ref 0.3–1.2)
BUN: 25 mg/dL — AB (ref 6–20)
CALCIUM: 9.3 mg/dL (ref 8.9–10.3)
CO2: 23 mmol/L (ref 22–32)
Chloride: 100 mmol/L — ABNORMAL LOW (ref 101–111)
Creatinine, Ser: 1.8 mg/dL — ABNORMAL HIGH (ref 0.61–1.24)
GFR calc Af Amer: 41 mL/min — ABNORMAL LOW (ref >60)
GFR calc non Af Amer: 36 mL/min — ABNORMAL LOW (ref >60)
Glucose, Bld: 118 mg/dL — ABNORMAL HIGH (ref 65–99)
POTASSIUM: 2.9 mmol/L — AB (ref 3.5–5.1)
SODIUM: 135 mmol/L (ref 135–145)
TOTAL PROTEIN: 7.4 g/dL (ref 6.5–8.1)

## 2018-01-02 LAB — CBC WITH DIFFERENTIAL/PLATELET
Basophils Absolute: 0 10*3/uL (ref 0.0–0.1)
Basophils Relative: 0 %
EOS ABS: 0.1 10*3/uL (ref 0.0–0.7)
Eosinophils Relative: 1 %
HCT: 39.9 % (ref 39.0–52.0)
HEMOGLOBIN: 13.9 g/dL (ref 13.0–17.0)
LYMPHS ABS: 2 10*3/uL (ref 0.7–4.0)
LYMPHS PCT: 16 %
MCH: 30.3 pg (ref 26.0–34.0)
MCHC: 34.8 g/dL (ref 30.0–36.0)
MCV: 86.9 fL (ref 78.0–100.0)
MONOS PCT: 8 %
Monocytes Absolute: 1 10*3/uL (ref 0.1–1.0)
NEUTROS PCT: 75 %
Neutro Abs: 9.6 10*3/uL — ABNORMAL HIGH (ref 1.7–7.7)
Platelets: 194 10*3/uL (ref 150–400)
RBC: 4.59 MIL/uL (ref 4.22–5.81)
RDW: 14.3 % (ref 11.5–15.5)
WBC: 12.7 10*3/uL — AB (ref 4.0–10.5)

## 2018-01-02 LAB — URINALYSIS, MICROSCOPIC (REFLEX)

## 2018-01-02 LAB — I-STAT CG4 LACTIC ACID, ED
Lactic Acid, Venous: 2.81 mmol/L (ref 0.5–1.9)
Lactic Acid, Venous: 2.03 mmol/L (ref 0.5–1.9)

## 2018-01-02 LAB — URINALYSIS, ROUTINE W REFLEX MICROSCOPIC
Bilirubin Urine: NEGATIVE
GLUCOSE, UA: NEGATIVE mg/dL
Ketones, ur: NEGATIVE mg/dL
LEUKOCYTES UA: NEGATIVE
NITRITE: NEGATIVE
Protein, ur: 100 mg/dL — AB
SPECIFIC GRAVITY, URINE: 1.015 (ref 1.005–1.030)
pH: 6 (ref 5.0–8.0)

## 2018-01-02 LAB — PROTIME-INR
INR: 1.23
PROTHROMBIN TIME: 15.4 s — AB (ref 11.4–15.2)

## 2018-01-02 MED ORDER — POTASSIUM CHLORIDE CRYS ER 20 MEQ PO TBCR
40.0000 meq | EXTENDED_RELEASE_TABLET | Freq: Once | ORAL | Status: AC
  Administered 2018-01-02: 40 meq via ORAL
  Filled 2018-01-02: qty 2

## 2018-01-02 MED ORDER — SODIUM CHLORIDE 0.9 % IV SOLN
INTRAVENOUS | Status: DC
  Administered 2018-01-03 (×2): via INTRAVENOUS

## 2018-01-02 MED ORDER — ONDANSETRON HCL 4 MG/2ML IJ SOLN: 4.0000 mg | Freq: Four times a day (QID) | INTRAMUSCULAR | Status: DC | PRN

## 2018-01-02 MED ORDER — SODIUM CHLORIDE 0.9 % IV SOLN
Freq: Once | INTRAVENOUS | Status: AC
  Administered 2018-01-02: 20:00:00 via INTRAVENOUS

## 2018-01-02 MED ORDER — ONDANSETRON HCL 4 MG PO TABS: 4.0000 mg | ORAL_TABLET | Freq: Four times a day (QID) | ORAL | Status: DC | PRN

## 2018-01-02 MED ORDER — SODIUM CHLORIDE 0.9 % IV BOLUS (SEPSIS)
1000.0000 mL | Freq: Once | INTRAVENOUS | Status: AC
  Administered 2018-01-02: 1000 mL via INTRAVENOUS

## 2018-01-02 MED ORDER — ENOXAPARIN SODIUM 40 MG/0.4ML ~~LOC~~ SOLN
40.0000 mg | Freq: Every day | SUBCUTANEOUS | Status: DC
  Administered 2018-01-03: 40 mg via SUBCUTANEOUS
  Filled 2018-01-02: qty 0.4

## 2018-01-02 MED ORDER — INSULIN ASPART 100 UNIT/ML ~~LOC~~ SOLN
0.0000 [IU] | Freq: Three times a day (TID) | SUBCUTANEOUS | Status: DC
  Administered 2018-01-03: 2 [IU] via SUBCUTANEOUS
  Administered 2018-01-04 – 2018-01-05 (×4): 1 [IU] via SUBCUTANEOUS
  Administered 2018-01-06: 06:00:00 2 [IU] via SUBCUTANEOUS

## 2018-01-02 NOTE — ED Triage Notes (Signed)
Pt states dx flu x 2 days ago pt c/o today fever

## 2018-01-02 NOTE — H&P (Signed)
History and Physical    Jesse Hansen WUJ:[id number] DOB: Mar 29, 1946 DOA: 01/02/2018  Referring MD/NP/PA:  Dr. Harrell Gave Tegeler PCP: Biagio Borg, MD  Patient coming from:[facility] transfer  Chief Complaint: Fever I have personally briefly reviewed patient's old medical records in [place]   HPI: Jesse Hansen is a 72 y.o. male with medical history significant of HTN, HLD, CAD s/p cabg, DM type 2, PTSD, tobacco abuse, tobacco abuse and recent diagnosed with flu 2-3 days ago on Tamiflu; who initially presented with complaints of persistent fevers.  Patient states since being diagnosed with the flu that he has been taking Tamiflu as advised, but has had persistent fevers.  He normally receives his care at the [facility] and his wife have been contacting them, but had not heard anything back.  Over this time he reports becoming more weak to the point where his wife had to help him get around his home which is not normal for him.  He reported having a "blowout" this morning where he had runny liquid bowel movement and reports having 2 episodes of urinary incontinence due to inability of getting to the bathroom in a quick enough fashion.  Denies having any significant chest pain, falls, nausea, vomiting, change in speech, or focal weakness.   ED Course: Initial vital signs revealed blood pressures as low as 76/59. Labs revealed WBC 12.7, potassium 2.9, BUN 25, creatinine 1.8(baseline 1).  Patient given 3 L of normal saline IV fluids with improvement of systolic blood pressures to 100s.  Present being given 1 L of normal saline fluids and 40 mEq of potassium chloride IV in the ED. [facility] called to admit and accepted to a telemetry bed as inpatient.  Upon arrival to the [facility] patient reported feeling significantly better.    Review of Systems  Constitutional: Positive for fever and malaise/fatigue.  HENT: Negative for ear discharge and nosebleeds.   Eyes: Negative for pain and  discharge.  Respiratory: Negative for hemoptysis and shortness of breath.   Cardiovascular: Negative for chest pain and leg swelling.  Gastrointestinal: Positive for diarrhea. Negative for nausea and vomiting.  Genitourinary: Negative for dysuria and hematuria.       Positive for urinary incontinence  Musculoskeletal: Positive for myalgias. Negative for falls.  Skin: Negative for itching and rash.  Neurological: Positive for weakness. Negative for focal weakness and loss of consciousness.  Psychiatric/Behavioral: Negative for hallucinations and substance abuse.    Past Medical History:  Diagnosis Date  . ANXIETY 08/23/2009   Qualifier: Diagnosis of  By: Jenny Reichmann MD, [place], HX OF 01/29/2011   Qualifier: Diagnosis of  By: Jenny Reichmann MD, [place] Oris   . Coronary artery disease involving native coronary artery with angina pectoris ([place]) 02/23/2008   s/p Multiple PCIs; CABG x 3 in 2013 (SVG-OM1, SVG-Diag with freeLIMA-LAD from SVG-Diag hood) --> relook cath in ~2015 with at least 1 graft ~occluded by report  . DIABETES MELLITUS, TYPE II 01/29/2011   Qualifier: Diagnosis of  By: Jenny Reichmann MD, [place] ERECTILE DYSFUNCTION 08/15/2007   Qualifier: Diagnosis of  By: Dance CMA ([place]), [place]    . HYPERLIPIDEMIA 02/23/2008   Qualifier: Diagnosis of  By: Jenny Reichmann MD, [place] Oris   . HYPERTENSION 08/15/2007   Qualifier: Diagnosis of  By: Dance CMA ([place]), Kim    . INSOMNIA-SLEEP DISORDER-UNSPEC 02/23/2008   Qualifier: Diagnosis of  By: Jenny Reichmann MD, [place] Oris   . PERIPHERAL VASCULAR  DISEASE 02/23/2008   Qualifier: Diagnosis of  By: Jenny Reichmann MD, [place] Oris   . PTSD 07/30/2010   Qualifier: Diagnosis of  By: Jenny Reichmann MD, [place] Oris     Past Surgical History:  Procedure Laterality Date  . ABDOMINAL AORTIC ANEURYSM REPAIR  1996?   Done at [facility] by Dr. Donnetta Hutching  . CAROTID ENDARTERECTOMY Left    Dr. Volney American at [facility] in [place]   . CAROTID PTA/STENT INTERVENTION Left 03/24/2017   Procedure: Carotid PTA/Stent Intervention;  Surgeon: Serafina Mitchell, MD;  Location: [place];  Service: Cardiovascular;  Laterality: Left;  . CORONARY ARTERY BYPASS GRAFT  2013   @ [place], [place] ([place] was contaminated) -- SVG-Diag with freeLIMA-LAD sewn to SVG hood, SVG-OM1  . CORONARY STENT PLACEMENT  2007  . PROSTATE BIOPSY  2009     reports that he has been smoking cigarettes.  he has never used smokeless tobacco. He reports that he drinks alcohol. He reports that he does not use drugs.  Allergies  Allergen Reactions  . Codeine Nausea And Vomiting    Family History  Problem Relation Age of Onset  . Hypertension Father     Prior to Admission medications   Medication Sig Start Date End Date Taking? Authorizing Provider  acetaminophen (TYLENOL) 325 MG tablet Take 650 mg by mouth every 6 (six) hours as needed.   Yes [provider]  albuterol (PROVENTIL) (2.5 MG/3ML) 0.083% nebulizer solution Take 2.5 mg by nebulization every 6 (six) hours as needed for wheezing or shortness of breath.   Yes [provider]  aspirin EC 81 MG tablet Take 1 tablet (81 mg total) by mouth daily. Patient taking differently: Take 81 mg by mouth at bedtime.  05/15/14  Yes Biagio Borg, MD  atorvastatin (LIPITOR) 80 MG tablet Take 40 mg by mouth every evening.    Yes [provider]  benzonatate (TESSALON) 100 MG capsule Take by mouth 3 (three) times daily as needed for cough.   Yes [provider]  cholecalciferol (VITAMIN D) 1000 units tablet Take 1,000 Units by mouth at bedtime.   Yes [provider]  clopidogrel (PLAVIX) 75 MG tablet Take 75 mg by mouth daily.   Yes [provider]  furosemide (LASIX) 20 MG tablet Take 20 mg by mouth daily.   Yes [provider]  glipiZIDE (GLUCOTROL) 5 MG tablet Take 5 mg by mouth 2 (two) times daily before a meal. Take 30 minutes prior to meals.   Yes [provider]  isosorbide dinitrate (ISORDIL) 20 MG tablet  Take 40 mg by mouth 3 (three) times daily.    Yes [provider]  lisinopril (PRINIVIL,ZESTRIL) 5 MG tablet Take 2.5 mg by mouth daily.   Yes [provider]  LORazepam (ATIVAN) 1 MG tablet TAKE 2 TABLETS BY MOUTH EVERY NIGHT AT BEDTIME AS NEEDED 01/02/18  Yes Biagio Borg, MD  metFORMIN (GLUCOPHAGE) 500 MG tablet Take 1 tablet (500 mg total) by mouth 2 (two) times daily with a meal. 03/26/17  Yes Virgina Jock A, PA-C  metoprolol tartrate (LOPRESSOR) 25 MG tablet Take 12.5 mg by mouth 2 (two) times daily.   Yes [provider]  nitroGLYCERIN (NITROSTAT) 0.4 MG SL tablet Place 0.4 mg under the tongue every 5 (five) minutes as needed for chest pain.   Yes [provider]  oseltamivir (TAMIFLU) 75 MG capsule Take 75 mg by mouth.   Yes [provider]  ranitidine (ZANTAC) 150 MG tablet Take 150 mg by mouth 2 (two) times daily as needed for heartburn.    [provider]    Physical Exam:  Constitutional: Elderly male who appears sick, but nontoxic and able to follow commands Vitals:   01/02/18 1830 01/02/18 2031 01/02/18 2108 01/02/18 2230  BP: 107/64 (!) 108/57 (!) 108/57 (!) 103/54  Pulse: 65 74 66 68  Resp: 16 16 (!) 9 20  Temp:    97.8 F (36.6 C)  TempSrc:    Oral  SpO2: 97% 98% 97% 95%  Weight:      Height:       Eyes: PERRL, lids and conjunctivae normal ENMT: Mucous membranes are moist. Posterior pharynx clear of any exudate or lesions.      Neck: normal, supple, no masses, no thyromegaly Respiratory: clear to auscultation bilaterally, no wheezing, no crackles. Normal respiratory effort. No accessory muscle use.  Cardiovascular: Regular rate and rhythm, no murmurs / rubs / gallops. No extremity edema. 2+ pedal pulses. No carotid bruits.  Abdomen: no tenderness, no masses palpated. No hepatosplenomegaly. Bowel sounds positive.  Musculoskeletal: no clubbing / cyanosis. No joint deformity upper and lower extremities. Good ROM, no  contractures. Normal muscle tone.  Skin: no rashes, lesions, ulcers. No induration Neurologic: CN 2-12 grossly intact. Sensation intact, DTR normal. Strength 5/5 in all 4.  Psychiatric: Normal judgment and insight. Alert and oriented x 3. Normal mood.     Labs on Admission: I have personally reviewed following labs and imaging studies  CBC: Recent Labs  Lab 01/02/18 1550  WBC 12.7*  NEUTROABS 9.6*  HGB 13.9  HCT 39.9  MCV 86.9  PLT 630   Basic Metabolic Panel: Recent Labs  Lab 01/02/18 1550  NA 135  K 2.9*  CL 100*  CO2 23  GLUCOSE 118*  BUN 25*  CREATININE 1.80*  CALCIUM 9.3   GFR: Estimated Creatinine Clearance: 40.1 mL/min (A) (by C-G formula based on SCr of 1.8 mg/dL (H)). Liver Function Tests: Recent Labs  Lab 01/02/18 1550  AST 89*  ALT 20  ALKPHOS 60  BILITOT 1.3*  PROT 7.4  ALBUMIN 3.8   No results for input(s): LIPASE, AMYLASE in the last 168 hours. No results for input(s): AMMONIA in the last 168 hours. Coagulation Profile: Recent Labs  Lab 01/02/18 1550  INR 1.23   Cardiac Enzymes: No results for input(s): CKTOTAL, CKMB, CKMBINDEX, TROPONINI in the last 168 hours. BNP (last 3 results) No results for input(s): PROBNP in the last 8760 hours. HbA1C: No results for input(s): HGBA1C in the last 72 hours. CBG: No results for input(s): GLUCAP in the last 168 hours. Lipid Profile: No results for input(s): CHOL, HDL, LDLCALC, TRIG, CHOLHDL, LDLDIRECT in the last 72 hours. Thyroid Function Tests: No results for input(s): TSH, T4TOTAL, FREET4, T3FREE, THYROIDAB in the last 72 hours. Anemia Panel: No results for input(s): VITAMINB12, FOLATE, FERRITIN, TIBC, IRON, RETICCTPCT in the last 72 hours. Urine analysis:    Component Value Date/Time   COLORURINE YELLOW 01/02/2018 1945   APPEARANCEUR CLOUDY (A) 01/02/2018 1945   LABSPEC 1.015 01/02/2018 1945   PHURINE 6.0 01/02/2018 1945   GLUCOSEU NEGATIVE 01/02/2018 1945   HGBUR LARGE (A) 01/02/2018  [place] NEGATIVE 01/02/2018 [place] NEGATIVE 01/02/2018 1945   PROTEINUR 100 (A) 01/02/2018 1945   NITRITE NEGATIVE 01/02/2018 1945   LEUKOCYTESUR NEGATIVE 01/02/2018 1945   Sepsis Labs: No results found for this or any  previous visit (from the past 240 hour(s)).   Radiological Exams on Admission: Dg Chest Port 1 View  Result Date: 01/02/2018 CLINICAL DATA:  Fever, diagnosed with the flu 2 days ago, history coronary artery disease, hypertension, diabetes mellitus, smoker EXAM: PORTABLE CHEST 1 VIEW COMPARISON:  Portable exam 1558 hours compared to 01/25/2016 FINDINGS: Normal heart size post CABG. Atherosclerotic calcification aorta. Mediastinal contours and pulmonary vascularity normal. Minimal chronic interstitial prominence stable. No acute infiltrate, pleural effusion or pneumothorax. Bones unremarkable. IMPRESSION: Post CABG. Minimal chronic interstitial prominence without acute infiltrate. Electronically Signed   By: Lavonia Dana M.D.   On: 01/02/2018 16:16    EKG: Independently reviewed.  Sinus rhythm with LBBB  Assessment/Plan Acute kidney injury:   Patient presents with a creatinine of 1.8 with a BUN of 25.  Baseline creatinine previously noted to be around 1.  Recent symptoms including fever and loose stools give concern for prerenal cause of symptoms. - Admit to a telemetry bed - Strict intake and output - Check renal ultrasound - Recheck BMP in a.m.  Recent diagnosis with influenza: Acute.  Chest x-ray showing no clear signs of infiltrate.   - Continue Tamiflu with renal adjustment    Hypotension, history of essential hypertension: Acute.  Initial blood pressures noted to be as low as 76/59 on admission.  Patient given liter of IV fluids with improvement of blood pressures. Patient with history of heart failure in the past. - Hold lisinopril, furosemide, metoprolol, and isosorbide mononitrate. Restart when medically appropriate - Continue IV fluids as  tolerated   Lactic acidosis: Acute.  Initial lactic acid 2.81 but seem to be trending down with IV fluids.  No other source of infection noted at this time. Diarrhea noted, but could be secondary to medication.  Monitoring patient off antibiotics for now. - Continue to trend lactic acid level - Continue to monitor diarrhea, and consider need of further workup if needed   Hypokalemia: Acute.  Initial potassium noted to be 2.9 on admission  Patient given 40 mEq of potassium chloride in the ED. - Check magnesium in a.m. - Continue to monitor and replace as needed  History of CHF: Last EF noted to be around 40% with hypokinesis in 01/2016. - Strict I&O's and daily weights  CAD s/p CABG, history of peripheral vascular disease - Continue aspirin and Plavix  Diabetes mellitus type 2: Patient on oral medications of metformin and glipizide.  Last hemoglobin A1c on file noted to be 6.8 on 01/26/2016. - Hypoglycemic protocols - Hold metformin and glipizide - CBGs q. before meals with sensitive SSI  GERD - Pharmacy substitution of Pepcid for ranitidine   Tobacco abuse - Counseled the patient on need of cessation of tobacco - Nicotine patch  DVT prophylaxis: lovenox   Code Status: full  Family Communication: No family present at bedside Disposition Plan: Likely discharge home in 2-3 days Consults called: none  Admission status: Inpatient  Norval Morton MD [facility] Pager [phone]   If 7PM-7AM, please contact night-coverage [email or web address] Password [facility]  01/02/2018, 11:05 PM

## 2018-01-02 NOTE — Telephone Encounter (Signed)
Done erx 

## 2018-01-02 NOTE — ED Provider Notes (Signed)
[facility] EMERGENCY DEPARTMENT Provider Note   CSN: [id number] Arrival date & time: 01/02/18  1532     History   Chief Complaint Chief Complaint  Patient presents with  . Influenza    HPI Jesse Hansen is a 72 y.o. male.  The history is provided by the patient and the spouse.  Altered Mental Status   This is a new problem. The current episode started more than 2 days ago. The problem has been gradually worsening. Associated symptoms include confusion. Pertinent negatives include no somnolence, no unresponsiveness and no weakness. Risk factors include a recent infection (flu). His past medical history is significant for diabetes and heart disease. His past medical history does not include CVA, TIA, AIDS, hypertension, COPD or dementia.    Past Medical History:  Diagnosis Date  . ANXIETY 08/23/2009   Qualifier: Diagnosis of  By: Jenny Reichmann MD, [place], HX OF 01/29/2011   Qualifier: Diagnosis of  By: Jenny Reichmann MD, [place] Oris   . Coronary artery disease involving native coronary artery with angina pectoris ([place]) 02/23/2008   s/p Multiple PCIs; CABG x 3 in 2013 (SVG-OM1, SVG-Diag with freeLIMA-LAD from SVG-Diag hood) --> relook cath in ~2015 with at least 1 graft ~occluded by report  . DIABETES MELLITUS, TYPE II 01/29/2011   Qualifier: Diagnosis of  By: Jenny Reichmann MD, [place] ERECTILE DYSFUNCTION 08/15/2007   Qualifier: Diagnosis of  By: Dance CMA ([place]), [place]    . HYPERLIPIDEMIA 02/23/2008   Qualifier: Diagnosis of  By: Jenny Reichmann MD, [place] Or[place]   . HYPERTENSION 08/15/2007   Qualifier: Diagnosis of  By: Dance CMA ([place]), Kim    . INSOMNIA-SLEEP DISORDER-UNSPEC 02/23/2008   Qualifier: Diagnosis of  By: Jenny Reichmann MD, [place] Oris   . PERIPHERAL VASCULAR DISEASE 02/23/2008   Qualifier: Diagnosis of  By: Jenny Reichmann MD, [place] Oris   . PTSD 07/30/2010   Qualifier: Diagnosis of  By: Jenny Reichmann MD, [place] Oris     Patient Active Problem List   Diagnosis Date Noted  . Carotid stenosis 03/24/2017  . Cardiomyopathy,  ischemic 01/27/2016  . Aortic stenosis, mild 01/27/2016  . CAP (community acquired pneumonia) 01/26/2016  . COPD exacerbation ([place]) 01/26/2016  . Acute congestive heart failure ([place]) 01/26/2016  . Hx of CABG x 3 2013 01/26/2016  . Agent orange exposure 01/26/2016  . LBBB (left bundle branch block) 01/26/2016  . Chronic stable angina ([place]) 01/26/2016  . Elevated troponin 01/25/2016  . Insomnia 03/02/2013  . Preventative health care 02/28/2012  . Diabetes ([place]) 01/29/2011  . COLONIC POLYPS, HX OF 01/29/2011  . PTSD 07/30/2010  . Anxiety state 08/23/2009  . Hyperlipidemia 02/23/2008  . PERIPHERAL VASCULAR DISEASE 02/23/2008  . INSOMNIA-SLEEP DISORDER-UNSPEC 02/23/2008  . Coronary artery disease involving native coronary artery with angina pectoris ([place]) 02/23/2008  . ERECTILE DYSFUNCTION 08/15/2007  . Essential hypertension 08/15/2007    Past Surgical History:  Procedure Laterality Date  . ABDOMINAL AORTIC ANEURYSM REPAIR  1996?   Done at [facility] by Dr. Donnetta Hutching  . CAROTID ENDARTERECTOMY Left    Dr. Volney American at [facility] in [place]   . CAROTID PTA/STENT INTERVENTION Left 03/24/2017   Procedure: Carotid PTA/Stent Intervention;  Surgeon: Serafina Mitchell, MD;  Location: [facility];  Service: Cardiovascular;  Laterality: Left;  . CORONARY ARTERY BYPASS GRAFT  2013   @ [place], [place] ([place] was contaminated) -- SVG-Diag with freeLIMA-LAD sewn to SVG hood, SVG-OM1  .  CORONARY STENT PLACEMENT  2007  . PROSTATE BIOPSY  2009       Home Medications    Prior to Admission medications   Medication Sig Start Date End Date Taking? Authorizing Provider  acetaminophen (TYLENOL) 325 MG tablet Take 650 mg by mouth every 6 (six) hours as needed.   Yes [provider]  albuterol (PROVENTIL) (2.5 MG/3ML) 0.083% nebulizer solution Take 2.5 mg by nebulization every 6 (six) hours as needed for wheezing or shortness of breath.   Yes [provider]    benzonatate (TESSALON) 100 MG capsule Take by mouth 3 (three) times daily as needed for cough.   Yes [provider]  oseltamivir (TAMIFLU) 75 MG capsule Take 75 mg by mouth.   Yes [provider]  aspirin EC 81 MG tablet Take 1 tablet (81 mg total) by mouth daily. Patient taking differently: Take 81 mg by mouth at bedtime.  05/15/14   Biagio Borg, MD  atorvastatin (LIPITOR) 80 MG tablet Take 40 mg by mouth every evening.     [provider]  cholecalciferol (VITAMIN D) 1000 units tablet Take 1,000 Units by mouth at bedtime.    [provider]  clopidogrel (PLAVIX) 75 MG tablet Take 75 mg by mouth daily.    [provider]  furosemide (LASIX) 20 MG tablet Take 20 mg by mouth daily.    [provider]  glipiZIDE (GLUCOTROL) 5 MG tablet Take 5 mg by mouth 2 (two) times daily before a meal. Take 30 minutes prior to meals.    [provider]  isosorbide dinitrate (ISORDIL) 20 MG tablet Take 40 mg by mouth 3 (three) times daily.     [provider]  lisinopril (PRINIVIL,ZESTRIL) 5 MG tablet Take 2.5 mg by mouth daily.    [provider]  LORazepam (ATIVAN) 1 MG tablet TAKE 2 TABLETS BY MOUTH EVERY NIGHT AT BEDTIME AS NEEDED 01/02/18   Biagio Borg, MD  metFORMIN (GLUCOPHAGE) 500 MG tablet Take 1 tablet (500 mg total) by mouth 2 (two) times daily with a meal. 03/26/17   Virgina Jock A, PA-C  metoprolol tartrate (LOPRESSOR) 25 MG tablet Take 12.5 mg by mouth 2 (two) times daily.    [provider]  nitroGLYCERIN (NITROSTAT) 0.4 MG SL tablet Place 0.4 mg under the tongue every 5 (five) minutes as needed for chest pain.    [provider]  ranitidine (ZANTAC) 150 MG tablet Take 150 mg by mouth 2 (two) times daily as needed for heartburn.    [provider]    Family History Family History  Problem Relation Age of Onset  . Hypertension Father     Social History Social History   Tobacco Use   . Smoking status: Current Every Day Smoker    Types: Cigarettes  . Smokeless tobacco: Never Used  . Tobacco comment: 4-8 cigarettes per day  Substance Use Topics  . Alcohol use: Yes    Alcohol/week: 0.0 oz  . Drug use: No     Allergies   Codeine   Review of Systems Review of Systems  Constitutional: Positive for chills, fatigue and fever. Negative for diaphoresis.  HENT: Positive for congestion.   Eyes: Negative for visual disturbance.  Respiratory: Positive for cough. Negative for chest tightness, shortness of breath, wheezing and stridor.   Cardiovascular: Negative for chest pain, palpitations and leg swelling.  Gastrointestinal: Negative for abdominal pain, constipation, diarrhea, nausea and vomiting.  Genitourinary: Negative for dysuria.  Musculoskeletal:  Negative for back pain, neck pain and neck stiffness.  Skin: Negative for rash and wound.  Neurological: Negative for dizziness, weakness, light-headedness and headaches.  Psychiatric/Behavioral: Positive for confusion.  All other systems reviewed and are negative.    Physical Exam Updated Vital Signs BP (!) 76/59   Pulse 70   Temp 98.5 F (36.9 C) (Oral)   Resp (!) 22   Ht 6' (1.829 m)   Wt 84.4 kg (186 lb)   SpO2 96%   BMI 25.23 kg/m   Physical Exam  Constitutional: He is oriented to person, place, and time. He appears well-developed and well-nourished. No distress.  HENT:  Head: Normocephalic and atraumatic.  Nose: Nose normal.  Mouth/Throat: Oropharynx is clear and moist. No oropharyngeal exudate.  Eyes: Conjunctivae and EOM are normal. Pupils are equal, round, and reactive to light.  Neck: Normal range of motion. Neck supple.  Cardiovascular: Normal rate and intact distal pulses.  No murmur heard. Pulmonary/Chest: Effort normal and breath sounds normal. No respiratory distress. He has no wheezes. He has no rales. He exhibits no tenderness.  Abdominal: He exhibits no distension. There is no  tenderness. There is no guarding. No hernia.  Musculoskeletal: He exhibits no edema or tenderness.  Neurological: He is alert and oriented to person, place, and time. No cranial nerve deficit or sensory deficit. He exhibits normal muscle tone. Coordination normal.  Skin: Capillary refill takes less than 2 seconds. He is not diaphoretic. No erythema. No pallor.  Psychiatric: He has a normal mood and affect.  Nursing note and vitals reviewed.    ED Treatments / Results  Labs (all labs ordered are listed, but only abnormal results are displayed) Labs Reviewed  COMPREHENSIVE METABOLIC PANEL - Abnormal; Notable for the following components:      Result Value   Potassium 2.9 (*)    Chloride 100 (*)    Glucose, Bld 118 (*)    BUN 25 (*)    Creatinine, Ser 1.80 (*)    AST 89 (*)    Total Bilirubin 1.3 (*)    GFR calc non Af Amer 36 (*)    GFR calc Af Amer 41 (*)    All other components within normal limits  CBC WITH DIFFERENTIAL/PLATELET - Abnormal; Notable for the following components:   WBC 12.7 (*)    Neutro Abs 9.6 (*)    All other components within normal limits  PROTIME-INR - Abnormal; Notable for the following components:   Prothrombin Time 15.4 (*)    All other components within normal limits  URINALYSIS, ROUTINE W REFLEX MICROSCOPIC - Abnormal; Notable for the following components:   APPearance CLOUDY (*)    Hgb urine dipstick LARGE (*)    Protein, ur 100 (*)    All other components within normal limits  URINALYSIS, MICROSCOPIC (REFLEX) - Abnormal; Notable for the following components:   Bacteria, UA FEW (*)    Squamous Epithelial / LPF 0-5 (*)    All other components within normal limits  I-STAT CG4 LACTIC ACID, ED - Abnormal; Notable for the following components:   Lactic Acid, Venous 2.81 (*)    All other components within normal limits  I-STAT CG4 LACTIC ACID, ED - Abnormal; Notable for the following components:   Lactic Acid, Venous 2.03 (*)    All other components  within normal limits  CULTURE, BLOOD (ROUTINE X 2)  CULTURE, BLOOD (ROUTINE X 2)  LACTIC ACID, PLASMA  CBC  BASIC METABOLIC PANEL  LACTIC ACID, PLASMA  MAGNESIUM    EKG  EKG Interpretation  Date/Time:  Monday January 02 2018 17:07:16 EST Ventricular Rate:  63 PR Interval:    QRS Duration: 151 QT Interval:  460 QTC Calculation: 471 R Axis:   -36 Text Interpretation:  Sinus rhythm Left bundle branch block When compared to prior, no significant changes seen.  No STEMI Confirmed by Antony Blackbird [phone]) on 01/02/2018 5:15:42 PM       Radiology Dg Chest Port 1 View  Result Date: 01/02/2018 CLINICAL DATA:  Fever, diagnosed with the flu 2 days ago, history coronary artery disease, hypertension, diabetes mellitus, smoker EXAM: PORTABLE CHEST 1 VIEW COMPARISON:  Portable exam 1558 hours compared to 01/25/2016 FINDINGS: Normal heart size post CABG. Atherosclerotic calcification aorta. Mediastinal contours and pulmonary vascularity normal. Minimal chronic interstitial prominence stable. No acute infiltrate, pleural effusion or pneumothorax. Bones unremarkable. IMPRESSION: Post CABG. Minimal chronic interstitial prominence without acute infiltrate. Electronically Signed   By: Lavonia Dana M.D.   On: 01/02/2018 16:16    Procedures Procedures (including critical care time)  CRITICAL CARE Performed by: Gwenyth Allegra Roshell Brigham Total critical care time: 35 minutes Critical care time was exclusive of separately billable procedures and treating other patients. Hypotension in the setting of influenza infection with acute kidney injury requiring several liters of normal saline fluid and admission. Critical care was necessary to treat or prevent imminent or life-threatening deterioration. Critical care was time spent personally by me on the following activities: development of treatment plan with patient and/or surrogate as well as nursing, discussions with consultants, evaluation of patient's  response to treatment, examination of patient, obtaining history from patient or surrogate, ordering and performing treatments and interventions, ordering and review of laboratory studies, ordering and review of radiographic studies, pulse oximetry and re-evaluation of patient's condition.   Medications Ordered in ED Medications  enoxaparin (LOVENOX) injection 40 mg (40 mg Subcutaneous Given 01/03/18 0058)  ondansetron (ZOFRAN) tablet 4 mg (not administered)    Or  ondansetron (ZOFRAN) injection 4 mg (not administered)  oseltamivir (TAMIFLU) capsule 30 mg (30 mg Oral Given 01/03/18 0058)  famotidine (PEPCID) tablet 20 mg (20 mg Oral Given 01/03/18 0058)  albuterol (PROVENTIL) (2.5 MG/3ML) 0.083% nebulizer solution 2.5 mg (not administered)  acetaminophen (TYLENOL) tablet 650 mg (not administered)  atorvastatin (LIPITOR) tablet 40 mg (not administered)  aspirin EC tablet 81 mg (81 mg Oral Given 01/03/18 0058)  insulin aspart (novoLOG) injection 0-9 Units (not administered)  0.9 %  sodium chloride infusion ( Intravenous New Bag/Given 01/03/18 0057)  LORazepam (ATIVAN) tablet 1 mg (not administered)  clopidogrel (PLAVIX) tablet 75 mg (not administered)  benzonatate (TESSALON) capsule 100 mg (not administered)  nicotine (NICODERM CQ - dosed in mg/24 hours) patch 14 mg (14 mg Transdermal Patch Applied 01/03/18 0058)  sodium chloride 0.9 % bolus 1,000 mL (0 mLs Intravenous Stopped 01/02/18 1805)  potassium chloride SA (K-DUR,KLOR-CON) CR tablet 40 mEq (40 mEq Oral Given 01/02/18 1653)  0.9 %  sodium chloride infusion ( Intravenous New Bag/Given 01/02/18 2026)     Initial Impression / Assessment and Plan / ED Course  I have reviewed the triage vital signs and the nursing notes.  Pertinent labs & imaging results that were available during my care of the patient were reviewed by me and considered in my medical decision making (see chart for details).     Michae Grimley Foulkes is a 72 y.o. male with a past  medical history significant for hypertension, hyperlipidemia, CAD status post CABG, diabetes,  prior AAA repair and recent diagnosis of influenza who presents with altered mental status, continued fever, and URI symptoms.  Patient is accompanied by family who reports that patient has been acting more confused over the last several days after diagnosis with influenza.  He has been started on Tamiflu but is continuing to have URI symptoms of cough and congestion.  Patient has not been eating and drinking as much and has been feeling fatigued.  Patient has had productive cough and has had fevers up to 102 earlier today.  Patient has had no GI symptoms or urinary symptoms.  On exam, lungs were clear.  Chest was nontender.  Abdomen was nontender.  Patient had minimal edema seen.  Patient was alert and oriented on my exam however patient still felt that he was confused and family agreed.  No focal neurologic deficits were seen however.  Based on patient's workup, patient found to have acute kidney injury likely from dehydration.  Patient was hypotensive on arrival with a blood pressure in the 70s.  He was given several liters of normal saline with some improvement into the 90s and low 100s.  Given concern for dehydration, infection ongoing, and acute kidney injury, patient admitted to hospitalist service for rehydration and further management.  Patient was not felt to be septic with an improving lactic acid however, do not feel patient is safe for discharge home.  Do not feel patient needs antibiotics as x-ray was negative for pneumonia.  Patient admitted to hospitalist service for further management.   Final Clinical Impressions(s) / ED Diagnoses   Final diagnoses:  Influenza  AKI (acute kidney injury) [facility])    ED Discharge Orders    None      Clinical Impression: 1. Influenza   2. AKI (acute kidney injury) ([place])     Disposition: Admit  This note was prepared with assistance of Dragon voice  recognition software. Occasional wrong-word or sound-a-like substitutions may have occurred due to the inherent limitations of voice recognition software.     Lauraann Missey, Gwenyth Allegra, MD 01/03/18 [phone]

## 2018-01-02 NOTE — Plan of Care (Signed)
Jesse Hansen is a 72 year old male with pmh HTN, HLD, DM2, and recent diagnosed with flu 1-2 days ago on Tamiflu.  Presented with complaints of reported fever, confusion, and diarrhea.  Initial vital signs revealed blood pressures as low as 76/59.  Labs revealed WBC 12.7, potassium 2.9, BUN 25, creatinine 1.8(baseline 1).  Patient given 3 L of normal saline IV fluids with improvement of systolic blood pressures to 100s.  Admitted to a telemetry bed as inpatient.

## 2018-01-03 ENCOUNTER — Inpatient Hospital Stay (HOSPITAL_COMMUNITY): Payer: Medicare Other

## 2018-01-03 DIAGNOSIS — J111 Influenza due to unidentified influenza virus with other respiratory manifestations: Secondary | ICD-10-CM | POA: Diagnosis present

## 2018-01-03 DIAGNOSIS — I959 Hypotension, unspecified: Secondary | ICD-10-CM | POA: Diagnosis present

## 2018-01-03 DIAGNOSIS — Z87891 Personal history of nicotine dependence: Secondary | ICD-10-CM | POA: Diagnosis present

## 2018-01-03 DIAGNOSIS — E872 Acidosis, unspecified: Secondary | ICD-10-CM | POA: Diagnosis present

## 2018-01-03 DIAGNOSIS — I340 Nonrheumatic mitral (valve) insufficiency: Secondary | ICD-10-CM

## 2018-01-03 DIAGNOSIS — E876 Hypokalemia: Secondary | ICD-10-CM | POA: Diagnosis present

## 2018-01-03 DIAGNOSIS — Z720 Tobacco use: Secondary | ICD-10-CM | POA: Diagnosis present

## 2018-01-03 LAB — CBC
HEMATOCRIT: 36 % — AB (ref 39.0–52.0)
Hemoglobin: 12.3 g/dL — ABNORMAL LOW (ref 13.0–17.0)
MCH: 30.2 pg (ref 26.0–34.0)
MCHC: 34.2 g/dL (ref 30.0–36.0)
MCV: 88.5 fL (ref 78.0–100.0)
PLATELETS: 160 10*3/uL (ref 150–400)
RBC: 4.07 MIL/uL — ABNORMAL LOW (ref 4.22–5.81)
RDW: 14.4 % (ref 11.5–15.5)
WBC: 9.2 10*3/uL (ref 4.0–10.5)

## 2018-01-03 LAB — ECHOCARDIOGRAM COMPLETE
Height: 72 in
Weight: 2758.4 oz

## 2018-01-03 LAB — BLOOD GAS, VENOUS
Acid-base deficit: 3.4 mmol/L — ABNORMAL HIGH (ref 0.0–2.0)
Bicarbonate: 22.2 mmol/L (ref 20.0–28.0)
FIO2: 100
O2 CONTENT: 15 L/min
O2 SAT: 22 %
PATIENT TEMPERATURE: 37
PCO2 VEN: 44.3 mmHg (ref 44.0–60.0)
pH, Ven: 7.321 (ref 7.250–7.430)

## 2018-01-03 LAB — PROCALCITONIN: PROCALCITONIN: 0.62 ng/mL

## 2018-01-03 LAB — BASIC METABOLIC PANEL
ANION GAP: 10 (ref 5–15)
BUN: 24 mg/dL — AB (ref 6–20)
CALCIUM: 8.6 mg/dL — AB (ref 8.9–10.3)
CO2: 22 mmol/L (ref 22–32)
Chloride: 107 mmol/L (ref 101–111)
Creatinine, Ser: 1.41 mg/dL — ABNORMAL HIGH (ref 0.61–1.24)
GFR calc Af Amer: 56 mL/min — ABNORMAL LOW (ref >60)
GFR, EST NON AFRICAN AMERICAN: 48 mL/min — AB (ref >60)
GLUCOSE: 74 mg/dL (ref 65–99)
Potassium: 3.3 mmol/L — ABNORMAL LOW (ref 3.5–5.1)
Sodium: 139 mmol/L (ref 135–145)

## 2018-01-03 LAB — MRSA PCR SCREENING: MRSA BY PCR: NEGATIVE

## 2018-01-03 LAB — MAGNESIUM: MAGNESIUM: 1.8 mg/dL (ref 1.7–2.4)

## 2018-01-03 LAB — GLUCOSE, CAPILLARY
Glucose-Capillary: 71 mg/dL (ref 65–99)
Glucose-Capillary: 120 mg/dL — ABNORMAL HIGH (ref 65–99)
Glucose-Capillary: 163 mg/dL — ABNORMAL HIGH (ref 65–99)
Glucose-Capillary: 132 mg/dL — ABNORMAL HIGH (ref 65–99)

## 2018-01-03 LAB — LACTIC ACID, PLASMA
LACTIC ACID, VENOUS: 3.7 mmol/L — AB (ref 0.5–1.9)
Lactic Acid, Venous: 1.7 mmol/L (ref 0.5–1.9)
Lactic Acid, Venous: 1.4 mmol/L (ref 0.5–1.9)

## 2018-01-03 LAB — TROPONIN I
TROPONIN I: 6.19 ng/mL — AB (ref <0.03)
Troponin I: 12.75 ng/mL (ref <0.03)

## 2018-01-03 LAB — HEPARIN LEVEL (UNFRACTIONATED): Heparin Unfractionated: 0.29 IU/mL — ABNORMAL LOW (ref 0.30–0.70)

## 2018-01-03 MED ORDER — ALBUTEROL SULFATE (2.5 MG/3ML) 0.083% IN NEBU
2.5000 mg | INHALATION_SOLUTION | Freq: Four times a day (QID) | RESPIRATORY_TRACT | Status: DC | PRN
  Filled 2018-01-03: qty 3

## 2018-01-03 MED ORDER — LORAZEPAM 1 MG PO TABS
1.0000 mg | ORAL_TABLET | Freq: Two times a day (BID) | ORAL | Status: DC | PRN
  Administered 2018-01-03: 1 mg via ORAL
  Filled 2018-01-03: qty 1

## 2018-01-03 MED ORDER — NICOTINE 14 MG/24HR TD PT24
14.0000 mg | MEDICATED_PATCH | Freq: Every day | TRANSDERMAL | Status: DC
  Administered 2018-01-03 (×2): 14 mg via TRANSDERMAL
  Filled 2018-01-03 (×5): qty 1

## 2018-01-03 MED ORDER — ATORVASTATIN CALCIUM 40 MG PO TABS
40.0000 mg | ORAL_TABLET | Freq: Every evening | ORAL | Status: DC
Start: 2018-01-03 — End: 2018-01-06
  Administered 2018-01-04 – 2018-01-05 (×2): 40 mg via ORAL
  Filled 2018-01-03 (×2): qty 1

## 2018-01-03 MED ORDER — FUROSEMIDE 10 MG/ML IJ SOLN
INTRAMUSCULAR | Status: AC
  Filled 2018-01-03: qty 4

## 2018-01-03 MED ORDER — BENZONATATE 100 MG PO CAPS: 100.0000 mg | ORAL_CAPSULE | Freq: Three times a day (TID) | ORAL | Status: DC | PRN

## 2018-01-03 MED ORDER — METOPROLOL TARTRATE 12.5 MG HALF TABLET
12.5000 mg | ORAL_TABLET | Freq: Two times a day (BID) | ORAL | Status: DC
  Administered 2018-01-03 – 2018-01-04 (×3): 12.5 mg via ORAL
  Filled 2018-01-03 (×3): qty 1

## 2018-01-03 MED ORDER — PERFLUTREN LIPID MICROSPHERE
1.0000 mL | INTRAVENOUS | Status: AC | PRN
  Administered 2018-01-03: 4 mL via INTRAVENOUS
  Filled 2018-01-03: qty 10

## 2018-01-03 MED ORDER — OSELTAMIVIR PHOSPHATE 30 MG PO CAPS
30.0000 mg | ORAL_CAPSULE | Freq: Two times a day (BID) | ORAL | Status: DC
  Administered 2018-01-03 – 2018-01-06 (×8): 30 mg via ORAL
  Filled 2018-01-03 (×9): qty 1

## 2018-01-03 MED ORDER — POTASSIUM CHLORIDE CRYS ER 20 MEQ PO TBCR
40.0000 meq | EXTENDED_RELEASE_TABLET | Freq: Once | ORAL | Status: AC
  Administered 2018-01-03: 40 meq via ORAL
  Filled 2018-01-03: qty 2

## 2018-01-03 MED ORDER — FUROSEMIDE 10 MG/ML IJ SOLN
60.0000 mg | Freq: Once | INTRAMUSCULAR | Status: AC
  Administered 2018-01-03: 60 mg via INTRAVENOUS

## 2018-01-03 MED ORDER — ORAL CARE MOUTH RINSE
15.0000 mL | Freq: Two times a day (BID) | OROMUCOSAL | Status: DC
  Administered 2018-01-04 (×2): 15 mL via OROMUCOSAL

## 2018-01-03 MED ORDER — ACETAMINOPHEN 325 MG PO TABS: 650.0000 mg | ORAL_TABLET | Freq: Four times a day (QID) | ORAL | Status: DC | PRN

## 2018-01-03 MED ORDER — CLOPIDOGREL BISULFATE 75 MG PO TABS
75.0000 mg | ORAL_TABLET | Freq: Every day | ORAL | Status: DC
  Administered 2018-01-03 – 2018-01-06 (×4): 75 mg via ORAL
  Filled 2018-01-03 (×4): qty 1

## 2018-01-03 MED ORDER — FUROSEMIDE 10 MG/ML IJ SOLN
INTRAMUSCULAR | Status: AC
  Filled 2018-01-03: qty 2

## 2018-01-03 MED ORDER — HEPARIN (PORCINE) IN NACL 100-0.45 UNIT/ML-% IJ SOLN
1100.0000 [IU]/h | INTRAMUSCULAR | Status: DC
  Administered 2018-01-03: 1000 [IU]/h via INTRAVENOUS
  Filled 2018-01-03: qty 250

## 2018-01-03 MED ORDER — ASPIRIN EC 81 MG PO TBEC
81.0000 mg | DELAYED_RELEASE_TABLET | Freq: Every day | ORAL | Status: DC
  Administered 2018-01-03 – 2018-01-04 (×3): 81 mg via ORAL
  Filled 2018-01-03 (×4): qty 1

## 2018-01-03 MED ORDER — CHLORHEXIDINE GLUCONATE 0.12 % MT SOLN
15.0000 mL | Freq: Two times a day (BID) | OROMUCOSAL | Status: DC
  Administered 2018-01-03 – 2018-01-06 (×6): 15 mL via OROMUCOSAL
  Filled 2018-01-03 (×6): qty 15

## 2018-01-03 MED ORDER — FAMOTIDINE 20 MG PO TABS
20.0000 mg | ORAL_TABLET | Freq: Two times a day (BID) | ORAL | Status: DC
  Administered 2018-01-03 – 2018-01-06 (×8): 20 mg via ORAL
  Filled 2018-01-03 (×8): qty 1

## 2018-01-03 MED ORDER — NITROGLYCERIN 0.4 MG SL SUBL: 0.4000 mg | SUBLINGUAL_TABLET | SUBLINGUAL | Status: DC | PRN

## 2018-01-03 MED ORDER — HEPARIN BOLUS VIA INFUSION
3000.0000 [IU] | Freq: Once | INTRAVENOUS | Status: AC
  Administered 2018-01-03: 3000 [IU] via INTRAVENOUS
  Filled 2018-01-03: qty 3000

## 2018-01-03 NOTE — Progress Notes (Signed)
Patient appears confused.  He took his clothes off and stated he was hot with shortness of breath and respirations of 25.  Reported elevated BP to instructor, Waneta Martins, RN who in turn reported elevation to Vivi Ferns, RN.  Respiratory therapy up to do breathing treatment who then called rapid response team.

## 2018-01-03 NOTE — Progress Notes (Signed)
Pt asleep, no increased wob/respiratory distress noted.  Pt on room air, HR67, rr25, spo2 93%.  Bipap remains in room on standby as needed.

## 2018-01-03 NOTE — Consult Note (Signed)
PULMONARY / CRITICAL CARE MEDICINE   Name: Jesse Hansen MRN: [id number] DOB: January 05, 1946    ADMISSION DATE:  01/02/2018 CONSULTATION DATE:  2/12  REFERRING MD:  Erlinda Hong  CHIEF COMPLAINT:  Acute hypoxic respiratory failure    HISTORY OF PRESENT ILLNESS:   This is a 72 year old male patient with a significant history of coronary artery disease with prior coronary artery bypass grafting, EF estimated at 40% with hypokinesis back in 2017, tobacco abuse, hypertension, diabetes, posttraumatic stress disorder.  Was diagnosed with influenza on 2/9, and placed on Tamiflu.  In spite of this he is continued to have persistent fevers, worsening weakness, diarrhea, urinary incontinence, and some confusion.  Due to this he presented to the emergency room on 2/11.  In the emergency room he was found to have a systolic blood pressure in the 60s creatinine of 1.8 (baseline 1), and chest x-ray with Mild interstitial prominence.  Of influenza, dehydration with hypovolemia, and acute kidney injury.  He was given IV hydration, continued on Tamiflu, and admitted to the telemetry unit given a history of coronary artery disease.  During a.m. rounds on 2/12 patient actually reported feeling better.  Shortly after 12 noon patient began to have increased anxiety, he was asking for his nitroglycerin tablets,And progressed to become more short of breath with worsening work of breathing and confusion.  At approximately 2:00 PM rapid response call was initiated as the patient was found to be in acute distress.  On arrival to the room he was confused, mottled, and hypoxic.  His systolic blood pressure was in the mid 200s.  He was placed on noninvasive positive pressure ventilation, given IV Lasix, and emergently transferred to the intensive care were pulmonary critical care consult was requested  PAST MEDICAL HISTORY :  He  has a past medical history of ANXIETY (08/23/2009), COLONIC POLYPS, HX OF (01/29/2011), Coronary artery disease  involving native coronary artery with angina pectoris ([place]) (02/23/2008), DIABETES MELLITUS, TYPE II (01/29/2011), ERECTILE DYSFUNCTION (08/15/2007), HYPERLIPIDEMIA (02/23/2008), HYPERTENSION (08/15/2007), INSOMNIA-SLEEP DISORDER-UNSPEC (02/23/2008), PERIPHERAL VASCULAR DISEASE (02/23/2008), and PTSD (07/30/2010).  PAST SURGICAL HISTORY: He  has a past surgical history that includes Coronary stent placement (2007); Prostate biopsy (2009); Coronary artery bypass graft (2013); Carotid endarterectomy (Left); Abdominal aortic aneurysm repair (1996?); and CAROTID PTA/STENT INTERVENTION (Left, 03/24/2017).  Allergies  Allergen Reactions  . Codeine Nausea And Vomiting  . Nicotine     Nicotine Patch--Per Nurse "heart issues"    No current facility-administered medications on file prior to encounter.    Current Outpatient Medications on File Prior to Encounter  Medication Sig  . acetaminophen (TYLENOL) 325 MG tablet Take 650 mg by mouth every 6 (six) hours as needed.  Marland Kitchen albuterol (PROVENTIL) (2.5 MG/3ML) 0.083% nebulizer solution Take 2.5 mg by nebulization every 6 (six) hours as needed for wheezing or shortness of breath.  Marland Kitchen aspirin EC 81 MG tablet Take 1 tablet (81 mg total) by mouth daily. (Patient taking differently: Take 81 mg by mouth at bedtime. )  . atorvastatin (LIPITOR) 80 MG tablet Take 40 mg by mouth every evening.   . benzonatate (TESSALON) 100 MG capsule Take by mouth 3 (three) times daily as needed for cough.  . cholecalciferol (VITAMIN D) 1000 units tablet Take 1,000 Units by mouth at bedtime.  . clopidogrel (PLAVIX) 75 MG tablet Take 75 mg by mouth daily.  . furosemide (LASIX) 20 MG tablet Take 20 mg by mouth daily.  Marland Kitchen glipiZIDE (GLUCOTROL) 5 MG tablet Take 5 mg by mouth  2 (two) times daily before a meal. Take 30 minutes prior to meals.  . isosorbide dinitrate (ISORDIL) 20 MG tablet Take 40 mg by mouth 3 (three) times daily.   Marland Kitchen lisinopril (PRINIVIL,ZESTRIL) 5 MG tablet Take 2.5 mg by mouth daily.   Marland Kitchen LORazepam (ATIVAN) 1 MG tablet TAKE 2 TABLETS BY MOUTH EVERY NIGHT AT BEDTIME AS NEEDED  . metFORMIN (GLUCOPHAGE) 500 MG tablet Take 1 tablet (500 mg total) by mouth 2 (two) times daily with a meal.  . metoprolol tartrate (LOPRESSOR) 25 MG tablet Take 12.5 mg by mouth 2 (two) times daily.  . nitroGLYCERIN (NITROSTAT) 0.4 MG SL tablet Place 0.4 mg under the tongue every 5 (five) minutes as needed for chest pain.  Marland Kitchen oseltamivir (TAMIFLU) 75 MG capsule Take 75 mg by mouth.  . ranitidine (ZANTAC) 150 MG tablet Take 150 mg by mouth 2 (two) times daily as needed for heartburn.    FAMILY HISTORY:  His indicated that the status of his father is unknown.   SOCIAL HISTORY: He  reports that he has been smoking cigarettes.  he has never used smokeless tobacco. He reports that he drinks alcohol. He reports that he does not use drugs.  REVIEW OF SYSTEMS:   Unable  SUBJECTIVE:  Feels a little better on BiPAP  VITAL SIGNS: BP (!) 187/107 Comment: initiated rapid response  Pulse (!) 119   Temp (!) 97.3 F (36.3 C) (Oral)   Resp (!) 24   Ht 6' (1.829 m)   Wt 172 lb 6.4 oz (78.2 kg)   SpO2 95%   BMI 23.38 kg/m    HEMODYNAMICS:    VENTILATOR SETTINGS:    INTAKE / OUTPUT: I/O last 3 completed shifts: In: 1416.3 [I.V.:416.3; IV Piggyback:1000] Out: 300 [Urine:300]  PHYSICAL EXAMINATION: General: Acutely ill-appearing 72 year old white male currently on noninvasive positive pressure ventilation Neuro: He is awake, follows commands, is oriented to person only unable to tell me place or time there are no focal motor deficits HEENT: No obvious jugular venous distention currently has BiPAP mask in place Cardiovascular: Tachycardic regular rhythm Lungs: Scattered rales and rhonchi, marked accessory use however this is improving some Abdomen: Soft, nontender, hypoactive Musculoskeletal: Equal strength and bulk Skin: Cool and mottled  LABS:  BMET Recent Labs  Lab 01/02/18 1550  01/03/18 0410  NA 135 139  K 2.9* 3.3*  CL 100* 107  CO2 23 22  BUN 25* 24*  CREATININE 1.80* 1.41*  GLUCOSE 118* 74    Electrolytes Recent Labs  Lab 01/02/18 1550 01/03/18 0410  CALCIUM 9.3 8.6*  MG  --  1.8    CBC Recent Labs  Lab 01/02/18 1550 01/03/18 0410  WBC 12.7* 9.2  HGB 13.9 12.3*  HCT 39.9 36.0*  PLT 194 160    Coag's Recent Labs  Lab 01/02/18 1550  INR 1.23    Sepsis Markers Recent Labs  Lab 01/02/18 1756 01/03/18 0028 01/03/18 0410  LATICACIDVEN 2.03* 1.7 1.4    ABG No results for input(s): PHART, PCO2ART, PO2ART in the last 168 hours.  Liver Enzymes Recent Labs  Lab 01/02/18 1550  AST 89*  ALT 20  ALKPHOS 60  BILITOT 1.3*  ALBUMIN 3.8    Cardiac Enzymes No results for input(s): TROPONINI, PROBNP in the last 168 hours.  Glucose Recent Labs  Lab 01/03/18 0756 01/03/18 1100  GLUCAP 71 120*    Imaging US Renal  Result Date: 01/03/2018 CLINICAL DATA:  Acute kidney injury. EXAM: RENAL / URINARY TRACT ULTRASOUND  COMPLETE COMPARISON:  None. FINDINGS: Right Kidney: Length: 14.3 cm. Echogenicity within normal limits. No mass or hydronephrosis visualized. Left Kidney: Length: 11.8 cm. Slight diffuse thinning of the renal cortex as compared to the opposite side. Echogenicity within normal limits. No mass or hydronephrosis visualized. Bladder: Appears normal for degree of bladder distention. IMPRESSION: 1. No hydronephrosis. 2. Slight diffuse thinning of the left renal cortex, nonspecific. Electronically Signed   By: Lorriane Shire M.D.   On: 01/03/2018 10:27   Dg Chest Port 1 View  Result Date: 01/03/2018 CLINICAL DATA:  Respiratory compromise EXAM: PORTABLE CHEST 1 VIEW COMPARISON:  01/02/2018 FINDINGS: Postop CABG. Bilateral interstitial edema. No significant pleural effusion. Negative for focal infiltrate. Cardiac enlargement. IMPRESSION: Cardiac enlargement with interstitial edema compatible with mild heart failure. Electronically  Signed   By: Franchot Gallo M.D.   On: 01/03/2018 14:22   Dg Chest Port 1 View  Result Date: 01/02/2018 CLINICAL DATA:  Fever, diagnosed with the flu 2 days ago, history coronary artery disease, hypertension, diabetes mellitus, smoker EXAM: PORTABLE CHEST 1 VIEW COMPARISON:  Portable exam 1558 hours compared to 01/25/2016 FINDINGS: Normal heart size post CABG. Atherosclerotic calcification aorta. Mediastinal contours and pulmonary vascularity normal. Minimal chronic interstitial prominence stable. No acute infiltrate, pleural effusion or pneumothorax. Bones unremarkable. IMPRESSION: Post CABG. Minimal chronic interstitial prominence without acute infiltrate. Electronically Signed   By: Lavonia Dana M.D.   On: 01/02/2018 16:16     STUDIES:  ECHO 2/12>>>  CULTURES: BCX2 2/12>>> ustrep 2/12>>>   ANTIBIOTICS: tamiflu 2/11>>>  SIGNIFICANT EVENTS:  LINES/TUBES:   DISCUSSION: Favor acute on chronic HF superimposed on influenza   ASSESSMENT / PLAN:  Acute hypoxic respiratory failure in the setting of progressive pulmonary infiltrates.  Suspect this represents pulmonary edema superimposed on underlying influenza infection.  Additionally could consider evolving lung injury versus less likely pulmonary emboli given the progression of infiltrates on chest x-ray Chest x-ray personally reviewed: This demonstrates worsening pulmonary infiltrates when compared to admission Plan Continue noninvasive positive pressure ventilation Agree with preload reduction via with diuresis, IV heparin (while we rule out cardiac ischemic event) Serial cardiac enzymes Echocardiogram Will need to reassess, hope to avoid intubation We will make him n.p.o. KVO IV fluids Continue Tamiflu, with plan to complete 5 days Check procalcitonin Check BNP Cont bb   History of coronary artery disease with systolic heart failure Acute on chronic heart failure Hypertensive crisis-flash pulmonary edema Plan Continue  noninvasive positive pressure ventilation Continued IV diuresis Reassess blood pressure, may need more aggressive blood pressure control Continue aspirin and Plavix  Acute kidney injury.  Serum creatinine peaked at 1.8, now trending down Plan KVO IV fluids Renal dose medications Strict I&O Follow-up a.m. chemistry  Hypokalemia Plan Replace/recheck  Acute encephalopathy.  Suspect hypoxia related however sepsis also a possibility Plan Continue supportive care  Diabetes Plan Sliding scale insulin  Mild anemia w/out evidence of bleeding Plan Trend CBC  FAMILY  - Updates:   Erick Colace ACNP-BC [place] Pager # [phone] OR # [phone] if no answer     01/03/2018, 2:34 PM

## 2018-01-03 NOTE — Progress Notes (Signed)
CRITICAL VALUE ALERT  Critical Value:  Lactic acid-3.7, troponin-6.19  Date & Time Notied:  01/03/2018 @ 2020  Provider Notified: Dr. Jimmy Footman  Orders Received/Actions taken:

## 2018-01-03 NOTE — Progress Notes (Addendum)
[place] for Heparin Indication: chest pain/ACS  Allergies  Allergen Reactions  . Codeine Nausea And Vomiting  . Nicotine     Nicotine Patch--Per Nurse "heart issues"   Patient Measurements: Height: 6' (182.9 cm) Weight: 172 lb 6.4 oz (78.2 kg) IBW/kg (Calculated) : 77.6 Heparin Dosing Weight: 78 kg  Vital Signs: Temp: 97.3 F (36.3 C) (02/12 1300) Temp Source: Oral (02/12 1300) BP: 187/107 (02/12 1358) Pulse Rate: 119 (02/12 1358)  Labs: Recent Labs    01/02/18 1550 01/03/18 0410  HGB 13.9 12.3*  HCT 39.9 36.0*  PLT 194 160  LABPROT 15.4*  --   INR 1.23  --   CREATININE 1.80* 1.41*   Estimated Creatinine Clearance: 51.2 mL/min (A) (by C-G formula based on SCr of 1.41 mg/dL (H)).  Medical History: Past Medical History:  Diagnosis Date  . ANXIETY 08/23/2009   Qualifier: Diagnosis of  By: Jenny Reichmann MD, [place], HX OF 01/29/2011   Qualifier: Diagnosis of  By: Jenny Reichmann MD, [place]   . Coronary artery disease involving native coronary artery with angina pectoris ([place]) 02/23/2008   s/p Multiple PCIs; CABG x 3 in 2013 (SVG-OM1, SVG-Diag with freeLIMA-LAD from SVG-Diag hood) --> relook cath in ~2015 with at least 1 graft ~occluded by report  . DIABETES MELLITUS, TYPE II 01/29/2011   Qualifier: Diagnosis of  By: Jenny Reichmann MD, [place] ERECTILE DYSFUNCTION 08/15/2007   Qualifier: Diagnosis of  By: Dance CMA ([place]), [place]    . HYPERLIPIDEMIA 02/23/2008   Qualifier: Diagnosis of  By: Jenny Reichmann MD, [place] Oris   . HYPERTENSION 08/15/2007   Qualifier: Diagnosis of  By: Dance CMA ([place]), Kim    . INSOMNIA-SLEEP DISORDER-UNSPEC 02/23/2008   Qualifier: Diagnosis of  By: Jenny Reichmann MD, [place] Or[place]   . PERIPHERAL VASCULAR DISEASE 02/23/2008   Qualifier: Diagnosis of  By: Jenny Reichmann MD, [place] Oris   . PTSD 07/30/2010   Qualifier: Diagnosis of  By: Jenny Reichmann MD, [place] Or[place]    Medications:  Scheduled:  . aspirin EC  81 mg Oral QHS  . atorvastatin  40 mg Oral QPM  . clopidogrel  75  mg Oral Daily  . famotidine  20 mg Oral BID  . furosemide      . furosemide      . heparin  3,000 Units Intravenous Once  . insulin aspart  0-9 Units Subcutaneous TID WC  . metoprolol tartrate  12.5 mg Oral BID  . nicotine  14 mg Transdermal Daily  . oseltamivir  30 mg Oral BID   Infusions:  . sodium chloride Stopped (01/03/18 1400)  . heparin     Assessment: 32 yoM to ED 2/11 with 2-3 day hx of flu - begun on Tamiflu as outpt, continues with fever. PMH: HTN, CAD/CABG, DM2, Tobacco use  Today, 01/03/2018  Patient c/o chest pain, asked wife for NTG tablet.  Noted SHOB, rapid response called, transfer to ICU, begin IV Heparin, r/o ACS/STEMI.  Serial Troponins ordered  Unable to perform CT, SCr 1.41 this am, Cl ~ 50 ml/min  Received Lovenox 40mg  x1 this am ~ 0100  Goal of Therapy:  Heparin level 0.3-0.7 units/ml Monitor platelets by anticoagulation protocol: Yes   Plan:   Reduced Heparin bolus with previous Lovenox dose  Heparin bolus 3000 units, infusion at 1000 units/hr  Obtain first Hep level in 8 hr after start of infusion  Daily CBC ordered, begin daily Heparin level at steady state  Minda Ditto PharmD Pager [phone] 01/03/2018, 2:48 PM

## 2018-01-03 NOTE — Progress Notes (Signed)
ANTICOAGULATION CONSULT NOTE - Follow Up Consult  Pharmacy Consult for Heparin Indication: chest pain/ACS  Allergies  Allergen Reactions  . Codeine Nausea And Vomiting  . Nicotine     Nicotine Patch--Per Nurse "heart issues"    Patient Measurements: Height: 6' (182.9 cm) Weight: 170 lb 10.2 oz (77.4 kg) IBW/kg (Calculated) : 77.6 Heparin Dosing Weight:   Vital Signs: Temp: 98.7 F (37.1 C) (02/12 2308) Temp Source: Oral (02/12 2308) BP: 106/57 (02/12 2300) Pulse Rate: 62 (02/12 2310)  Labs: Recent Labs    01/02/18 1550 01/03/18 0410 01/03/18 1503 01/03/18 2255  HGB 13.9 12.3*  --   --   HCT 39.9 36.0*  --   --   PLT 194 160  --   --   LABPROT 15.4*  --   --   --   INR 1.23  --   --   --   HEPARINUNFRC  --   --   --  0.29*  CREATININE 1.80* 1.41*  --   --   TROPONINI  --   --  6.19*  --     Estimated Creatinine Clearance: 51.1 mL/min (A) (by C-G formula based on SCr of 1.41 mg/dL (H)).   Medications:  Infusions:  . heparin 1,000 Units/hr (01/03/18 1900)    Assessment: Patient with low heparin level.  No heparin issues per RN.  Goal of Therapy:  Heparin level 0.3-0.7 units/ml Monitor platelets by anticoagulation protocol: Yes   Plan:  Increase heparin to 1100 units/hr Recheck level at [place], Shea Stakes [place] 01/03/2018,11:40 PM

## 2018-01-03 NOTE — Progress Notes (Signed)
  Echocardiogram 2D Echocardiogram with Definity has been performed.  Darlina Sicilian M 01/03/2018, 4:06 PM

## 2018-01-03 NOTE — Significant Event (Signed)
Rapid Response Event Note  Overview: Time Called: [place] Time: [place] by 4th Floor Staff in regards to patient needing rapid response. Upon arrival, patient was lying in chair with NRB placed by Respiratory Therapy. Patient was in respiratory distress, utilizing accessory muscles to breathe, RR 30-40 times a minute. BIPAP notified for due to patient decompensating. MD XU paged.    Initial Focused Assessment: Neuro: Patient alert and oriented x4, able to follow simple commands, able to move all extremities with ease. Pupils 3+, round, equal and reactive to light bilaterally. No signs of pain expressed. Cardiac:S1 and S2 heart sounds heard upon auscultation, ST per rhythm, 1+ pulses palpated radially and pedally in upper and lower extremities, patient BP HTN, see vital signs. Pulmonary: Breath sounds auscultated were rhonchi in all lung fields, patient utilizing accessory muscles to breathe. O2 Sats 98-100%, RR 30-40 per min.  Skin: Mottling from chest to lower extremities   Interventions: Trx patient to bed from bed Placed on BiPAP ABG ordered Trx to ICU/SD MD XU Paged and at bedside   Plan of Care (if not transferred):  Zandrea Kenealy C

## 2018-01-03 NOTE — Progress Notes (Signed)
01/03/18  1245  Patient is asking his wife for NTG tablets. Wife has some of the patient's home medicines in her purse. Advised wife not to give any medicines and that I would notify MD and see if she wants to order any medicines. Dr Erlinda Hong notified.

## 2018-01-03 NOTE — Plan of Care (Signed)
  Nutrition: Adequate nutrition will be maintained 01/03/2018 1017 - Progressing by Dorene Sorrow, RN   Pain Managment: General experience of comfort will improve 01/03/2018 1017 - Progressing by Dorene Sorrow, RN   Safety: Ability to remain free from injury will improve 01/03/2018 1017 - Progressing by Dorene Sorrow, RN

## 2018-01-03 NOTE — Evaluation (Signed)
Physical Therapy Evaluation Patient Details Name: Jesse Hansen MRN: [id number] DOB: 1946-05-13 Today's Date: 01/03/2018   History of Present Illness  72yo male recently diagnosed with flu and put on Tamiflu, but continued to have persistent fevers and increased weakness, loose bowels. Diagnosed with AKI, flu, orthostatic hypotension. PMH HTN, CAD with CABG, DM, PTSD, tobacco abuse, anxiety, PVD, hx AAA repair, carotid stent   Clinical Impression   Patient received in bed with spouse present, who provided majority of PLOF/home/equipment history. Noted resting HR to be 118-120BPM in supine, O2 remained in 90s throughout session on room air. Patient able to perform supine to sit transfer with Mod(I) and increased time, HR remained elevated sitting at EOB at 120-123 BPM and patient very fatigued. Did not test functional transfers or gait due to safety concerns secondary to elevated HR and patient fatigue/status this afternoon. Of note, RN reports that patient has been able to get up and out of bed to use the bathroom and has moved well with nursing staff, they really have only had to manage IV pole per RN report. Patient left in bed with family and nursing student present. He may benefit from skilled HHPT services moving forward, however PT plan to monitor closely and update recommendation as appropriate as patient is safely able to tolerate more activity.     Follow Up Recommendations Home health PT;Other (comment)(may update as appropriate when patient is able to tolerate more activity )    Equipment Recommendations  None recommended by PT    Recommendations for Other Services       Precautions / Restrictions Precautions Precautions: Fall;Other (comment) Precaution Comments: watch O2 sats and HR; droplet precautions  Restrictions Weight Bearing Restrictions: No      Mobility  Bed Mobility Overal bed mobility: Modified Independent             General bed mobility comments:  increased time and effort   Transfers                 General transfer comment: DNT due to HR limitations   Ambulation/Gait             General Gait Details: DNT due to HR limitations   Stairs            Wheelchair Mobility    Modified Rankin (Stroke Patients Only)       Balance Overall balance assessment: No apparent balance deficits (not formally assessed)                                           Pertinent Vitals/Pain Pain Assessment: No/denies pain(first reported that he did not have pain, later stated he did have pain but unable to tell PT/nurse where it was )    Home Living Family/patient expects to be discharged to:: Private residence Living Arrangements: Spouse/significant other Available Help at Discharge: Family Type of Home: House Home Access: Level entry     Home Layout: One level Home Equipment: None      Prior Function Level of Independence: Independent         Comments: wife reports patient was doing very well before, worked on his car every day      Hand Dominance        Extremity/Trunk Assessment        Lower Extremity Assessment Lower Extremity Assessment: Generalized weakness    Cervical /  Trunk Assessment Cervical / Trunk Assessment: Normal  Communication   Communication: HOH  Cognition Arousal/Alertness: Awake/alert Behavior During Therapy: WFL for tasks assessed/performed Overall Cognitive Status: Within Functional Limits for tasks assessed                                        General Comments General comments (skin integrity, edema, etc.): RN reports patient has been able to get up and down to the bathroom with staff just managing IV pole, in general patient has moved well with them; PT found resting HR to be 120BPM at rest, 122BPM sitting EOB. Session severely limited secondary to elevated HR.     Exercises     Assessment/Plan    PT Assessment Patient needs  continued PT services  PT Problem List Decreased strength;Decreased mobility;Decreased safety awareness;Decreased coordination;Decreased balance;Decreased activity tolerance       PT Treatment Interventions DME instruction;Therapeutic activities;Therapeutic exercise;Gait training;Patient/family education;Stair training;Balance training;Functional mobility training;Neuromuscular re-education    PT Goals (Current goals can be found in the Care Plan section)  Acute Rehab PT Goals Patient Stated Goal: to feel better  PT Goal Formulation: With patient/family Time For Goal Achievement: 01/17/18 Potential to Achieve Goals: Good    Frequency Min 3X/week   Barriers to discharge        Co-evaluation               AM-PAC PT "6 Clicks" Daily Activity  Outcome Measure Difficulty turning over in bed (including adjusting bedclothes, sheets and blankets)?: None Difficulty moving from lying on back to sitting on the side of the bed? : None Difficulty sitting down on and standing up from a chair with arms (e.g., wheelchair, bedside commode, etc,.)?: None Help needed moving to and from a bed to chair (including a wheelchair)?: A Little Help needed walking in hospital room?: A Little Help needed climbing 3-5 steps with a railing? : A Little 6 Click Score: 21    End of Session   Activity Tolerance: Patient limited by fatigue Patient left: in bed;with call bell/phone within reach;with family/visitor present   PT Visit Diagnosis: Muscle weakness (generalized) (M62.81)    Time: 7035-0093 PT Time Calculation (min) (ACUTE ONLY): 12 min   Charges:   PT Evaluation $PT Eval Moderate Complexity: 1 Mod     PT G Codes:        Deniece Ree PT, DPT, CBIS  Supplemental Physical Therapist [place]   Pager [phone]

## 2018-01-03 NOTE — Progress Notes (Addendum)
PROGRESS NOTE  Jesse Hansen WPY:[id number] DOB: 1946-09-18 DOA: 01/02/2018 PCP: Jesse Borg, MD  Brief summary:  Patient was diagnosed with flu two days ago at outside ED, he is brought to ED at [facility] long due to fever 102 at home and acute onset of confusion. He has hypotension, sbp in the 70's initially  Labs and blood pressure Improving, but remain confused    HPI/Recap of past 24 hours:   confused elderly not oriented to time, know he is in the hospital, can not states the name He denies pain, no fever Wife at bedside  He developed acute dyspnea, acute diffuse rhonchi on lung exam, acute skin mottling from chest to  Lower extremities  Assessment/Plan: Principal Problem:   AKI (acute kidney injury) ([place]) Active Problems:   Hx of CABG x 3 2013   Tobacco abuse   Influenza   Lactic acidosis   Hypokalemia   Hypotension    Acute respiratory distress -Sudden onset, prior to this patient asked for prn nitroglycerin -Differential including flush pulmonary edema/acs/PE -He is put on bipap, transfer to icu -Stat abg/cxr/ekg/echo, cycle troponin -Though he does not have edema on exam, lung exam with acute rhonchi, will d/c ivf, give lasix 60mg  x1, his blood pressure is elevated. empirically start heparin drip -Case discussed with CArdiologist Dr Jesse Hansen, critical care Mr Jesse Hansen  Fever /+flu Jesse Hansen /aki on presentation -Had fever 102 at home, no fever since admission -he received ivf, now off ivf due to acute respiratory distress -Continue tamiflu renal dosing  Severe dehydration/hypotension ( sbp in the 70's on presentation)/AKI/lactic acidosis On presentation, bp /cr /lactic acid improved with hydration Restart on coreg  Acute Metabolic encephalopathy: Presented on admission Wife reports this is acute, patient does not have h/o dementia, no confusion prior to flu diagnosis Treat underline disease, expect improvement  CAD s/p CABG, systolic chf,  chronic LBBB :  continue asa/plavix, statin, coreg He is started on heparin drip for now Repeat echo pending  noninsulin dependent dm2, a1c pending On ssi here  Code Status: full  Family Communication: patient and wife  Disposition Plan: transfer to ICU on bipap, at risk of intubation   Consultants:  Cardiology  Critical care  Procedures:  bipap  Antibiotics:  none   Objective: BP (!) 110/44 (BP Location: Right Arm)   Pulse 73   Temp 98.6 F (37 C) (Oral)   Resp 20   Ht 6' (1.829 m)   Wt 78.2 kg (172 lb 6.4 oz)   SpO2 100%   BMI 23.38 kg/m   Intake/Output Summary (Last 24 hours) at 01/03/2018 0804 Last data filed at 01/03/2018 0630 Gross per 24 hour  Intake 1416.25 ml  Output 300 ml  Net 1116.25 ml   Filed Weights   01/02/18 1537 01/02/18 2230  Weight: 84.4 kg (186 lb) 78.2 kg (172 lb 6.4 oz)    Exam: Patient is examined daily including today on 01/03/2018, exams remain the same as of yesterday except that has changed    General:  In respiratory distress  Cardiovascular: sinus tachycardia  Respiratory: diffuse rhonchi in all lung fields  Abdomen: Soft/ND/NT, positive BS  Musculoskeletal: No Edema  Neuro: alert, not oriented   Data Reviewed: Basic Metabolic Panel: Recent Labs  Lab 01/02/18 1550 01/03/18 0410  NA 135 139  K 2.9* 3.3*  CL 100* 107  CO2 23 22  GLUCOSE 118* 74  BUN 25* 24*  CREATININE 1.80* 1.41*  CALCIUM 9.3 8.6*  MG  --  1.8   Liver Function Tests: Recent Labs  Lab 01/02/18 1550  AST 89*  ALT 20  ALKPHOS 60  BILITOT 1.3*  PROT 7.4  ALBUMIN 3.8   No results for input(s): LIPASE, AMYLASE in the last 168 hours. No results for input(s): AMMONIA in the last 168 hours. CBC: Recent Labs  Lab 01/02/18 1550 01/03/18 0410  WBC 12.7* 9.2  NEUTROABS 9.6*  --   HGB 13.9 12.3*  HCT 39.9 36.0*  MCV 86.9 88.5  PLT 194 160   Cardiac Enzymes:   No results for input(s): CKTOTAL, CKMB, CKMBINDEX, TROPONINI in the  last 168 hours. BNP (last 3 results) No results for input(s): BNP in the last 8760 hours.  ProBNP (last 3 results) No results for input(s): PROBNP in the last 8760 hours.  CBG: Recent Labs  Lab 01/03/18 0756  GLUCAP 71    No results found for this or any previous visit (from the past 240 hour(s)).   Studies: Dg Chest Port 1 View  Result Date: 01/02/2018 CLINICAL DATA:  Fever, diagnosed with the flu 2 days ago, history coronary artery disease, hypertension, diabetes mellitus, smoker EXAM: PORTABLE CHEST 1 VIEW COMPARISON:  Portable exam 1558 hours compared to 01/25/2016 FINDINGS: Normal heart size post CABG. Atherosclerotic calcification aorta. Mediastinal contours and pulmonary vascularity normal. Minimal chronic interstitial prominence stable. No acute infiltrate, pleural effusion or pneumothorax. Bones unremarkable. IMPRESSION: Post CABG. Minimal chronic interstitial prominence without acute infiltrate. Electronically Signed   By: Jesse Hansen M.D.   On: 01/02/2018 16:16    Scheduled Meds: . aspirin EC  81 mg Oral QHS  . atorvastatin  40 mg Oral QPM  . clopidogrel  75 mg Oral Daily  . enoxaparin (LOVENOX) injection  40 mg Subcutaneous QHS  . famotidine  20 mg Oral BID  . insulin aspart  0-9 Units Subcutaneous TID WC  . nicotine  14 mg Transdermal Daily  . oseltamivir  30 mg Oral BID    Continuous Infusions: . sodium chloride 75 mL/hr at 01/03/18 5809   Total Critical Care time in examining the patient bedside, evaluating Lab work and other data, over half of the total time was spent in coordinating patient care on the floor or bedisde in talking to patient/family members, communicating with nursing Staff on the floor and sub specialists cardiology and critical care to coordinate patients medical care and needs is  80 Minutes.  The condition which has caused critical injury/acute impairment of vital organ system with a high probability of sudden clinically significant  deterioration and can cause Potential Life threatening injury to this patient addressed today is acute respiratory distress/ concerning for flush pulmonary edema/acs/pe  I have personally reviewed and interpreted on  01/03/2018 daily labs, tele strips, imagings as discussed above under date review session and assessment and plans.  I reviewed all nursing notes, pharmacy notes, consultant notes,  vitals, pertinent old records  I have discussed plan of care as described above with RN , patient and family on 01/03/2018   Florencia Reasons MD, PhD  [facility] Pager [phone]. If 7PM-7AM, please contact night-coverage at [email or web address], password [email or web address] 01/03/2018, 8:04 AM  LOS: 1 day

## 2018-01-04 ENCOUNTER — Inpatient Hospital Stay (HOSPITAL_COMMUNITY): Payer: Medicare Other

## 2018-01-04 ENCOUNTER — Encounter (HOSPITAL_COMMUNITY): Payer: Self-pay | Admitting: Physician Assistant

## 2018-01-04 DIAGNOSIS — N179 Acute kidney failure, unspecified: Secondary | ICD-10-CM

## 2018-01-04 DIAGNOSIS — I214 Non-ST elevation (NSTEMI) myocardial infarction: Secondary | ICD-10-CM

## 2018-01-04 LAB — LIPID PANEL
CHOL/HDL RATIO: 4.2 ratio
Cholesterol: 96 mg/dL (ref 0–200)
HDL: 23 mg/dL — ABNORMAL LOW (ref >40)
LDL Cholesterol: 46 mg/dL (ref 0–99)
Triglycerides: 133 mg/dL (ref <150)
VLDL: 27 mg/dL (ref 0–40)

## 2018-01-04 LAB — CBC WITH DIFFERENTIAL/PLATELET
BASOS ABS: 0 10*3/uL (ref 0.0–0.1)
Basophils Relative: 0 %
EOS ABS: 0 10*3/uL (ref 0.0–0.7)
EOS PCT: 0 %
HCT: 36.4 % — ABNORMAL LOW (ref 39.0–52.0)
Hemoglobin: 12.5 g/dL — ABNORMAL LOW (ref 13.0–17.0)
LYMPHS ABS: 1.7 10*3/uL (ref 0.7–4.0)
Lymphocytes Relative: 19 %
MCH: 30.1 pg (ref 26.0–34.0)
MCHC: 34.3 g/dL (ref 30.0–36.0)
MCV: 87.7 fL (ref 78.0–100.0)
MONO ABS: 0.7 10*3/uL (ref 0.1–1.0)
Monocytes Relative: 8 %
Neutro Abs: 6.2 10*3/uL (ref 1.7–7.7)
Neutrophils Relative %: 73 %
PLATELETS: 151 10*3/uL (ref 150–400)
RBC: 4.15 MIL/uL — ABNORMAL LOW (ref 4.22–5.81)
RDW: 14.4 % (ref 11.5–15.5)
WBC: 8.6 10*3/uL (ref 4.0–10.5)

## 2018-01-04 LAB — HEPATIC FUNCTION PANEL
ALT: 32 U/L (ref 17–63)
AST: 59 U/L — AB (ref 15–41)
Albumin: 3.3 g/dL — ABNORMAL LOW (ref 3.5–5.0)
Alkaline Phosphatase: 54 U/L (ref 38–126)
Bilirubin, Direct: 0.3 mg/dL (ref 0.1–0.5)
Indirect Bilirubin: 1.2 mg/dL — ABNORMAL HIGH (ref 0.3–0.9)
TOTAL PROTEIN: 7.2 g/dL (ref 6.5–8.1)
Total Bilirubin: 1.5 mg/dL — ABNORMAL HIGH (ref 0.3–1.2)

## 2018-01-04 LAB — BASIC METABOLIC PANEL
Anion gap: 14 (ref 5–15)
BUN: 27 mg/dL — AB (ref 6–20)
CALCIUM: 9.2 mg/dL (ref 8.9–10.3)
CO2: 23 mmol/L (ref 22–32)
CREATININE: 1.5 mg/dL — AB (ref 0.61–1.24)
Chloride: 102 mmol/L (ref 101–111)
GFR calc non Af Amer: 44 mL/min — ABNORMAL LOW (ref >60)
GFR, EST AFRICAN AMERICAN: 52 mL/min — AB (ref >60)
Glucose, Bld: 127 mg/dL — ABNORMAL HIGH (ref 65–99)
Potassium: 3.1 mmol/L — ABNORMAL LOW (ref 3.5–5.1)
SODIUM: 139 mmol/L (ref 135–145)

## 2018-01-04 LAB — TROPONIN I: TROPONIN I: 11.26 ng/mL — AB (ref <0.03)

## 2018-01-04 LAB — HEMOGLOBIN A1C
HEMOGLOBIN A1C: 6.4 % — AB (ref 4.8–5.6)
Mean Plasma Glucose: 136.98 mg/dL

## 2018-01-04 LAB — GLUCOSE, CAPILLARY
GLUCOSE-CAPILLARY: 144 mg/dL — AB (ref 65–99)
Glucose-Capillary: 123 mg/dL — ABNORMAL HIGH (ref 65–99)
Glucose-Capillary: 107 mg/dL — ABNORMAL HIGH (ref 65–99)
Glucose-Capillary: 185 mg/dL — ABNORMAL HIGH (ref 65–99)

## 2018-01-04 LAB — MAGNESIUM: MAGNESIUM: 1.9 mg/dL (ref 1.7–2.4)

## 2018-01-04 LAB — HEPARIN LEVEL (UNFRACTIONATED)
Heparin Unfractionated: 0.27 IU/mL — ABNORMAL LOW (ref 0.30–0.70)
Heparin Unfractionated: 0.24 IU/mL — ABNORMAL LOW (ref 0.30–0.70)

## 2018-01-04 LAB — STREP PNEUMONIAE URINARY ANTIGEN: Strep Pneumo Urinary Antigen: NEGATIVE

## 2018-01-04 LAB — TSH: TSH: 1.139 u[IU]/mL (ref 0.350–4.500)

## 2018-01-04 LAB — AMMONIA: Ammonia: 19 umol/L (ref 9–35)

## 2018-01-04 MED ORDER — LORAZEPAM 1 MG PO TABS
2.0000 mg | ORAL_TABLET | Freq: Every evening | ORAL | Status: DC | PRN
  Administered 2018-01-05: 22:00:00 2 mg via ORAL
  Filled 2018-01-04: qty 2

## 2018-01-04 MED ORDER — CARVEDILOL 3.125 MG PO TABS
3.1250 mg | ORAL_TABLET | Freq: Two times a day (BID) | ORAL | Status: DC
  Administered 2018-01-04 – 2018-01-06 (×3): 3.125 mg via ORAL
  Filled 2018-01-04 (×3): qty 1

## 2018-01-04 MED ORDER — HEPARIN (PORCINE) IN NACL 100-0.45 UNIT/ML-% IJ SOLN
1500.0000 [IU]/h | INTRAMUSCULAR | Status: DC
  Administered 2018-01-04: 1400 [IU]/h via INTRAVENOUS
  Filled 2018-01-04: qty 250

## 2018-01-04 MED ORDER — POTASSIUM CHLORIDE CRYS ER 20 MEQ PO TBCR
40.0000 meq | EXTENDED_RELEASE_TABLET | Freq: Once | ORAL | Status: AC
  Administered 2018-01-04: 40 meq via ORAL
  Filled 2018-01-04: qty 2

## 2018-01-04 MED ORDER — HEPARIN (PORCINE) IN NACL 100-0.45 UNIT/ML-% IJ SOLN
1250.0000 [IU]/h | INTRAMUSCULAR | Status: DC
  Administered 2018-01-04: 1250 [IU]/h via INTRAVENOUS
  Filled 2018-01-04: qty 250

## 2018-01-04 NOTE — Progress Notes (Signed)
Pt seen, asleep, no respiratory distress/increased wob noted.  Pt on 2lnc, HR81, RR22, Spo2 95%.  Bipap not indicated at this time, but remains in room on standby.

## 2018-01-04 NOTE — Progress Notes (Signed)
[place] for Heparin Indication: chest pain/ACS  Allergies  Allergen Reactions  . Codeine Nausea And Vomiting  . Nicotine     Nicotine Patch--Per Nurse "heart issues"   Patient Measurements: Height: 6' (182.9 cm) Weight: 171 lb 1.2 oz (77.6 kg) IBW/kg (Calculated) : 77.6 Heparin Dosing Weight: 78 kg  Vital Signs: Temp: 99.3 F (37.4 C) (02/13 0800) Temp Source: Oral (02/13 0800) BP: 113/35 (02/13 0924) Pulse Rate: 76 (02/13 0924)  Labs: Recent Labs    01/02/18 1550 01/03/18 0410 01/03/18 1503 01/03/18 2255 01/04/18 0455 01/04/18 0843  HGB 13.9 12.3*  --   --  12.5*  --   HCT 39.9 36.0*  --   --  36.4*  --   PLT 194 160  --   --  151  --   LABPROT 15.4*  --   --   --   --   --   INR 1.23  --   --   --   --   --   HEPARINUNFRC  --   --   --  0.29*  --  0.27*  CREATININE 1.80* 1.41*  --   --  1.50*  --   TROPONINI  --   --  6.19* 12.75* 11.26*  --    Estimated Creatinine Clearance: 48.1 mL/min (A) (by C-G formula based on SCr of 1.5 mg/dL (H)).  Medical History: Past Medical History:  Diagnosis Date  . Agent orange exposure   . Anxiety   . Carotid artery disease ([place])    a. s/p carotid endarterectomy remotely.  . Chronic systolic CHF (congestive heart failure) ([place])   . COLONIC POLYPS, HX OF 01/29/2011   Qualifier: Diagnosis of  By: Jenny Reichmann MD, Hunt Oris   . Coronary artery disease involving native coronary artery with angina pectoris ([place])    a. s/p Multiple PCIs. b. CABG x 3 in 2013 (SVG-OM1, SVG-Diag with freeLIMA-LAD from SVG-Diag hood) --> relook cath in ~2015 with at least 1 graft ~occluded by report.  . Diabetes mellitus with circulatory complication ([place])   . ERECTILE DYSFUNCTION 08/15/2007   Qualifier: Diagnosis of  By: Dance CMA ([place]), Kim    . Essential hypertension   . Hyperlipidemia   . Insomnia   . LBBB (left bundle branch block)   . PTSD 07/30/2010   Qualifier: Diagnosis of  By: Jenny Reichmann MD, Hunt Oris   . PVD  (peripheral vascular disease) ([place])    a. s/p AOBF and LCE in the 1990s.   Medications:  Scheduled:  . aspirin EC  81 mg Oral QHS  . atorvastatin  40 mg Oral QPM  . carvedilol  3.125 mg Oral BID WC  . chlorhexidine  15 mL Mouth Rinse BID  . clopidogrel  75 mg Oral Daily  . famotidine  20 mg Oral BID  . insulin aspart  0-9 Units Subcutaneous TID WC  . mouth rinse  15 mL Mouth Rinse q12n4p  . nicotine  14 mg Transdermal Daily  . oseltamivir  30 mg Oral BID   Infusions:  . heparin     Assessment: 37 yoM to ED 2/11 with 2-3 day hx of flu - begun on Tamiflu as outpt, continues with fever. Patient c/o chest pain, asked wife for NTG tablet.  Noted SHOB, rapid response called, transfer to ICU, begin IV Heparin, r/o ACS/STEMI.  Serial Troponins elevated 6.19 > 12.75 > 11.26.  Cardiology consulted.  Today, 01/04/2018   Heparin level subtherapeutic (0.27) on  1100 units/hr  CBC: Hgb low but stable, Plts WNL  No bleeding reported per discussion with RN  Aspirin 81mg  daily noted  Goal of Therapy:  Heparin level 0.3-0.7 units/ml Monitor platelets by anticoagulation protocol: Yes   Plan:   Increase heparin infusion to 1250 units/hr  Check heparin level in 8hrs  Daily heparin level and CBC  Monitor for signs/symptoms of bleeding  Peggyann Juba, PharmD, BCPS Pager: [phone] 01/04/2018, 9:59 AM

## 2018-01-04 NOTE — Consult Note (Signed)
Cardiology Consultation:   Patient ID: FINNEUS KANESHIRO; [id number]; [birth year]   Admit date: 01/02/2018 Date of Consult: 01/04/2018  Primary Care Provider: Biagio Borg, MD [facility]  Chief Complaint: weakness  Patient Profile:   Tayte Mcwherter Aguilera is a 72 y.o. 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 Erlinda Hong.  History of Present Illness:   From a cardiology standpoint notes indicate [facility]. He reportedly had CABG @ [facility] in [place] in 2013 with SVG-OM1, SVG-Diag with free LIMA-LAD from SVG-Diag hood and had relook cath around 2015 with at least 1 graft occluded - this information is obtained from a consult note from Dr. Ellyn Hack in 2017 during which the patient was admitted for CHF and troponin peak was 0.09. Medical therapy was optimized and he was advised to f/u with his primary cardiologist. 2D echo during that admission had shown EF around 40%. The patient reports Dr. Charlann Boxer office no longer sees vets so he's been following at the [place] every 3 months. Some time in Spring of last year he reports having a stent done. He says this was at the base of his neck. He's not sure if it was a heart stent or carotid stent. He's not sure what prompted the stent, other than that they "found I needed it on a routine checkup." He does not recall where this was done.  He was admitted 2/11 with influenza. He had recently been diagnosed with the flu a few days ago but despite taking Tamiflu had had persistent fevers, along with worsening weakness, new confusion and diarrhea. In the ED he was found to be severely hypertensive to 76/59 with  leukocytosis, hypokalemia, acute kidney injury, and lactic acidosis. Due to dehydration he was treated with IV fluids and continued on Tamiflu. He has remained confused since admission which apparently is unlike him (no prior h/o dementia). He showed initial improvement but yesterday he developed sudden onset of acute respiratory distress, tachycardia (sinus), and hypertension up to 187/107. He had asked for a NTG right before this occurred. He was placed on bipap and was transferred urgently to the ICU. CXR showed interstitial edema compatible with mild heart failure. He was seen by critical care and given 60mg  IV Lasix and heparin in case of ischemic event. Troponin was not drawn on admission, but values cycled yesterday showed 6.19->12.75->11.26. Labs otherwise reveal Cr trend 1.8->1.4->1.5, hypokalemia down to 3.1, LDL 46, normal TSH, negative strep pneumo antigen, procalcitonin 0.62. 2D echo yesterday showed EF 20-25%, grade 2, mild MR, mild LAE. He reports significant UOP into his foley overnight and is now off oxygen and feeling much better.  He has not had any chest pain. He has been able to do usual ADLs without angina recently and enjoys working on his '55 [employer]. He was very surprised to hear he had a heart attack as it did not feel like prior events, although he does state he was so SOB that he thought he was going to die.  Past Medical History:  Diagnosis Date  . Agent orange exposure   . Anxiety   . Carotid artery  disease [facility])    a. s/p carotid endarterectomy remotely.  . Chronic systolic CHF (congestive heart failure) ([place])   . COLONIC POLYPS, HX OF 01/29/2011   Qualifier: Diagnosis of  By: Jenny Reichmann MD, Hunt Oris   . Coronary artery disease involving native coronary artery with angina pectoris ([place])    a. s/p Multiple PCIs. b. CABG x 3 in 2013 (SVG-OM1, SVG-Diag with freeLIMA-LAD from SVG-Diag hood) --> relook cath in ~2015 with at least 1 graft ~occluded by report.  . Diabetes mellitus with  circulatory complication ([place])   . ERECTILE DYSFUNCTION 08/15/2007   Qualifier: Diagnosis of  By: Dance CMA ([place]), Kim    . Essential hypertension   . Hyperlipidemia   . Insomnia   . LBBB (left bundle branch block)   . PTSD 07/30/2010   Qualifier: Diagnosis of  By: Jenny Reichmann MD, Hunt Oris   . PVD (peripheral vascular disease) ([place])    a. s/p AOBF and LCE in the 1990s.    Past Surgical History:  Procedure Laterality Date  . ABDOMINAL AORTIC ANEURYSM REPAIR  1996?   Done at [facility] by Dr. Donnetta Hutching  . CAROTID ENDARTERECTOMY Left    Dr. Volney American at [facility] in [place]   . CAROTID PTA/STENT INTERVENTION Left 03/24/2017   Procedure: Carotid PTA/Stent Intervention;  Surgeon: Serafina Mitchell, MD;  Location: [place] CV [facility];  Service: Cardiovascular;  Laterality: Left;  . CORONARY ARTERY BYPASS GRAFT  2013   @ [place], [place] ([place] was contaminated) -- SVG-Diag with freeLIMA-LAD sewn to SVG hood, SVG-OM1  . CORONARY STENT PLACEMENT  2007  . PROSTATE BIOPSY  2009     Inpatient Medications: Scheduled Meds: . aspirin EC  81 mg Oral QHS  . atorvastatin  40 mg Oral QPM  . chlorhexidine  15 mL Mouth Rinse BID  . clopidogrel  75 mg Oral Daily  . famotidine  20 mg Oral BID  . insulin aspart  0-9 Units Subcutaneous TID WC  . mouth rinse  15 mL Mouth Rinse q12n4p  . metoprolol tartrate  12.5 mg Oral BID  . nicotine  14 mg Transdermal Daily  . oseltamivir  30 mg Oral BID   Continuous Infusions: . heparin 1,100 Units/hr (01/03/18 2346)   PRN Meds: acetaminophen, albuterol, benzonatate, LORazepam, nitroGLYCERIN, ondansetron **OR** ondansetron (ZOFRAN) IV  Home Meds: Prior to Admission medications   Medication Sig Start Date End Date Taking? Authorizing Provider  acetaminophen (TYLENOL) 325 MG tablet Take 650 mg by mouth every 6 (six) hours as needed.   Yes [provider]  albuterol (PROVENTIL) (2.5 MG/3ML) 0.083% nebulizer solution Take 2.5 mg by nebulization  every 6 (six) hours as needed for wheezing or shortness of breath.   Yes [provider]  aspirin EC 81 MG tablet Take 1 tablet (81 mg total) by mouth daily. Patient taking differently: Take 81 mg by mouth at bedtime.  05/15/14  Yes Biagio Borg, MD  atorvastatin (LIPITOR) 80 MG tablet Take 40 mg by mouth every evening.    Yes [provider]  benzonatate (TESSALON) 100 MG capsule Take by mouth 3 (three) times daily as needed for cough.   Yes [provider]  cholecalciferol (VITAMIN D) 1000 units tablet Take 1,000 Units by mouth at bedtime.   Yes [provider]  clopidogrel (PLAVIX) 75 MG tablet Take 75 mg by mouth daily.   Yes [provider]  furosemide (LASIX) 20 MG tablet Take 20 mg by  mouth daily.   Yes [provider]  glipiZIDE (GLUCOTROL) 5 MG tablet Take 5 mg by mouth 2 (two) times daily before a meal. Take 30 minutes prior to meals.   Yes [provider]  isosorbide dinitrate (ISORDIL) 20 MG tablet Take 40 mg by mouth 3 (three) times daily.    Yes [provider]  lisinopril (PRINIVIL,ZESTRIL) 5 MG tablet Take 2.5 mg by mouth daily.   Yes [provider]  LORazepam (ATIVAN) 1 MG tablet TAKE 2 TABLETS BY MOUTH EVERY NIGHT AT BEDTIME AS NEEDED 01/02/18  Yes Biagio Borg, MD  metFORMIN (GLUCOPHAGE) 500 MG tablet Take 1 tablet (500 mg total) by mouth 2 (two) times daily with a meal. 03/26/17  Yes Virgina Jock A, PA-C  metoprolol tartrate (LOPRESSOR) 25 MG tablet Take 12.5 mg by mouth 2 (two) times daily.   Yes [provider]  nitroGLYCERIN (NITROSTAT) 0.4 MG SL tablet Place 0.4 mg under the tongue every 5 (five) minutes as needed for chest pain.   Yes [provider]  oseltamivir (TAMIFLU) 75 MG capsule Take 75 mg by mouth.   Yes [provider]  ranitidine (ZANTAC) 150 MG tablet Take 150 mg by mouth 2 (two) times daily as needed for heartburn.    [provider]     Allergies:    Allergies  Allergen Reactions  . Codeine Nausea And Vomiting  . Nicotine     Nicotine Patch--Per Nurse "heart issues"    Social History:   Social History   Socioeconomic History  . Marital status: Married    Spouse name: Not on file  . Number of children: Not on file  . Years of education: Not on file  . Highest education level: Not on file  Social Needs  . Financial resource strain: Not on file  . Food insecurity - worry: Not on file  . Food insecurity - inability: Not on file  . Transportation needs - medical: Not on file  . Transportation needs - non-medical: Not on file  Occupational History  . Not on file  Tobacco Use  . Smoking status: Current Every Day Smoker    Types: Cigarettes  . Smokeless tobacco: Never Used  . Tobacco comment: 4-8 cigarettes per day  Substance and Sexual Activity  . Alcohol use: Yes    Alcohol/week: 0.0 oz  . Drug use: No  . Sexual activity: Not on file  Other Topics Concern  . Not on file  Social History Narrative  . Not on file    Family History:   The patient's family history includes Hypertension in his father.  ROS:  Please see the history of present illness. No recent LEE, orthopnea, PND, syncope. all other ROS reviewed and negative.     Physical Exam/Data:   Vitals:   01/04/18 0200 01/04/18 0300 01/04/18 0306 01/04/18 0400  BP: (!) 111/49 (!) 108/45  (!) 117/44  Pulse: 62 (!) 59  60  Resp: (!) 23 19  (!) 22  Temp:   97.8 F (36.6 C)   TempSrc:   Oral   SpO2: 97% 96%  97%  Weight:   171 lb 1.2 oz (77.6 kg)   Height:        Intake/Output Summary (Last 24 hours) at 01/04/2018 0714 Last data filed at 01/04/2018 0400 Gross per 24 hour  Intake 1280.23 ml  Output 2117 ml  Net -836.77 ml   Filed Weights   01/02/18 2230 01/03/18 1545 01/04/18 0306  Weight:  172 lb 6.4 oz (78.2 kg) 170 lb 10.2 oz (77.4 kg) 171 lb 1.2 oz (77.6 kg)   Body mass index is 23.2 kg/m.  General: Well developed, well  nourished WM, in no acute distress. Head: Normocephalic, atraumatic, sclera non-icteric, no xanthomas, nares are without discharge.  Neck: Negative for carotid bruits. JVD not elevated. Lungs: Diffuse rhonchi with coarse BS, no wheezing or rales. Breathing is unlabored. Heart: RRR with S1 S2. No murmurs, rubs, or gallops appreciated. Abdomen: Soft, non-tender, non-distended with normoactive bowel sounds. No hepatomegaly. No rebound/guarding. No obvious abdominal masses. Msk:  Strength and tone appear normal for age. Extremities: No clubbing or cyanosis. No edema.  Distal pedal pulses are 2+ and equal bilaterally. Neuro: Alert and oriented X 3. No facial asymmetry. No focal deficit. Moves all extremities spontaneously. Psych:  Responds to questions appropriately with a normal affect.  EKG:  The EKG was personally reviewed and demonstrates Sinus tach 103bpm with LBBB  Relevant CV Studies: See above  Laboratory Data:  Chemistry Recent Labs  Lab 01/02/18 1550 2018-01-27 0410 01/04/18 0455  NA 135 139 139  K 2.9* 3.3* 3.1*  CL 100* 107 102  CO2 23 22 23   GLUCOSE 118* 74 127*  BUN 25* 24* 27*  CREATININE 1.80* 1.41* 1.50*  CALCIUM 9.3 8.6* 9.2  GFRNONAA 36* 48* 44*  GFRAA 41* 56* 52*  ANIONGAP 12 10 14     Recent Labs  Lab 01/02/18 1550  PROT 7.4  ALBUMIN 3.8  AST 89*  ALT 20  ALKPHOS 60  BILITOT 1.3*   Hematology Recent Labs  Lab 01/02/18 1550 2018/01/27 0410 01/04/18 0455  WBC 12.7* 9.2 8.6  RBC 4.59 4.07* 4.15*  HGB 13.9 12.3* 12.5*  HCT 39.9 36.0* 36.4*  MCV 86.9 88.5 87.7  MCH 30.3 30.2 30.1  MCHC 34.8 34.2 34.3  RDW 14.3 14.4 14.4  PLT 194 160 151   Cardiac Enzymes Recent Labs  Lab 01/27/18 1503 January 27, 2018 2255 01/04/18 0455  TROPONINI 6.19* 12.75* 11.26*   No results for input(s): TROPIPOC in the last 168 hours.  BNPNo results for input(s): BNP, PROBNP in the last 168 hours.  DDimer No results for input(s): DDIMER in the last 168  hours.  Radiology/Studies:  US Renal  Result Date: 01/27/2018 CLINICAL DATA:  Acute kidney injury. EXAM: RENAL / URINARY TRACT ULTRASOUND COMPLETE COMPARISON:  None. FINDINGS: Right Kidney: Length: 14.3 cm. Echogenicity within normal limits. No mass or hydronephrosis visualized. Left Kidney: Length: 11.8 cm. Slight diffuse thinning of the renal cortex as compared to the opposite side. Echogenicity within normal limits. No mass or hydronephrosis visualized. Bladder: Appears normal for degree of bladder distention. IMPRESSION: 1. No hydronephrosis. 2. Slight diffuse thinning of the left renal cortex, nonspecific. Electronically Signed   By: Lorriane Shire M.D.   On: 01-27-2018 10:27   Dg Chest Port 1 View  Result Date: January 27, 2018 CLINICAL DATA:  Respiratory compromise EXAM: PORTABLE CHEST 1 VIEW COMPARISON:  01/02/2018 FINDINGS: Postop CABG. Bilateral interstitial edema. No significant pleural effusion. Negative for focal infiltrate. Cardiac enlargement. IMPRESSION: Cardiac enlargement with interstitial edema compatible with mild heart failure. Electronically Signed   By: Franchot Gallo M.D.   On: 01/27/2018 14:22   Dg Chest Port 1 View  Result Date: 01/02/2018 CLINICAL DATA:  Fever, diagnosed with the flu 2 days ago, history coronary artery disease, hypertension, diabetes mellitus, smoker EXAM: PORTABLE CHEST 1 VIEW COMPARISON:  Portable exam 1558 hours compared to 01/25/2016 FINDINGS: Normal heart size post CABG. Atherosclerotic  calcification aorta. Mediastinal contours and pulmonary vascularity normal. Minimal chronic interstitial prominence stable. No acute infiltrate, pleural effusion or pneumothorax. Bones unremarkable. IMPRESSION: Post CABG. Minimal chronic interstitial prominence without acute infiltrate. Electronically Signed   By: Lavonia Dana M.D.   On: 01/02/2018 16:16    Assessment and Plan:   1. Influenza A with sepsis physiology upon admission with lactic acidosis, acute kidney injury,  leukocytosis - with rhonchi on exam today. Further management per IM/CCM.  2. Acute on chronic combined CHF - went into pulm edema yesterday, suspect due to fluid resuscitation that was necessary for his initial hypotension and dehydration. LVEF reduced compared to 2017. With elevated troponin, I would be concerned this represents an ischemic cardiomyopathy. Creatinine mildly elevated this AM so will hold off further diuresis until conferring with MD. His ACEI is on hold. Would consider transition to ARB and possibly Entresto when appropriate if BP will support it. Continue lopressor for now with an eye towards consolidating to Toprol instead. Given LBBB and cardiomyopathy, if EF remains persistently low, may need to visit idea of CRT-D in the future but this is obviously dependent on further workup and trajectory. Can also consider spironolactone down the road. With h/o PAD I will review with MD whether he thinks consideration for renal duplex given renal insufficiency and abrupt pulm edema would be helpful.  3. NSTEMI in the setting of CAD s/p prior CABG, PCIs - continue ASA, Plavix, BB, statin, heparin. Troponin peak 12, higher than expected than just demand ischemia. Anticipate need for cath but would allow him to recover at least another day from pulm perspective. Will review with Dr. Stanford Breed.  4. Essential HTN - follow with med adjustment. Now normotensive which may be limiting to HF med titration.  5. Hyperlipidemia - LDL 27. Continue statin.  6. PAD/carotid disease - followed at [place]. Denies recent claudication.  For questions or updates, please contact [place] Please consult [email or web address] for contact info under Cardiology/STEMI.    Signed, Charlie Pitter, PA-C  01/04/2018 7:14 AM As above, patient seen and examined.  Briefly he is a 72 year old male with past medical history of coronary artery disease status post coronary artery bypass and graft, ischemic cardiomyopathy, PTSD, diabetes  mellitus, peripheral vascular disease admitted with the flu who we are asked to evaluate for a non-ST elevation myocardial infarction.  Patient admitted with general malaise, fevers and diarrhea.  He was felt to be dehydrated at time of admission and was treated with IV fluids.  Yesterday he developed severe dyspnea.  He denies chest pain.  He was given IV Lasix and is much improved this morning.  Troponin elevated and cardiology asked to evaluate. Troponin is 12.75.  Creatinine 1.50.  Echocardiogram shows severely reduced LV function.  Mild mitral regurgitation and mild left atrial enlargement.  Electrocardiogram shows sinus rhythm with left bundle branch block.  1 non-ST elevation myocardial infarction-unclear whether hydration superimposed on ischemic cardiomyopathy caused CHF followed by demand ischemia versus ischemia mediated event.  Given elevation of troponin I would be concerned about the latter.  Continue aspirin, Plavix, beta-blocker, statin and heparin.  He will ultimately need cardiac catheterization but I would like for his renal function to improve as well as his influenza.  The risks and benefits of cardiac catheterization were discussed including myocardial infarction, CVA, death and worsening renal function and he agrees to proceed.  We will potentially proceed tomorrow versus Friday depending on his renal function.    2 ischemic cardiomyopathy-LV function  is reduced.  I will discontinue metoprolol and instead treat with carvedilol.  I will add an ARB following cardiac catheterization once it is clear renal function is stable.  We can consider Entresto later once volume status and blood pressure is stable.  Titrate medications as an outpatient.  Repeat echocardiogram in 12 weeks.  If ejection fraction less than 35% would need CRT-D.  3 acute systolic congestive heart failure-patient has improved following diuresis yesterday.  I will hold on further diuresis as I would like for his renal  function to improve prior to catheterization.  Will consider addition of Spironolactone in the future if renal function and potassium allow.  4 acute on chronic stage III kidney disease-follow renal function after catheterization.  No ventriculogram.  I will not hydrate prior to procedure given acute congestive heart failure.  5 influenza-management per primary care.  6 hyperlipidemia-continue statin.  Kirk Ruths, MD

## 2018-01-04 NOTE — Progress Notes (Signed)
PT Cancellation Note  Patient Details Name: Jesse Hansen MRN: [id number] DOB: 1946-08-10   Cancelled Treatment:     RN reports pt is not able to participate in Physical Therapy today due to medical.  Troponin 11.26   Rica Koyanagi  PTA [facility]  Acute  Rehab Pager      [phone]

## 2018-01-04 NOTE — Progress Notes (Addendum)
PULMONARY / CRITICAL CARE MEDICINE   Name: Jesse Hansen MRN: [id number] DOB: 1946/05/03    ADMISSION DATE:  01/02/2018 CONSULTATION DATE:  2/12  REFERRING MD:  Erlinda Hong  CHIEF COMPLAINT:  Acute hypoxic respiratory failure    HISTORY OF PRESENT ILLNESS:   This is a 72 year old male patient with a significant history of coronary artery disease with prior coronary artery bypass grafting, EF estimated at 40% with hypokinesis back in 2017, tobacco abuse, hypertension, diabetes, posttraumatic stress disorder.  Was diagnosed with influenza on 2/9, and placed on Tamiflu.  In spite of this he is continued to have persistent fevers, worsening weakness, diarrhea, urinary incontinence, and some confusion.  Due to this he presented to the emergency room on 2/11.  In the emergency room he was found to have a systolic blood pressure in the 60s creatinine of 1.8 (baseline 1), and chest x-ray with Mild interstitial prominence.  Of influenza, dehydration with hypovolemia, and acute kidney injury.  He was given IV hydration, continued on Tamiflu, and admitted to the telemetry unit given a history of coronary artery disease.  During a.m. rounds on 2/12 patient actually reported feeling better.  Shortly after 12 noon patient began to have increased anxiety, he was asking for his nitroglycerin tablets,And progressed to become more short of breath with worsening work of breathing and confusion.  At approximately 2:00 PM rapid response call was initiated as the patient was found to be in acute distress.  On arrival to the room he was confused, mottled, and hypoxic.  His systolic blood pressure was in the mid 200s.  He was placed on noninvasive positive pressure ventilation, given IV Lasix, and emergently transferred to the intensive care were pulmonary critical care consult was requested.  2/13:: the patient had a quiet evening and night. He denies chest pain. He uis not short of breath.  His 02 sat is in the high 90s. His  work of breathing is normal.  PAST MEDICAL HISTORY :  He  has a past medical history of Agent orange exposure, Anxiety, Carotid artery disease ([place]), Chronic systolic CHF (congestive heart failure) ([place]), COLONIC POLYPS, HX OF (01/29/2011), Coronary artery disease involving native coronary artery with angina pectoris ([place]), Diabetes mellitus with circulatory complication ([place]), ERECTILE DYSFUNCTION (08/15/2007), Essential hypertension, Hyperlipidemia, Insomnia, LBBB (left bundle branch block), PTSD (07/30/2010), and PVD (peripheral vascular disease) ([place]).  PAST SURGICAL HISTORY: He  has a past surgical history that includes Coronary stent placement (2007); Prostate biopsy (2009); Coronary artery bypass graft (2013); Carotid endarterectomy (Left); Abdominal aortic aneurysm repair (1996?); and CAROTID PTA/STENT INTERVENTION (Left, 03/24/2017).  Allergies  Allergen Reactions  . Codeine Nausea And Vomiting  . Nicotine     Nicotine Patch--Per Nurse "heart issues"    No current facility-administered medications on file prior to encounter.    Current Outpatient Medications on File Prior to Encounter  Medication Sig  . acetaminophen (TYLENOL) 325 MG tablet Take 650 mg by mouth every 6 (six) hours as needed.  Marland Kitchen albuterol (PROVENTIL) (2.5 MG/3ML) 0.083% nebulizer solution Take 2.5 mg by nebulization every 6 (six) hours as needed for wheezing or shortness of breath.  Marland Kitchen aspirin EC 81 MG tablet Take 1 tablet (81 mg total) by mouth daily. (Patient taking differently: Take 81 mg by mouth at bedtime. )  . atorvastatin (LIPITOR) 80 MG tablet Take 40 mg by mouth every evening.   . benzonatate (TESSALON) 100 MG capsule Take by mouth 3 (three) times daily as needed for cough.  Marland Kitchen  cholecalciferol (VITAMIN D) 1000 units tablet Take 1,000 Units by mouth at bedtime.  . clopidogrel (PLAVIX) 75 MG tablet Take 75 mg by mouth daily.  . furosemide (LASIX) 20 MG tablet Take 20 mg by mouth daily.  Marland Kitchen glipiZIDE (GLUCOTROL) 5 MG  tablet Take 5 mg by mouth 2 (two) times daily before a meal. Take 30 minutes prior to meals.  . isosorbide dinitrate (ISORDIL) 20 MG tablet Take 40 mg by mouth 3 (three) times daily.   Marland Kitchen lisinopril (PRINIVIL,ZESTRIL) 5 MG tablet Take 2.5 mg by mouth daily.  Marland Kitchen LORazepam (ATIVAN) 1 MG tablet TAKE 2 TABLETS BY MOUTH EVERY NIGHT AT BEDTIME AS NEEDED  . metFORMIN (GLUCOPHAGE) 500 MG tablet Take 1 tablet (500 mg total) by mouth 2 (two) times daily with a meal.  . metoprolol tartrate (LOPRESSOR) 25 MG tablet Take 12.5 mg by mouth 2 (two) times daily.  . nitroGLYCERIN (NITROSTAT) 0.4 MG SL tablet Place 0.4 mg under the tongue every 5 (five) minutes as needed for chest pain.  Marland Kitchen oseltamivir (TAMIFLU) 75 MG capsule Take 75 mg by mouth.  . ranitidine (ZANTAC) 150 MG tablet Take 150 mg by mouth 2 (two) times daily as needed for heartburn.    FAMILY HISTORY:  His indicated that the status of his father is unknown.   SOCIAL HISTORY: He  reports that he has been smoking cigarettes.  he has never used smokeless tobacco. He reports that he drinks alcohol. He reports that he does not use drugs.  REVIEW OF SYSTEMS:   Unable  SUBJECTIVE:  Feels a little better on BiPAP  VITAL SIGNS: BP (!) 117/44 (BP Location: Left Arm)   Pulse 60   Temp 97.8 F (36.6 C) (Oral)   Resp (!) 22   Ht 6' (1.829 m)   Wt 171 lb 1.2 oz (77.6 kg)   SpO2 97%   BMI 23.20 kg/m   HEMODYNAMICS:    VENTILATOR SETTINGS: FiO2 (%):  [40 %-80 %] 40 %  INTAKE / OUTPUT: I/O last 3 completed shifts: In: 1696.5 [P.O.:480; I.V.:1116.5; Other:100] Out: 2417 [Urine:2417]  PHYSICAL EXAMINATION: General: Acutely ill-appearing 72 year old white male currently on noninvasive positive pressure ventilation Neuro: He is awake, follows commands, is oriented to person only unable to tell me place or time there are no focal motor deficits HEENT: No obvious jugular venous distention currently has BiPAP mask in place Cardiovascular:  Tachycardic regular rhythm Lungs: Scattered rales and rhonchi, marked accessory use however this is improving some Abdomen: Soft, nontender, hypoactive Musculoskeletal: Equal strength and bulk Skin: Cool and mottled  LABS:  BMET Recent Labs  Lab 01/02/18 1550 01/03/18 0410 01/04/18 0455  NA 135 139 139  K 2.9* 3.3* 3.1*  CL 100* 107 102  CO2 23 22 23   BUN 25* 24* 27*  CREATININE 1.80* 1.41* 1.50*  GLUCOSE 118* 74 127*    Electrolytes Recent Labs  Lab 01/02/18 1550 01/03/18 0410 01/04/18 0455  CALCIUM 9.3 8.6* 9.2  MG  --  1.8 1.9    CBC Recent Labs  Lab 01/02/18 1550 01/03/18 0410 01/04/18 0455  WBC 12.7* 9.2 8.6  HGB 13.9 12.3* 12.5*  HCT 39.9 36.0* 36.4*  PLT 194 160 151    Coag's Recent Labs  Lab 01/02/18 1550  INR 1.23    Sepsis Markers Recent Labs  Lab 01/03/18 0028 01/03/18 0410 01/03/18 1452 01/03/18 1503  LATICACIDVEN 1.7 1.4 3.7*  --   PROCALCITON  --   --   --  0.62  ABG No results for input(s): PHART, PCO2ART, PO2ART in the last 168 hours.  Liver Enzymes Recent Labs  Lab 01/02/18 1550  AST 89*  ALT 20  ALKPHOS 60  BILITOT 1.3*  ALBUMIN 3.8    Cardiac Enzymes Recent Labs  Lab 01/03/18 1503 01/03/18 2255 01/04/18 0455  TROPONINI 6.19* 12.75* 11.26*    Glucose Recent Labs  Lab 01/03/18 0756 01/03/18 1100 01/03/18 1717 01/03/18 2106  GLUCAP 71 120* 163* 132*    Imaging US Renal  Result Date: 01/03/2018 CLINICAL DATA:  Acute kidney injury. EXAM: RENAL / URINARY TRACT ULTRASOUND COMPLETE COMPARISON:  None. FINDINGS: Right Kidney: Length: 14.3 cm. Echogenicity within normal limits. No mass or hydronephrosis visualized. Left Kidney: Length: 11.8 cm. Slight diffuse thinning of the renal cortex as compared to the opposite side. Echogenicity within normal limits. No mass or hydronephrosis visualized. Bladder: Appears normal for degree of bladder distention. IMPRESSION: 1. No hydronephrosis. 2. Slight diffuse thinning  of the left renal cortex, nonspecific. Electronically Signed   By: Lorriane Shire M.D.   On: 01/03/2018 10:27   Dg Chest Port 1 View  Result Date: 01/03/2018 CLINICAL DATA:  Respiratory compromise EXAM: PORTABLE CHEST 1 VIEW COMPARISON:  01/02/2018 FINDINGS: Postop CABG. Bilateral interstitial edema. No significant pleural effusion. Negative for focal infiltrate. Cardiac enlargement. IMPRESSION: Cardiac enlargement with interstitial edema compatible with mild heart failure. Electronically Signed   By: Franchot Gallo M.D.   On: 01/03/2018 14:22  ECG 2/12:: sinus tach, LAE LBBB. No major ST-T changes   STUDIES:  ECHO 2/12>>>  CULTURES: BCX2 2/12>>> ustrep 2/12>>>   ANTIBIOTICS: tamiflu 2/11>>>  SIGNIFICANT EVENTS:  LINES/TUBES:   DISCUSSION: Favor acute on chronic HF superimposed on influenza   ASSESSMENT / PLAN:  Acute hypoxic respiratory failure in the setting of progressive pulmonary infiltrates.  Suspect this represents pulmonary edema superimposed on underlying influenza infection.  Additionally could consider evolving lung injury versus less likely pulmonary emboli given the progression of infiltrates on chest x-ray Chest x-ray personally reviewed: This demonstrates worsening pulmonary infiltrates when compared to admission.  2/13:: At this point it appears the patient likely went into acute pulmonary edema after being fluid resuscitated on admission. The patient is off BIPAP and presently on a nasal cannula Repeat CXR is pending  High troponins It does  appear that the patient had an ischemic njury at the time he cam to the ICU. His troponin hit a peak of about 12 and is trending down since. He denies chest pain ECG did not reveal major changes but the patient has a LBBB. The patient is on a heparin drip. The patient needs to be evaluated by cardiology for p[ossible catheterization.  Hypokalemia Being replaced  Influenza A Patient is onTamiflu  Plan Continue  noninvasive positive pressure ventilation Agree with preload reduction via with diuresis, IV heparin (while we rule out cardiac ischemic event) Serial cardiac enzymes Echocardiogram Will need to reassess, hope to avoid intubation We will make him n.p.o. KVO IV fluids Continue Tamiflu, with plan to complete 5 days Check procalcitonin Check BNP Cont bb   History of coronary artery disease with systolic heart failure Acute on chronic heart failure Hypertensive crisis-flash pulmonary edema Plan Continue noninvasive positive pressure ventilation Continued IV diuresis Reassess blood pressure, may need more aggressive blood pressure control Continue aspirin and Plavix  Acute kidney injury.  Serum creatinine peaked at 1.8, now trending down Plan KVO IV fluids Renal dose medications Strict I&O Follow-up a.m. chemistry  Hypokalemia Plan Replace/recheck  Acute encephalopathy.  Suspect hypoxia related however sepsis also a possibility Plan Continue supportive care  Diabetes Plan Sliding scale insulin  Mild anemia w/out evidence of bleeding Plan Trend CBC  FAMILY  - Updates:   Erick Colace ACNP-BC [place] Pager # [phone] OR # [phone] if no answer     01/04/2018, 8:12 AM

## 2018-01-04 NOTE — Progress Notes (Signed)
PROGRESS NOTE  Jesse Hansen GUY:[id number] DOB: 1946-11-17 DOA: 01/02/2018 PCP: Biagio Borg, MD  Brief summary:  Patient was diagnosed with flu two days ago at outside ED, he is brought to ED at [facility] long due to fever 102 at home and acute onset of confusion. He has hypotension, sbp in the 70's initially  Labs and blood pressure Improving, but remain confused  However around 2pm on 2/12, He developed acute dyspnea, acute diffuse rhonchi on lung exam, acute skin mottling from chest to  Lower extremities. Prior to this episode , he asked for nitroglycerin. Wife reports patient has been popping nitroglycerin at home as well.  Ivf stopped, he is given iv lasix, he is started on heparin drip. He is started on bipap and transferred to icu. Critical care and cardiology consulted.   HPI/Recap of past 24 hours:  Doing better, now on 2liter nasal cannula 2.1liter urine output last 24hrs  confused elderly not oriented to time, know he is in the hospital, can not states the name He denies pain, no fever Wife at bedside, wife is concerned about the confusion.   Assessment/Plan: Principal Problem:   AKI (acute kidney injury) ([place]) Active Problems:   Hx of CABG x 3 2013   Tobacco abuse   Influenza   Lactic acidosis   Hypokalemia   Hypotension  Fever /+flu /dehydration /hypotension sbp in the 70's on presentation/aki /lactic acidosis( presenting symptom) -Had fever 102 at home, no fever since admission -blood pressure improved, renal function improved, lactic acidosis resolved on ivf, now off ivf due to acute respiratory distress on 2/12 pm -Continue tamiflu renal dosing  Acute respiratory distress/acute on chronic combined chf/NSTEMI/ CAD s/p CABG, chronic LBBB :  -Sudden onset of respiratory distress with acute diffuse rhonchi bilateral lung fields on 2/12 pm, prior to this episode , patient asked for prn nitroglycerin, wife report patient has been pop up nitroglycerin at  home -Differential including flush pulmonary edema/acs/PE, can not do cta due to impaired renal function, -He is put on bipap, transfer to icu -Stat abg no co2 retention. 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 On ssi here  Code Status: full  Family Communication: patient and wife  Disposition Plan: remain in stepdown/icu    Consultants:  Cardiology  Critical care  Procedures:  bipap  Antibiotics:  none   Objective: BP (!) 117/44 (BP Location: Left Arm)   Pulse 60   Temp 97.8 F (36.6 C) (Oral)   Resp (!) 22   Ht 6' (1.829 m)   Wt 77.6 kg (171 lb 1.2 oz)   SpO2 97%   BMI 23.20 kg/m   Intake/Output Summary (Last 24 hours) at 01/04/2018 0752 Last data filed at 01/04/2018 0400 Gross per 24 hour  Intake 1280.23 ml  Output 2117 ml  Net -836.77 ml   Filed Weights   01/02/18 2230 01/03/18 1545 01/04/18 0306  Weight: 78.2 kg (172 lb 6.4 oz) 77.4  kg (170 lb 10.2 oz) 77.6 kg (171 lb 1.2 oz)    Exam: Patient is examined daily including today on 01/04/2018, exams remain the same as of yesterday except that has changed    General:  Confused, no agitation, on 2liter oxygen, not in respiratory distress  Cardiovascular: RRR  Respiratory: diffuse rhonchi in all lung fields has resolved, good aeration today, no wheezing, no rhonchi, minimal bibasilar rales  Abdomen: Soft/ND/NT, positive BS  Musculoskeletal: No  Edema  Neuro: alert, hard of hearing, not oriented   Data Reviewed: Basic Metabolic Panel: Recent Labs  Lab 01/02/18 1550 01/03/18 0410 01/04/18 0455  NA 135 139 139  K 2.9* 3.3* 3.1*  CL 100* 107 102  CO2 23 22 23   GLUCOSE 118* 74 127*  BUN 25* 24* 27*  CREATININE 1.80* 1.41* 1.50*  CALCIUM 9.3 8.6* 9.2  MG  --  1.8 1.9   Liver Function Tests: Recent Labs  Lab 01/02/18 1550  AST 89*  ALT 20  ALKPHOS 60  BILITOT 1.3*  PROT 7.4  ALBUMIN 3.8   No results for input(s): LIPASE, AMYLASE in the last 168 hours. No results for input(s): AMMONIA in the last 168 hours. CBC: Recent Labs  Lab 01/02/18 1550 01/03/18 0410 01/04/18 0455  WBC 12.7* 9.2 8.6  NEUTROABS 9.6*  --  6.2  HGB 13.9 12.3* 12.5*  HCT 39.9 36.0* 36.4*  MCV 86.9 88.5 87.7  PLT 194 160 151   Cardiac Enzymes:   Recent Labs  Lab 01/03/18 1503 01/03/18 2255 01/04/18 0455  TROPONINI 6.19* 12.75* 11.26*   BNP (last 3 results) No results for input(s): BNP in the last 8760 hours.  ProBNP (last 3 results) No results for input(s): PROBNP in the last 8760 hours.  CBG: Recent Labs  Lab 01/03/18 0756 01/03/18 1100 01/03/18 1717 01/03/18 2106  GLUCAP 71 120* 163* 132*    Recent Results (from the past 240 hour(s))  Culture, blood (Routine x 2)     Status: None (Preliminary result)   Collection Time: 01/02/18  3:50 PM  Result Value Ref Range Status   Specimen Description   Final    BLOOD RIGHT ANTECUBITAL Performed at [facility], [address] [place], [place] [address]    Special Requests   Final    BOTTLES DRAWN AEROBIC AND ANAEROBIC Blood Culture adequate volume Performed at [facility], [address] [place], [place] [address]    Culture   Final    NO GROWTH < 24 HOURS Performed at [facility], [place] [address], [place], [place] [address]    Report Status PENDING  Incomplete  Culture, blood (Routine x 2)     Status: None (Preliminary result)    Collection Time: 01/02/18  3:50 PM  Result Value Ref Range Status   Specimen Description   Final    BLOOD BLOOD LEFT FOREARM Performed at [facility], [address] [place], [place] [address]    Special Requests   Final    BOTTLES DRAWN AEROBIC AND ANAEROBIC Blood Culture adequate volume Performed at [facility], [address] [place], [place] [address]    Culture   Final    NO GROWTH < 24 HOURS Performed at [facility], [place] [address] [place], [place] [address]    Report Status PENDING  Incomplete  MRSA PCR Screening     Status: None   Collection Time: 01/03/18  2:54  PM  Result Value Ref Range Status   MRSA by PCR NEGATIVE NEGATIVE Final    Comment:        The GeneXpert MRSA Assay (FDA approved for NASAL specimens only), is one component of a comprehensive MRSA colonization surveillance program. It is not intended to diagnose MRSA infection nor to guide or monitor treatment for MRSA infections. Performed at [facility], [place] [address], [place], [place] [address]      Studies: US Renal  Result Date: 01/03/2018 CLINICAL DATA:  Acute kidney injury. EXAM: RENAL / URINARY TRACT ULTRASOUND COMPLETE COMPARISON:  None. FINDINGS: Right Kidney: Length: 14.3 cm. Echogenicity within normal limits. No mass or hydronephrosis visualized. Left Kidney: Length: 11.8 cm. Slight diffuse thinning of the renal cortex as compared to the opposite side. Echogenicity within normal limits. No mass or hydronephrosis visualized. Bladder: Appears normal for degree of bladder distention. IMPRESSION: 1. No hydronephrosis. 2. Slight diffuse thinning of the left renal cortex, nonspecific. Electronically Signed   By: Lorriane Shire M.D.   On: 01/03/2018 10:27   Dg Chest Port 1 View  Result Date: 01/03/2018 CLINICAL DATA:  Respiratory compromise EXAM: PORTABLE CHEST 1 VIEW COMPARISON:  01/02/2018 FINDINGS: Postop CABG. Bilateral interstitial edema. No  significant pleural effusion. Negative for focal infiltrate. Cardiac enlargement. IMPRESSION: Cardiac enlargement with interstitial edema compatible with mild heart failure. Electronically Signed   By: Franchot Gallo M.D.   On: 01/03/2018 14:22    Scheduled Meds: . aspirin EC  81 mg Oral QHS  . atorvastatin  40 mg Oral QPM  . chlorhexidine  15 mL Mouth Rinse BID  . clopidogrel  75 mg Oral Daily  . famotidine  20 mg Oral BID  . insulin aspart  0-9 Units Subcutaneous TID WC  . mouth rinse  15 mL Mouth Rinse q12n4p  . metoprolol tartrate  12.5 mg Oral BID  . nicotine  14 mg Transdermal Daily  . oseltamivir  30 mg Oral BID    Continuous Infusions: . heparin 1,100 Units/hr (01/03/18 2346)   Time spent: >56mins  I have personally reviewed and interpreted on  01/04/2018 daily labs, tele strips, imagings as discussed above under date review session and assessment and plans.  I reviewed all nursing notes, pharmacy notes, consultant notes,  vitals, pertinent old records  I have discussed plan of care as described above with RN , patient and family on 01/04/2018   Florencia Reasons MD, PhD  [facility] Pager [phone]. If 7PM-7AM, please contact night-coverage at [email or web address], password [facility] 01/04/2018, 7:52 AM  LOS: 2 days

## 2018-01-04 NOTE — Progress Notes (Signed)
[place] for Heparin Indication: chest pain/ACS  Allergies  Allergen Reactions  . Codeine Nausea And Vomiting  . Nicotine     Nicotine Patch--Per Nurse "heart issues"   Patient Measurements: Height: 6' (182.9 cm) Weight: 171 lb 1.2 oz (77.6 kg) IBW/kg (Calculated) : 77.6 Heparin Dosing Weight: 78 kg  Vital Signs: Temp: 99.3 F (37.4 C) (02/13 1600) Temp Source: Oral (02/13 1600) BP: 114/59 (02/13 1700) Pulse Rate: 80 (02/13 1700)  Labs: Recent Labs    01/02/18 1550 01/03/18 0410 01/03/18 1503 01/03/18 2255 01/04/18 0455 01/04/18 0843 01/04/18 1744  HGB 13.9 12.3*  --   --  12.5*  --   --   HCT 39.9 36.0*  --   --  36.4*  --   --   PLT 194 160  --   --  151  --   --   LABPROT 15.4*  --   --   --   --   --   --   INR 1.23  --   --   --   --   --   --   HEPARINUNFRC  --   --   --  0.29*  --  0.27* 0.24*  CREATININE 1.80* 1.41*  --   --  1.50*  --   --   TROPONINI  --   --  6.19* 12.75* 11.26*  --   --    Estimated Creatinine Clearance: 48.1 mL/min (A) (by C-G formula based on SCr of 1.5 mg/dL (H)).  Medications:  Scheduled:  . aspirin EC  81 mg Oral QHS  . atorvastatin  40 mg Oral QPM  . carvedilol  3.125 mg Oral BID WC  . chlorhexidine  15 mL Mouth Rinse BID  . clopidogrel  75 mg Oral Daily  . famotidine  20 mg Oral BID  . insulin aspart  0-9 Units Subcutaneous TID WC  . mouth rinse  15 mL Mouth Rinse q12n4p  . nicotine  14 mg Transdermal Daily  . oseltamivir  30 mg Oral BID   Infusions:  . heparin 1,250 Units/hr (01/04/18 1020)   Assessment: 65 yoM to ED 2/11 with 2-3 day hx of flu - begun on Tamiflu as outpt, continues with fever. Patient c/o chest pain, asked wife for NTG tablet.  Noted SHOB, rapid response called, transfer to ICU, begin IV Heparin, r/o ACS/STEMI.  Serial Troponins elevated 6.19 > 12.75 > 11.26.  Cardiology consulted.  Today, 01/04/2018   Heparin level remains subtherapeutic (0.24) on 1250  units/hr, heparin level decreased despite rate increase  CBC: Hgb low but stable, Plts WNL  No bleeding reported per discussion with RN  Aspirin 81mg  daily noted  Goal of Therapy:  Heparin level 0.3-0.7 units/ml Monitor platelets by anticoagulation protocol: Yes   Plan:   Increase heparin infusion to 1400 units/hr  Check heparin level in 8hrs  Daily heparin level and CBC  Monitor for signs/symptoms of bleeding  Doreene Eland, PharmD, BCPS.   Pager: [phone] 01/04/2018 6:59 PM

## 2018-01-05 ENCOUNTER — Inpatient Hospital Stay (HOSPITAL_COMMUNITY)
Admission: EM | Disposition: A | Discharge status: Home Health | Payer: Self-pay | Source: Home / Self Care | Attending: Internal Medicine

## 2018-01-05 ENCOUNTER — Encounter (HOSPITAL_COMMUNITY): Payer: Self-pay | Admitting: *Deleted

## 2018-01-05 ENCOUNTER — Other Ambulatory Visit: Payer: Self-pay

## 2018-01-05 DIAGNOSIS — E876 Hypokalemia: Secondary | ICD-10-CM

## 2018-01-05 DIAGNOSIS — I214 Non-ST elevation (NSTEMI) myocardial infarction: Secondary | ICD-10-CM

## 2018-01-05 HISTORY — PX: LEFT HEART CATH AND CORS/GRAFTS ANGIOGRAPHY: CATH118250

## 2018-01-05 LAB — CBC
HCT: 35.3 % — ABNORMAL LOW (ref 39.0–52.0)
HCT: 38 % — ABNORMAL LOW (ref 39.0–52.0)
Hemoglobin: 12 g/dL — ABNORMAL LOW (ref 13.0–17.0)
Hemoglobin: 12.9 g/dL — ABNORMAL LOW (ref 13.0–17.0)
MCH: 29.9 pg (ref 26.0–34.0)
MCH: 30.3 pg (ref 26.0–34.0)
MCHC: 34 g/dL (ref 30.0–36.0)
MCHC: 33.9 g/dL (ref 30.0–36.0)
MCV: 87.8 fL (ref 78.0–100.0)
MCV: 89.2 fL (ref 78.0–100.0)
PLATELETS: 161 10*3/uL (ref 150–400)
PLATELETS: 168 10*3/uL (ref 150–400)
RBC: 4.02 MIL/uL — AB (ref 4.22–5.81)
RBC: 4.26 MIL/uL (ref 4.22–5.81)
RDW: 14.2 % (ref 11.5–15.5)
RDW: 14.2 % (ref 11.5–15.5)
WBC: 8.2 10*3/uL (ref 4.0–10.5)
WBC: 6.8 10*3/uL (ref 4.0–10.5)

## 2018-01-05 LAB — BASIC METABOLIC PANEL
ANION GAP: 12 (ref 5–15)
BUN: 26 mg/dL — ABNORMAL HIGH (ref 6–20)
CALCIUM: 8.8 mg/dL — AB (ref 8.9–10.3)
CO2: 23 mmol/L (ref 22–32)
Chloride: 105 mmol/L (ref 101–111)
Creatinine, Ser: 1.32 mg/dL — ABNORMAL HIGH (ref 0.61–1.24)
GFR calc Af Amer: >60 mL/min (ref >60)
GFR, EST NON AFRICAN AMERICAN: 52 mL/min — AB (ref >60)
GLUCOSE: 157 mg/dL — AB (ref 65–99)
POTASSIUM: 3 mmol/L — AB (ref 3.5–5.1)
SODIUM: 140 mmol/L (ref 135–145)

## 2018-01-05 LAB — URINE CULTURE

## 2018-01-05 LAB — HEPATIC FUNCTION PANEL
ALBUMIN: 3.3 g/dL — AB (ref 3.5–5.0)
ALT: 48 U/L (ref 17–63)
AST: 67 U/L — AB (ref 15–41)
Alkaline Phosphatase: 55 U/L (ref 38–126)
BILIRUBIN DIRECT: 0.3 mg/dL (ref 0.1–0.5)
BILIRUBIN TOTAL: 1.4 mg/dL — AB (ref 0.3–1.2)
Indirect Bilirubin: 1.1 mg/dL — ABNORMAL HIGH (ref 0.3–0.9)
Total Protein: 7.1 g/dL (ref 6.5–8.1)

## 2018-01-05 LAB — GLUCOSE, CAPILLARY
GLUCOSE-CAPILLARY: 131 mg/dL — AB (ref 65–99)
GLUCOSE-CAPILLARY: 131 mg/dL — AB (ref 65–99)
GLUCOSE-CAPILLARY: 142 mg/dL — AB (ref 65–99)
Glucose-Capillary: 145 mg/dL — ABNORMAL HIGH (ref 65–99)
Glucose-Capillary: 142 mg/dL — ABNORMAL HIGH (ref 65–99)

## 2018-01-05 LAB — PROTIME-INR
INR: 1.16
PROTHROMBIN TIME: 14.7 s (ref 11.4–15.2)

## 2018-01-05 LAB — CREATININE, SERUM
Creatinine, Ser: 1.52 mg/dL — ABNORMAL HIGH (ref 0.61–1.24)
GFR calc Af Amer: 51 mL/min — ABNORMAL LOW (ref >60)
GFR, EST NON AFRICAN AMERICAN: 44 mL/min — AB (ref >60)

## 2018-01-05 LAB — POCT ACTIVATED CLOTTING TIME: ACTIVATED CLOTTING TIME: 125 s

## 2018-01-05 LAB — HEPARIN LEVEL (UNFRACTIONATED): HEPARIN UNFRACTIONATED: 0.29 [IU]/mL — AB (ref 0.30–0.70)

## 2018-01-05 SURGERY — LEFT HEART CATH AND CORS/GRAFTS ANGIOGRAPHY
Anesthesia: LOCAL

## 2018-01-05 MED ORDER — HEPARIN SODIUM (PORCINE) 5000 UNIT/ML IJ SOLN
5000.0000 [IU] | Freq: Three times a day (TID) | INTRAMUSCULAR | Status: DC
  Administered 2018-01-06: 06:00:00 5000 [IU] via SUBCUTANEOUS
  Filled 2018-01-05: qty 1

## 2018-01-05 MED ORDER — IOPAMIDOL (ISOVUE-370) INJECTION 76%
INTRAVENOUS | Status: DC | PRN
  Administered 2018-01-05: 100 mL via INTRA_ARTERIAL

## 2018-01-05 MED ORDER — SODIUM CHLORIDE 0.9 % IV SOLN
INTRAVENOUS | Status: DC
  Administered 2018-01-05: 15:00:00 via INTRAVENOUS

## 2018-01-05 MED ORDER — ASPIRIN 81 MG PO CHEW
81.0000 mg | CHEWABLE_TABLET | Freq: Once | ORAL | Status: AC
  Administered 2018-01-05: 81 mg via ORAL
  Filled 2018-01-05: qty 1

## 2018-01-05 MED ORDER — SODIUM CHLORIDE 0.9% FLUSH
3.0000 mL | Freq: Two times a day (BID) | INTRAVENOUS | Status: DC
  Administered 2018-01-05 – 2018-01-06 (×2): 3 mL via INTRAVENOUS

## 2018-01-05 MED ORDER — SODIUM CHLORIDE 0.9 % IV SOLN: INTRAVENOUS | Status: DC

## 2018-01-05 MED ORDER — LIDOCAINE HCL (PF) 1 % IJ SOLN
INTRAMUSCULAR | Status: AC
  Filled 2018-01-05: qty 30

## 2018-01-05 MED ORDER — IOPAMIDOL (ISOVUE-370) INJECTION 76%
INTRAVENOUS | Status: AC
  Filled 2018-01-05: qty 150

## 2018-01-05 MED ORDER — SODIUM CHLORIDE 0.9% FLUSH: 3.0000 mL | INTRAVENOUS | Status: DC | PRN

## 2018-01-05 MED ORDER — HEPARIN (PORCINE) IN NACL 2-0.9 UNIT/ML-% IJ SOLN
INTRAMUSCULAR | Status: AC | PRN
  Administered 2018-01-05 (×2): 500 mL via INTRA_ARTERIAL

## 2018-01-05 MED ORDER — HEPARIN (PORCINE) IN NACL 2-0.9 UNIT/ML-% IJ SOLN
INTRAMUSCULAR | Status: AC
  Filled 2018-01-05: qty 500

## 2018-01-05 MED ORDER — FENTANYL CITRATE (PF) 100 MCG/2ML IJ SOLN
INTRAMUSCULAR | Status: DC | PRN
  Administered 2018-01-05: 25 ug via INTRAVENOUS

## 2018-01-05 MED ORDER — SODIUM CHLORIDE 0.9 % IV SOLN: 250.0000 mL | INTRAVENOUS | Status: DC | PRN

## 2018-01-05 MED ORDER — LIDOCAINE HCL (PF) 1 % IJ SOLN
INTRAMUSCULAR | Status: DC | PRN
  Administered 2018-01-05: 12 mL via INTRADERMAL
  Administered 2018-01-05: 8 mL via INTRADERMAL

## 2018-01-05 MED ORDER — POTASSIUM CHLORIDE CRYS ER 20 MEQ PO TBCR
40.0000 meq | EXTENDED_RELEASE_TABLET | Freq: Every day | ORAL | Status: DC
  Administered 2018-01-06: 40 meq via ORAL
  Filled 2018-01-05: qty 2

## 2018-01-05 MED ORDER — SODIUM CHLORIDE 0.9% FLUSH: 3.0000 mL | Freq: Two times a day (BID) | INTRAVENOUS | Status: DC

## 2018-01-05 MED ORDER — SODIUM CHLORIDE 0.9 % WEIGHT BASED INFUSION: 1.0000 mL/kg/h | INTRAVENOUS | Status: AC

## 2018-01-05 MED ORDER — HEART ATTACK BOUNCING BOOK
Freq: Once | Status: AC
  Administered 2018-01-05: 22:00:00
  Filled 2018-01-05: qty 1

## 2018-01-05 MED ORDER — MIDAZOLAM HCL 2 MG/2ML IJ SOLN
INTRAMUSCULAR | Status: DC | PRN
  Administered 2018-01-05: 1 mg via INTRAVENOUS

## 2018-01-05 MED ORDER — MIDAZOLAM HCL 2 MG/2ML IJ SOLN
INTRAMUSCULAR | Status: AC
  Filled 2018-01-05: qty 2

## 2018-01-05 MED ORDER — FENTANYL CITRATE (PF) 100 MCG/2ML IJ SOLN
INTRAMUSCULAR | Status: AC
  Filled 2018-01-05: qty 2

## 2018-01-05 MED ORDER — POTASSIUM CHLORIDE CRYS ER 20 MEQ PO TBCR
40.0000 meq | EXTENDED_RELEASE_TABLET | ORAL | Status: AC
  Administered 2018-01-05: 40 meq via ORAL
  Filled 2018-01-05: qty 2

## 2018-01-05 SURGICAL SUPPLY — 10 items
CATH INFINITI 5FR AL1 (CATHETERS) ×1 IMPLANT
CATH INFINITI 5FR MULTPACK ANG (CATHETERS) ×1 IMPLANT
COVER PRB 48X5XTLSCP FOLD TPE (BAG) IMPLANT
COVER PROBE 5X48 (BAG) ×2
KIT HEART LEFT (KITS) ×2 IMPLANT
PACK CARDIAC CATHETERIZATION (CUSTOM PROCEDURE TRAY) ×2 IMPLANT
SHEATH PINNACLE 5F 10CM (SHEATH) ×2 IMPLANT
TRANSDUCER W/STOPCOCK (MISCELLANEOUS) ×2 IMPLANT
WIRE EMERALD 3MM-J .035X150CM (WIRE) ×1 IMPLANT
WIRE HI TORQ VERSACORE-J 145CM (WIRE) ×2 IMPLANT

## 2018-01-05 NOTE — H&P (View-Only) (Signed)
Progress Note  Patient Name: Jesse Hansen Date of Encounter: 01/05/2018  Primary Cardiologist: Followed at [facility] /Dr. Ellyn Hack   Subjective   Pt doing well today. He is asking to get up and walk the halls. He denies chest pain or associated symptoms. Reports breathing has improved. Discussed possible cath tomorrow?   Inpatient Medications    Scheduled Meds: . aspirin EC  81 mg Oral QHS  . atorvastatin  40 mg Oral QPM  . carvedilol  3.125 mg Oral BID WC  . chlorhexidine  15 mL Mouth Rinse BID  . clopidogrel  75 mg Oral Daily  . famotidine  20 mg Oral BID  . insulin aspart  0-9 Units Subcutaneous TID WC  . mouth rinse  15 mL Mouth Rinse q12n4p  . nicotine  14 mg Transdermal Daily  . oseltamivir  30 mg Oral BID   Continuous Infusions: . heparin 1,500 Units/hr (01/05/18 0600)   PRN Meds: acetaminophen, albuterol, benzonatate, LORazepam, nitroGLYCERIN, ondansetron **OR** ondansetron (ZOFRAN) IV   Vital Signs    Vitals:   01/05/18 0600 01/05/18 0700 01/05/18 0706 01/05/18 0856  BP: (!) 136/54 (!) 114/58  (!) 121/59  Pulse: 65 68  72  Resp: 20 (!) 22    Temp:   (!) 97.5 F (36.4 C)   TempSrc:   Axillary   SpO2: 99% 95%    Weight:      Height:        Intake/Output Summary (Last 24 hours) at 01/05/2018 1007 Last data filed at 01/05/2018 0400 Gross per 24 hour  Intake 220.33 ml  Output 100 ml  Net 120.33 ml   Filed Weights   01/03/18 1545 01/04/18 0306 01/05/18 0305  Weight: 170 lb 10.2 oz (77.4 kg) 171 lb 1.2 oz (77.6 kg) 167 lb 5.3 oz (75.9 kg)    Physical Exam   General: Well developed, well nourished, NAD Skin: Warm, dry, intact  Head: Normocephalic, atraumatic, clear, moist mucus membranes. Neck: Negative for carotid bruits. No JVD Lungs: Diminished lung sounds throughout all fields. Mild upper lobe wheezes and rhonchi. Breathing is unlabored. [place] O2 Cardiovascular: RRR with S1 S2. No murmurs, rubs, or gallops Abdomen: Soft, non-tender, non-distended with  normoactive bowel sounds. No obvious abdominal masses. MSK: Strength and tone appear normal for age. 5/5 in all extremities Extremities: No edema. No clubbing or cyanosis. DP/PT pulses 2+ bilaterally Neuro: Alert and oriented. No focal deficits. No facial asymmetry. MAE spontaneously. Psych: Responds to questions appropriately with normal affect.    Labs    Chemistry Recent Labs  Lab 01/02/18 1550 01/03/18 0410 01/04/18 0455 01/04/18 1744 01/05/18 0526  NA 135 139 139  --  140  K 2.9* 3.3* 3.1*  --  3.0*  CL 100* 107 102  --  105  CO2 23 22 23   --  23  GLUCOSE 118* 74 127*  --  157*  BUN 25* 24* 27*  --  26*  CREATININE 1.80* 1.41* 1.50*  --  1.32*  CALCIUM 9.3 8.6* 9.2  --  8.8*  PROT 7.4  --   --  7.2 7.1  ALBUMIN 3.8  --   --  3.3* 3.3*  AST 89*  --   --  59* 67*  ALT 20  --   --  32 48  ALKPHOS 60  --   --  54 55  BILITOT 1.3*  --   --  1.5* 1.4*  GFRNONAA 36* 48* 44*  --  52*  GFRAA 41* 56* 52*  --  >60  ANIONGAP 12 10 14   --  12     Hematology Recent Labs  Lab 01/03/18 0410 01/04/18 0455 01/05/18 0526  WBC 9.2 8.6 8.2  RBC 4.07* 4.15* 4.02*  HGB 12.3* 12.5* 12.0*  HCT 36.0* 36.4* 35.3*  MCV 88.5 87.7 87.8  MCH 30.2 30.1 29.9  MCHC 34.2 34.3 34.0  RDW 14.4 14.4 14.2  PLT 160 151 161    Cardiac Enzymes Recent Labs  Lab 01/03/18 1503 01/03/18 2255 01/04/18 0455  TROPONINI 6.19* 12.75* 11.26*   No results for input(s): TROPIPOC in the last 168 hours.   BNPNo results for input(s): BNP, PROBNP in the last 168 hours.   DDimer No results for input(s): DDIMER in the last 168 hours.   Radiology    Ct Head Wo Contrast  Result Date: 01/04/2018 CLINICAL DATA:  Altered level of consciousness (LOC), unexplained EXAM: CT HEAD WITHOUT CONTRAST TECHNIQUE: Contiguous axial images were obtained from the base of the skull through the vertex without intravenous contrast. COMPARISON:  None. FINDINGS: Brain: No acute intracranial hemorrhage. No focal mass lesion. No  CT evidence of acute infarction. No midline shift or mass effect. No hydrocephalus. Basilar cisterns are patent. There are periventricular and subcortical white matter hypodensities. Generalized cortical atrophy. Vascular: No hyperdense vessel or unexpected calcification. Skull: Normal. Negative for fracture or focal lesion. Sinuses/Orbits: Mucosal thickening in the LEFT maxillary sinus with fluid level. Frontal sinuses are clear Other: None IMPRESSION: 1. No acute intracranial findings. 2. Mild atrophy and white matter disease. 3. LEFT maxillary sinusitis. Electronically Signed   By: Suzy Bouchard M.D.   On: 01/04/2018 20:50   US Renal  Result Date: 01/03/2018 CLINICAL DATA:  Acute kidney injury. EXAM: RENAL / URINARY TRACT ULTRASOUND COMPLETE COMPARISON:  None. FINDINGS: Right Kidney: Length: 14.3 cm. Echogenicity within normal limits. No mass or hydronephrosis visualized. Left Kidney: Length: 11.8 cm. Slight diffuse thinning of the renal cortex as compared to the opposite side. Echogenicity within normal limits. No mass or hydronephrosis visualized. Bladder: Appears normal for degree of bladder distention. IMPRESSION: 1. No hydronephrosis. 2. Slight diffuse thinning of the left renal cortex, nonspecific. Electronically Signed   By: Lorriane Shire M.D.   On: 01/03/2018 10:27   Dg Chest Port 1 View  Result Date: 01/04/2018 CLINICAL DATA:  Congestive failure and weakness EXAM: PORTABLE CHEST 1 VIEW COMPARISON:  01/03/2018 FINDINGS: Cardiac shadow is stable. Postoperative changes are again seen. Aortic calcifications are noted. No focal infiltrate or sizable effusion is seen. No bony abnormality is noted. IMPRESSION: No acute abnormality noted. Electronically Signed   By: Inez Catalina M.D.   On: 01/04/2018 09:15   Dg Chest Port 1 View  Result Date: 01/03/2018 CLINICAL DATA:  Respiratory compromise EXAM: PORTABLE CHEST 1 VIEW COMPARISON:  01/02/2018 FINDINGS: Postop CABG. Bilateral interstitial edema.  No significant pleural effusion. Negative for focal infiltrate. Cardiac enlargement. IMPRESSION: Cardiac enlargement with interstitial edema compatible with mild heart failure. Electronically Signed   By: Franchot Gallo M.D.   On: 01/03/2018 14:22    Telemetry    01/05/18 NSR LBBB HR 64 - Personally Reviewed  ECG     01/03/18 NSR/NST with LBBB HR 109- Personally Reviewed  Cardiac Studies   Echo: 01/03/18 Study Conclusions  - Left ventricle: The cavity size was normal. Wall thickness was   normal. Systolic function was severely reduced. The estimated   ejection fraction was in the range of 20% to 25%. Diffuse  hypokinesis. Features are consistent with a pseudonormal left   ventricular filling pattern, with concomitant abnormal relaxation   and increased filling pressure (grade 2 diastolic dysfunction). - Mitral valve: Calcified annulus. There was mild regurgitation. - Left atrium: The atrium was mildly dilated.  Patient Profile     72 y.o. 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 Erlinda Hong.  Assessment & Plan    1. Non-ST elevation MI: -Denies chest pain, breathing improved  -Trop trend, 12.75>11.26, will continue to trend -Hep gtt, ASA, plavix -Consider cath once renal function and influenza symptoms improved. Will discuss with MD>> today versus tomorrow?  2. Ischemic Cardiomyopathy: -EF 25% per echo 01/03/18 -Coreg 3.125mg  PO BID added 01/04/18 -Will add ARB once cath is complete and renal function is stable.Titrate and adjust medications as OP -Repeat echo will need to be performed; if less than 35%, will need to be considered for CRT-D  3. Acute systolic congestive heart failure: -Lasix held 01/04/18 -No  acute s/s of fluid volume overload  -Weight, 167lb today>>down from 186 on admission  -I&O, net positive 52ml since admission -Received fluid resuscitation for hypotension and potential dehydration r/t influena  4. Acute on chronic kidney disease stage III: -Improving, Cr, 1.32 today. 1.80>1.41>1.50>1.32  -No V gram per MD when performing cath and no pre-hydration fluid   5. Hyperlipidemia: -Statin  6. Influenza: -Per primary -Afebrile  7. Hypokalemia: -Will give 3meq now and 40 in 3 hours -Recheck in AM  Signed, Kathyrn Drown NP-C [place] Pager: [phone] 01/05/2018, 10:07 AM    For questions or updates, please contact   Please consult [email or web address] for contact info under Cardiology/STEMI. As above, patient seen and examined.  He feels much better this morning.  He denies dyspnea and states his flu symptoms have almost resolved.  He did rule in for a non-ST elevation myocardial infarction.  He will need cardiac catheterization.  The risks and benefits including myocardial infarction, CVA and death discussed.  We also discussed the possibility of worsening renal function.  Patient agrees to proceed.  Continue aspirin, Plavix, beta-blocker, statin and heparin.  I will add an ARB after procedure once renal function stable.  Patient will need follow-up echocardiogram 12 weeks after medication titration and if ejection fraction less than 35% would need CRT-D.  Follow kidney function closely after procedure.  Kirk Ruths, MD

## 2018-01-05 NOTE — Progress Notes (Signed)
Site area: left fa sheath Site Prior to Removal:  Level 0 Pressure Applied For:  20 minutes Manual:   yes Patient Status During Pull:  stable Post Pull Site:  Level  0 Post Pull Instructions Given:  yes Post Pull Pulses Present: palpable Dressing Applied:  Gauze and tegaderm Bedrest begins @ 1700 Comments:

## 2018-01-05 NOTE — Interval H&P Note (Signed)
History and Physical Interval Note:  01/05/2018 3:04 PM  Jesse Hansen  has presented today for surgery, with the diagnosis of cp  The various methods of treatment have been discussed with the patient and family. After consideration of risks, benefits and other options for treatment, the patient has consented to  Procedure(s): LEFT HEART CATH AND CORS/GRAFTS ANGIOGRAPHY (N/A) as a surgical intervention .  The patient's history has been reviewed, patient examined, no change in status, stable for surgery.  I have reviewed the patient's chart and labs.  Questions were answered to the patient's satisfaction.    Cath Lab Visit (complete for each Cath Lab visit)  Clinical Evaluation Leading to the Procedure:   ACS: Yes.    Non-ACS:    Anginal Classification: CCS IV  Anti-ischemic medical therapy: No Therapy  Non-Invasive Test Results: No non-invasive testing performed  Prior CABG: Previous CABG       [name] 01/05/2018 3:04 PM

## 2018-01-05 NOTE — Progress Notes (Signed)
ANTICOAGULATION CONSULT NOTE - Follow Up Consult  Pharmacy Consult for Heparin Indication: chest pain/ACS  Allergies  Allergen Reactions  . Codeine Nausea And Vomiting  . Nicotine     Nicotine Patch--Per Nurse "heart issues"    Patient Measurements: Height: 6' (182.9 cm) Weight: 167 lb 5.3 oz (75.9 kg) IBW/kg (Calculated) : 77.6 Heparin Dosing Weight:   Vital Signs: Temp: 98.6 F (37 C) (02/14 0305) Temp Source: Oral (02/14 0305) BP: 115/55 (02/14 0500) Pulse Rate: 67 (02/14 0500)  Labs: Recent Labs    01/02/18 1550 01/03/18 0410 01/03/18 1503  01/03/18 2255 01/04/18 0455 01/04/18 0843 01/04/18 1744 01/05/18 0526  HGB 13.9 12.3*  --   --   --  12.5*  --   --  12.0*  HCT 39.9 36.0*  --   --   --  36.4*  --   --  35.3*  PLT 194 160  --   --   --  151  --   --  161  LABPROT 15.4*  --   --   --   --   --   --   --   --   INR 1.23  --   --   --   --   --   --   --   --   HEPARINUNFRC  --   --   --    < > 0.29*  --  0.27* 0.24* 0.29*  CREATININE 1.80* 1.41*  --   --   --  1.50*  --   --   --   TROPONINI  --   --  6.19*  --  12.75* 11.26*  --   --   --    < > = values in this interval not displayed.    Estimated Creatinine Clearance: 47.1 mL/min (A) (by C-G formula based on SCr of 1.5 mg/dL (H)).   Medications:  Infusions:  . heparin 1,400 Units/hr (01/04/18 2000)    Assessment: Patient with heparin level just below goal.  No heparin issues per RN.  Goal of Therapy:  Heparin level 0.3-0.7 units/ml Monitor platelets by anticoagulation protocol: Yes   Plan:  Increase heparin to 1500 units/hr Recheck level at [address], [place] 01/05/2018,6:30 AM

## 2018-01-05 NOTE — Progress Notes (Signed)
Site area: rt groin fa sheath Site Prior to Removal:  Level 0 Pressure Applied For: 20 minutes Manual:    yes Patient Status During Pull:  stable  Post Pull Site:  Level 0 Post Pull Instructions Given:  yes Post Pull Pulses Present: palpable Dressing Applied:  Gauze and tegaderm Bedrest begins @  Comments:

## 2018-01-05 NOTE — Progress Notes (Signed)
Progress Note  Patient Name: Jesse Hansen Date of Encounter: 01/05/2018  Primary Cardiologist: Followed at [facility] /Dr. Ellyn Hack   Subjective   Pt doing well today. He is asking to get up and walk the halls. He denies chest pain or associated symptoms. Reports breathing has improved. Discussed possible cath tomorrow?   Inpatient Medications    Scheduled Meds: . aspirin EC  81 mg Oral QHS  . atorvastatin  40 mg Oral QPM  . carvedilol  3.125 mg Oral BID WC  . chlorhexidine  15 mL Mouth Rinse BID  . clopidogrel  75 mg Oral Daily  . famotidine  20 mg Oral BID  . insulin aspart  0-9 Units Subcutaneous TID WC  . mouth rinse  15 mL Mouth Rinse q12n4p  . nicotine  14 mg Transdermal Daily  . oseltamivir  30 mg Oral BID   Continuous Infusions: . heparin 1,500 Units/hr (01/05/18 0600)   PRN Meds: acetaminophen, albuterol, benzonatate, LORazepam, nitroGLYCERIN, ondansetron **OR** ondansetron (ZOFRAN) IV   Vital Signs    Vitals:   01/05/18 0600 01/05/18 0700 01/05/18 0706 01/05/18 0856  BP: (!) 136/54 (!) 114/58  (!) 121/59  Pulse: 65 68  72  Resp: 20 (!) 22    Temp:   (!) 97.5 F (36.4 C)   TempSrc:   Axillary   SpO2: 99% 95%    Weight:      Height:        Intake/Output Summary (Last 24 hours) at 01/05/2018 1007 Last data filed at 01/05/2018 0400 Gross per 24 hour  Intake 220.33 ml  Output 100 ml  Net 120.33 ml   Filed Weights   01/03/18 1545 01/04/18 0306 01/05/18 0305  Weight: 170 lb 10.2 oz (77.4 kg) 171 lb 1.2 oz (77.6 kg) 167 lb 5.3 oz (75.9 kg)    Physical Exam   General: Well developed, well nourished, NAD Skin: Warm, dry, intact  Head: Normocephalic, atraumatic, clear, moist mucus membranes. Neck: Negative for carotid bruits. No JVD Lungs: Diminished lung sounds throughout all fields. Mild upper lobe wheezes and rhonchi. Breathing is unlabored. [place] O2 Cardiovascular: RRR with S1 S2. No murmurs, rubs, or gallops Abdomen: Soft, non-tender, non-distended with  normoactive bowel sounds. No obvious abdominal masses. MSK: Strength and tone appear normal for age. 5/5 in all extremities Extremities: No edema. No clubbing or cyanosis. DP/PT pulses 2+ bilaterally Neuro: Alert and oriented. No focal deficits. No facial asymmetry. MAE spontaneously. Psych: Responds to questions appropriately with normal affect.    Labs    Chemistry Recent Labs  Lab 01/02/18 1550 01/03/18 0410 01/04/18 0455 01/04/18 1744 01/05/18 0526  NA 135 139 139  --  140  K 2.9* 3.3* 3.1*  --  3.0*  CL 100* 107 102  --  105  CO2 23 22 23   --  23  GLUCOSE 118* 74 127*  --  157*  BUN 25* 24* 27*  --  26*  CREATININE 1.80* 1.41* 1.50*  --  1.32*  CALCIUM 9.3 8.6* 9.2  --  8.8*  PROT 7.4  --   --  7.2 7.1  ALBUMIN 3.8  --   --  3.3* 3.3*  AST 89*  --   --  59* 67*  ALT 20  --   --  32 48  ALKPHOS 60  --   --  54 55  BILITOT 1.3*  --   --  1.5* 1.4*  GFRNONAA 36* 48* 44*  --  52*  GFRAA 41* 56* 52*  --  >60  ANIONGAP 12 10 14   --  12     Hematology Recent Labs  Lab 01/03/18 0410 01/04/18 0455 01/05/18 0526  WBC 9.2 8.6 8.2  RBC 4.07* 4.15* 4.02*  HGB 12.3* 12.5* 12.0*  HCT 36.0* 36.4* 35.3*  MCV 88.5 87.7 87.8  MCH 30.2 30.1 29.9  MCHC 34.2 34.3 34.0  RDW 14.4 14.4 14.2  PLT 160 151 161    Cardiac Enzymes Recent Labs  Lab 01/03/18 1503 01/03/18 2255 01/04/18 0455  TROPONINI 6.19* 12.75* 11.26*   No results for input(s): TROPIPOC in the last 168 hours.   BNPNo results for input(s): BNP, PROBNP in the last 168 hours.   DDimer No results for input(s): DDIMER in the last 168 hours.   Radiology    Ct Head Wo Contrast  Result Date: 01/04/2018 CLINICAL DATA:  Altered level of consciousness (LOC), unexplained EXAM: CT HEAD WITHOUT CONTRAST TECHNIQUE: Contiguous axial images were obtained from the base of the skull through the vertex without intravenous contrast. COMPARISON:  None. FINDINGS: Brain: No acute intracranial hemorrhage. No focal mass lesion. No  CT evidence of acute infarction. No midline shift or mass effect. No hydrocephalus. Basilar cisterns are patent. There are periventricular and subcortical white matter hypodensities. Generalized cortical atrophy. Vascular: No hyperdense vessel or unexpected calcification. Skull: Normal. Negative for fracture or focal lesion. Sinuses/Orbits: Mucosal thickening in the LEFT maxillary sinus with fluid level. Frontal sinuses are clear Other: None IMPRESSION: 1. No acute intracranial findings. 2. Mild atrophy and white matter disease. 3. LEFT maxillary sinusitis. Electronically Signed   By: Suzy Bouchard M.D.   On: 01/04/2018 20:50   US Renal  Result Date: 01/03/2018 CLINICAL DATA:  Acute kidney injury. EXAM: RENAL / URINARY TRACT ULTRASOUND COMPLETE COMPARISON:  None. FINDINGS: Right Kidney: Length: 14.3 cm. Echogenicity within normal limits. No mass or hydronephrosis visualized. Left Kidney: Length: 11.8 cm. Slight diffuse thinning of the renal cortex as compared to the opposite side. Echogenicity within normal limits. No mass or hydronephrosis visualized. Bladder: Appears normal for degree of bladder distention. IMPRESSION: 1. No hydronephrosis. 2. Slight diffuse thinning of the left renal cortex, nonspecific. Electronically Signed   By: Lorriane Shire M.D.   On: 01/03/2018 10:27   Dg Chest Port 1 View  Result Date: 01/04/2018 CLINICAL DATA:  Congestive failure and weakness EXAM: PORTABLE CHEST 1 VIEW COMPARISON:  01/03/2018 FINDINGS: Cardiac shadow is stable. Postoperative changes are again seen. Aortic calcifications are noted. No focal infiltrate or sizable effusion is seen. No bony abnormality is noted. IMPRESSION: No acute abnormality noted. Electronically Signed   By: Inez Catalina M.D.   On: 01/04/2018 09:15   Dg Chest Port 1 View  Result Date: 01/03/2018 CLINICAL DATA:  Respiratory compromise EXAM: PORTABLE CHEST 1 VIEW COMPARISON:  01/02/2018 FINDINGS: Postop CABG. Bilateral interstitial edema.  No significant pleural effusion. Negative for focal infiltrate. Cardiac enlargement. IMPRESSION: Cardiac enlargement with interstitial edema compatible with mild heart failure. Electronically Signed   By: Franchot Gallo M.D.   On: 01/03/2018 14:22    Telemetry    01/05/18 NSR LBBB HR 64 - Personally Reviewed  ECG     01/03/18 NSR/NST with LBBB HR 109- Personally Reviewed  Cardiac Studies   Echo: 01/03/18 Study Conclusions  - Left ventricle: The cavity size was normal. Wall thickness was   normal. Systolic function was severely reduced. The estimated   ejection fraction was in the range of 20% to 25%. Diffuse  hypokinesis. Features are consistent with a pseudonormal left   ventricular filling pattern, with concomitant abnormal relaxation   and increased filling pressure (grade 2 diastolic dysfunction). - Mitral valve: Calcified annulus. There was mild regurgitation. - Left atrium: The atrium was mildly dilated.  Patient Profile     72 y.o. 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 Erlinda Hong.  Assessment & Plan    1. Non-ST elevation MI: -Denies chest pain, breathing improved  -Trop trend, 12.75>11.26, will continue to trend -Hep gtt, ASA, plavix -Consider cath once renal function and influenza symptoms improved. Will discuss with MD>> today versus tomorrow?  2. Ischemic Cardiomyopathy: -EF 25% per echo 01/03/18 -Coreg 3.125mg  PO BID added 01/04/18 -Will add ARB once cath is complete and renal function is stable.Titrate and adjust medications as OP -Repeat echo will need to be performed; if less than 35%, will need to be considered for CRT-D  3. Acute systolic congestive heart failure: -Lasix held 01/04/18 -No  acute s/s of fluid volume overload  -Weight, 167lb today>>down from 186 on admission  -I&O, net positive 535ml since admission -Received fluid resuscitation for hypotension and potential dehydration r/t influena  4. Acute on chronic kidney disease stage III: -Improving, Cr, 1.32 today. 1.80>1.41>1.50>1.32  -No V gram per MD when performing cath and no pre-hydration fluid   5. Hyperlipidemia: -Statin  6. Influenza: -Per primary -Afebrile  7. Hypokalemia: -Will give 22meq now and 40 in 3 hours -Recheck in AM  Signed, Kathyrn Drown NP-C [place] Pager: [phone] 01/05/2018, 10:07 AM    For questions or updates, please contact   Please consult [email or web address] for contact info under Cardiology/STEMI. As above, patient seen and examined.  He feels much better this morning.  He denies dyspnea and states his flu symptoms have almost resolved.  He did rule in for a non-ST elevation myocardial infarction.  He will need cardiac catheterization.  The risks and benefits including myocardial infarction, CVA and death discussed.  We also discussed the possibility of worsening renal function.  Patient agrees to proceed.  Continue aspirin, Plavix, beta-blocker, statin and heparin.  I will add an ARB after procedure once renal function stable.  Patient will need follow-up echocardiogram 12 weeks after medication titration and if ejection fraction less than 35% would need CRT-D.  Follow kidney function closely after procedure.  Kirk Ruths, MD

## 2018-01-05 NOTE — Progress Notes (Signed)
PROGRESS NOTE  Jesse Hansen VFI:[id number] DOB: 1945-12-01 DOA: 01/02/2018 PCP: Biagio Borg, MD  Brief summary:  Patient was diagnosed with flu two days ago at outside ED, he is brought to ED at [facility] due to fever 102 at home and acute onset of confusion. He has hypotension, sbp in the 70's initially  However around 2pm on 2/12, He developed acute dyspnea, acute diffuse rhonchi on lung exam, acute skin mottling from chest to  Lower extremities. Prior to this episode , he asked for nitroglycerin. Wife reports patient has been popping nitroglycerin at home as well.  Ivf stopped, he is given iv lasix, he is started on heparin drip. He is started on bipap and transferred to icu. Critical care and cardiology consulted.  Subjective  Pt states that he feels better.  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 Stat echo with reduced ef at 20%-25%, troponin peaked at 12.75 on 2/12 pm -s/p  lasix 60mg  x1 on 2/12, he is started on heparin drip on 2/12 -continue asa/plavix, statin, coreg -Cardiology consulted,  Likely cardiac cath in 24-48 hr pending renal function, cards to decide whether  or not to continue heparin gtt   Acute Metabolic encephalopathy: -Presented on admission -Wife reports this is acute, patient does not have h/o dementia, no confusion prior to flu diagnosis - suspect improvement today in condition - CT of head negative for cause   noninsulin dependent dm2, a1c 6.4 On ssi here  Code Status: full  Family Communication: patient and wife  Disposition Plan: remain in stepdown/icu    Consultants:  Cardiology  Critical care  Procedures:  bipap  Antibiotics:  none   Objective: BP (!) 121/59   Pulse 72   Temp (!) 97.5 F (36.4 C) (Axillary)   Resp (!) 22   Ht 6' (1.829 m)   Wt 75.9 kg (167 lb 5.3 oz)   SpO2 95%   BMI 22.69 kg/m   Intake/Output Summary (Last 24 hours) at 01/05/2018 1028 Last data filed at 01/05/2018 0400 Gross per 24 hour  Intake 220.33 ml  Output 100 ml  Net 120.33 ml   Filed Weights   01/03/18 1545 01/04/18 0306 01/05/18 0305  Weight: 77.4 kg (170 lb 10.2 oz) 77.6 kg (171 lb 1.2 oz) 75.9 kg (167 lb 5.3 oz)    Exam:   General:  Pt is alert and awake, in nad.  Cardiovascular: RRR, no cyanosis, no rubs  Respiratory: diffuse rhonchi in all lung fields has resolved, good aeration today, no wheezing, no rhonchi, minimal bibasilar rales  Abdomen: Soft/ND/NT, positive BS  Musculoskeletal: No Edema  Neuro: alert, hard of hearing, not oriented   Data Reviewed: Basic Metabolic Panel: Recent Labs  Lab  01/02/18 1550 01/03/18 0410 01/04/18 0455 01/05/18 0526  NA 135 139 139 140  K 2.9* 3.3* 3.1* 3.0*  CL 100* 107 102 105  CO2 23 22 23 23   GLUCOSE 118* 74 127* 157*  BUN 25* 24* 27* 26*  CREATININE 1.80* 1.41* 1.50* 1.32*  CALCIUM 9.3 8.6* 9.2 8.8*  MG  --  1.8 1.9  --    Liver Function Tests: Recent Labs  Lab 01/02/18 1550 01/04/18 1744 01/05/18 0526  AST 89* 59* 67*  ALT 20 32 48  ALKPHOS 60 54 55  BILITOT 1.3* 1.5* 1.4*  PROT 7.4 7.2 7.1  ALBUMIN 3.8 3.3* 3.3*   No results for input(s):  LIPASE, AMYLASE in the last 168 hours. Recent Labs  Lab 01/04/18 1744  AMMONIA 19   CBC: Recent Labs  Lab 01/02/18 1550 01/03/18 0410 01/04/18 0455 01/05/18 0526  WBC 12.7* 9.2 8.6 8.2  NEUTROABS 9.6*  --  6.2  --   HGB 13.9 12.3* 12.5* 12.0*  HCT 39.9 36.0* 36.4* 35.3*  MCV 86.9 88.5 87.7 87.8  PLT 194 160 151 161   Cardiac Enzymes:   Recent Labs  Lab 01/03/18 1503 01/03/18 2255 01/04/18 0455  TROPONINI 6.19* 12.75* 11.26*   BNP (last 3 results) No results for input(s): BNP in the last 8760 hours.  ProBNP (last 3 results) No results for input(s): PROBNP in the last 8760 hours.  CBG: Recent Labs  Lab 01/04/18 0855 01/04/18 1130 01/04/18 1557 01/04/18 2104 01/05/18 0731  GLUCAP 123* 107* 144* 185* 145*    Recent Results (from the past 240 hour(s))  Culture, blood (Routine x 2)     Status: None (Preliminary result)   Collection Time: 01/02/18  3:50 PM  Result Value Ref Range Status   Specimen Description   Final    BLOOD RIGHT ANTECUBITAL Performed at [facility], [address] [place], [place] [address]    Special Requests   Final    BOTTLES DRAWN AEROBIC AND ANAEROBIC Blood Culture adequate volume Performed at [facility], [place] [place], [place] [address]    Culture   Final    NO GROWTH 2 DAYS Performed at [facility], [place] [address] [place], [place] [address]    Report Status PENDING  Incomplete  Culture, blood (Routine x 2)     Status: None (Preliminary result)   Collection Time: 01/02/18  3:50 PM  Result Value Ref Range Status   Specimen Description   Final    BLOOD BLOOD LEFT FOREARM Performed at [facility], [address] [place], [place] [address]    Special Requests   Final    BOTTLES DRAWN AEROBIC AND ANAEROBIC Blood Culture adequate volume Performed at [facility], [address] [place], [place] [address]    Culture   Final    NO GROWTH 2 DAYS Performed at [facility], [place] [address] [place], [place] [address]    Report Status PENDING  Incomplete  Culture, Urine     Status: Abnormal   Collection Time: 01/03/18  2:14 PM  Result Value Ref Range Status   Specimen Description   Final    URINE, CLEAN CATCH Performed at [facility], [place] [address] [place], [place] [address]    Special Requests   Final    NONE Performed at [facility], [place] [address], [place], [place] [address]  Culture MULTIPLE SPECIES PRESENT, SUGGEST RECOLLECTION (A)  Final   Report Status 01/05/2018 FINAL  Final  MRSA PCR Screening     Status: None   Collection Time: 01/03/18  2:54 PM  Result Value Ref Range Status   MRSA by PCR NEGATIVE NEGATIVE Final    Comment:        The GeneXpert MRSA Assay (FDA approved for NASAL specimens only), is one component of a comprehensive MRSA colonization surveillance program. It is not intended to diagnose MRSA infection nor to guide or monitor treatment for MRSA infections. Performed at [facility], [place] [address] [place], [place] [address]      Studies: Ct Head Wo Contrast  Result Date: 01/04/2018 CLINICAL DATA:  Altered level of consciousness (LOC), unexplained EXAM: CT HEAD WITHOUT CONTRAST TECHNIQUE: Contiguous axial images were obtained from the base of the skull through the vertex without intravenous contrast. COMPARISON:  None. FINDINGS: Brain: No acute intracranial hemorrhage. No focal mass lesion. No CT evidence of acute infarction. No midline shift or mass effect. No hydrocephalus. Basilar cisterns are patent. There are periventricular and subcortical white matter hypodensities. Generalized cortical atrophy. Vascular: No hyperdense vessel or unexpected calcification. Skull: Normal. Negative for fracture or focal lesion. Sinuses/Orbits: Mucosal thickening in the LEFT maxillary sinus with fluid level. Frontal sinuses are clear Other: None IMPRESSION: 1. No  acute intracranial findings. 2. Mild atrophy and white matter disease. 3. LEFT maxillary sinusitis. Electronically Signed   By: Suzy Bouchard M.D.   On: 01/04/2018 20:50    Scheduled Meds: . aspirin EC  81 mg Oral QHS  . atorvastatin  40 mg Oral QPM  . carvedilol  3.125 mg Oral BID WC  . chlorhexidine  15 mL Mouth Rinse BID  . clopidogrel  75 mg Oral Daily  . famotidine  20 mg Oral BID  . insulin aspart  0-9 Units Subcutaneous TID WC  . mouth rinse  15 mL Mouth Rinse q12n4p  . nicotine  14 mg Transdermal Daily  . oseltamivir  30 mg Oral BID    Continuous Infusions: . heparin 1,500 Units/hr (01/05/18 0600)   Time spent: > 81mins  I have personally reviewed and interpreted on  01/05/2018 daily labs, tele strips, imagings as discussed above under date review session and assessment and plans.  I reviewed all nursing notes, pharmacy notes, consultant notes,  vitals, pertinent old records  I have discussed plan of care as described above with RN , patient and family on 01/05/2018   Velvet Bathe MD, PhD  [facility] Pager [phone]. If 7PM-7AM, please contact night-coverage at [email or web address], password [facility] 01/05/2018, 10:28 AM  LOS: 3 days

## 2018-01-06 ENCOUNTER — Encounter (HOSPITAL_COMMUNITY): Payer: Self-pay | Admitting: Cardiology

## 2018-01-06 DIAGNOSIS — I5023 Acute on chronic systolic (congestive) heart failure: Secondary | ICD-10-CM

## 2018-01-06 LAB — BASIC METABOLIC PANEL
ANION GAP: 14 (ref 5–15)
BUN: 24 mg/dL — AB (ref 6–20)
CHLORIDE: 105 mmol/L (ref 101–111)
CO2: 20 mmol/L — ABNORMAL LOW (ref 22–32)
Calcium: 8.7 mg/dL — ABNORMAL LOW (ref 8.9–10.3)
Creatinine, Ser: 1.37 mg/dL — ABNORMAL HIGH (ref 0.61–1.24)
GFR, EST AFRICAN AMERICAN: 57 mL/min — AB (ref >60)
GFR, EST NON AFRICAN AMERICAN: 50 mL/min — AB (ref >60)
Glucose, Bld: 162 mg/dL — ABNORMAL HIGH (ref 65–99)
POTASSIUM: 3.2 mmol/L — AB (ref 3.5–5.1)
SODIUM: 139 mmol/L (ref 135–145)

## 2018-01-06 LAB — CBC
HCT: 36.3 % — ABNORMAL LOW (ref 39.0–52.0)
Hemoglobin: 11.9 g/dL — ABNORMAL LOW (ref 13.0–17.0)
MCH: 29 pg (ref 26.0–34.0)
MCHC: 32.8 g/dL (ref 30.0–36.0)
MCV: 88.5 fL (ref 78.0–100.0)
PLATELETS: 164 10*3/uL (ref 150–400)
RBC: 4.1 MIL/uL — AB (ref 4.22–5.81)
RDW: 14.2 % (ref 11.5–15.5)
WBC: 7.4 10*3/uL (ref 4.0–10.5)

## 2018-01-06 LAB — GLUCOSE, CAPILLARY
GLUCOSE-CAPILLARY: 162 mg/dL — AB (ref 65–99)
GLUCOSE-CAPILLARY: 129 mg/dL — AB (ref 65–99)

## 2018-01-06 MED ORDER — POTASSIUM CHLORIDE ER 20 MEQ PO TBCR: 20.0000 meq | EXTENDED_RELEASE_TABLET | Freq: Every day | ORAL | 3 refills | Status: DC

## 2018-01-06 MED ORDER — LOSARTAN POTASSIUM 25 MG PO TABS: 25.0000 mg | ORAL_TABLET | Freq: Every day | ORAL | 4 refills | Status: DC

## 2018-01-06 MED ORDER — FUROSEMIDE 20 MG PO TABS
20.0000 mg | ORAL_TABLET | Freq: Every day | ORAL | Status: DC
  Administered 2018-01-06: 20 mg via ORAL
  Filled 2018-01-06: qty 1

## 2018-01-06 MED ORDER — FUROSEMIDE 20 MG PO TABS: 20.0000 mg | ORAL_TABLET | Freq: Every day | ORAL | 3 refills | Status: DC

## 2018-01-06 MED ORDER — LOSARTAN POTASSIUM 50 MG PO TABS
25.0000 mg | ORAL_TABLET | Freq: Every day | ORAL | Status: DC
  Administered 2018-01-06: 25 mg via ORAL
  Filled 2018-01-06: qty 1

## 2018-01-06 MED ORDER — CARVEDILOL 3.125 MG PO TABS: 3.1250 mg | ORAL_TABLET | Freq: Two times a day (BID) | ORAL | 3 refills | Status: DC

## 2018-01-06 MED ORDER — BENZONATATE 100 MG PO CAPS: 100.0000 mg | ORAL_CAPSULE | Freq: Three times a day (TID) | ORAL | 0 refills | Status: DC | PRN

## 2018-01-06 MED ORDER — POTASSIUM CHLORIDE CRYS ER 20 MEQ PO TBCR: 40.0000 meq | EXTENDED_RELEASE_TABLET | Freq: Once | ORAL | Status: DC

## 2018-01-06 MED ORDER — OSELTAMIVIR PHOSPHATE 30 MG PO CAPS: 30.0000 mg | ORAL_CAPSULE | Freq: Two times a day (BID) | ORAL | 0 refills | Status: AC

## 2018-01-06 MED FILL — Heparin Sodium (Porcine) 2 Unit/ML in Sodium Chloride 0.9%: INTRAMUSCULAR | Qty: 2000 | Status: AC

## 2018-01-06 NOTE — Discharge Summary (Signed)
Physician Discharge Summary   Patient ID: Jesse Hansen MRN: [id number] DOB/AGE: [birth year] 73 y.o.  Admit date: 01/02/2018 Discharge date: 01/06/2018  Primary Care Physician:  Biagio Borg, MD  Discharge Diagnoses:     Acute respiratory distress/acute hypoxic respiratory failure  Acute on chronic combined CHF   NSTEMI  Hypotension  Dehydration . AKI (acute kidney injury) ([place]) . Tobacco abuse . Influenza . Lactic acidosis . Hypokalemia . Hypotension Diabetes mellitus type 2  Consults: Cardiology  Recommendations for Outpatient Follow-up:  1. Continue Tamiflu to complete the course 2. Please repeat CBC/BMET at next visit   DIET: Carb modified diet    Allergies:   Allergies  Allergen Reactions  . Codeine Nausea And Vomiting  . Nicotine     Nicotine Patch--Per Nurse "heart issues"     DISCHARGE MEDICATIONS: Allergies as of 01/06/2018      Reactions   Codeine Nausea And Vomiting   Nicotine    Nicotine Patch--Per Nurse "heart issues"      Medication List    STOP taking these medications   isosorbide dinitrate 20 MG tablet Commonly known as:  ISORDIL   lisinopril 5 MG tablet Commonly known as:  PRINIVIL,ZESTRIL   metoprolol tartrate 25 MG tablet Commonly known as:  LOPRESSOR     TAKE these medications   acetaminophen 325 MG tablet Commonly known as:  TYLENOL Take 650 mg by mouth every 6 (six) hours as needed.   albuterol (2.5 MG/3ML) 0.083% nebulizer solution Commonly known as:  PROVENTIL Take 2.5 mg by nebulization every 6 (six) hours as needed for wheezing or shortness of breath.   aspirin EC 81 MG tablet Take 1 tablet (81 mg total) by mouth daily. What changed:  when to take this   atorvastatin 80 MG tablet Commonly known as:  LIPITOR Take 40 mg by mouth every evening.   benzonatate 100 MG capsule Commonly known as:  TESSALON Take 1 capsule (100 mg total) by mouth 3 (three) times daily as needed for cough. What changed:  how much to  take   carvedilol 3.125 MG tablet Commonly known as:  COREG Take 1 tablet (3.125 mg total) by mouth 2 (two) times daily with a meal.   cholecalciferol 1000 units tablet Commonly known as:  VITAMIN D Take 1,000 Units by mouth at bedtime.   clopidogrel 75 MG tablet Commonly known as:  PLAVIX Take 75 mg by mouth daily.   furosemide 20 MG tablet Commonly known as:  LASIX Take 1 tablet (20 mg total) by mouth daily.   glipiZIDE 5 MG tablet Commonly known as:  GLUCOTROL Take 5 mg by mouth 2 (two) times daily before a meal. Take 30 minutes prior to meals.   LORazepam 1 MG tablet Commonly known as:  ATIVAN TAKE 2 TABLETS BY MOUTH EVERY NIGHT AT BEDTIME AS NEEDED   losartan 25 MG tablet Commonly known as:  COZAAR Take 1 tablet (25 mg total) by mouth daily.   metFORMIN 500 MG tablet Commonly known as:  GLUCOPHAGE Take 1 tablet (500 mg total) by mouth 2 (two) times daily with a meal.   nitroGLYCERIN 0.4 MG SL tablet Commonly known as:  NITROSTAT Place 0.4 mg under the tongue every 5 (five) minutes as needed for chest pain.   oseltamivir 30 MG capsule Commonly known as:  TAMIFLU Take 1 capsule (30 mg total) by mouth 2 (two) times daily for 3 days. What changed:    medication strength  how much to take  when  to take this   Potassium Chloride ER 20 MEQ Tbcr Take 20 mEq by mouth daily.   ranitidine 150 MG tablet Commonly known as:  ZANTAC Take 150 mg by mouth 2 (two) times daily as needed for heartburn.        Brief H and P: For complete details please refer to admission H and P, but in brief patient is a 72 year old male who was diagnosed with flu 2 days prior outpatient, brought to ED at [facility] due to fevers 102 at home, acute onset of confusion.  He was found to be hypotensive with SBP in 70s, lactic acidosis, acute kidney injury. On 2/12, patient developed acute dyspnea, acute diffuse rhonchi.  He was given IV Lasix, started on heparin drip and BiPAP.  Critical  care, cardiology were consulted.  Hospital Course:     AKI (acute kidney injury) ([place]), lactic acidosis, hypotension -Patient met sepsis criteria at the time of admission due to lactic acidosis, hypotension, fevers, influenza -Patient was placed on aggressive IV fluid hydration. -Creatinine 1.8 at the time of admission, lactic acid 2.8 -Creatinine improved to 1.3 at the time of discharge. -BMET with primary care physician next week   NSTEMI with Hx of CABG x 3 2013, ischemic cardiomyopathy - On 2/12, patient developed acute dyspnea, acute diffuse rhonchi.  He was given IV Lasix, started on heparin drip and BiPAP.  Critical care, cardiology were consulted. -Troponins peaked at 12.75, cardiology was consulted -2D echo showed EF of 25% on 01/03/18 -Patient was transferred to [facility] and underwent cardiac cath -Cardiac cath showed severe three-vessel occlusive CAD however no targets of revascularization, dependent on free LIMA graft to LAD, recommended medical management -Patient follows cardiology at [facility], recommended follow-up appointment in 10-14 days.  Influenza with sepsis -Patient met sepsis criteria due to lactic acidosis, hypotension, fevers, and plans at the time of admission -Patient was placed on IV fluid hydration, he was on Tamiflu, now improving, complete the course   Acute hypoxic respiratory failure secondary to acute on chronic combined CHF, chronic LBBB -Patient had a sudden onset of respiratory distress with acute diffuse rhonchi bilateral lung fields on 2/12, had been taking nitroglycerin at home -He was placed on BiPAP, IV Lasix.  Stat echocardiogram showed reduced EF at 20-25% and troponin peaked at 12.75 -Cardiology was consulted, patient underwent cardiac cath, results as below -Transition to oral Lasix with potassium replacement per cardiology recommendations, will need BMET next week.  Hypokalemia -Replaced  Acute metabolic encephalopathy -Present on  admission likely due to influenza. -CT head negative for any intracranial abnormality, currently back to baseline  Generalized debility PT OT evaluation  Diabetes mellitus type 2 Hemoglobin A1c 6.4, continue glipizide, metformin, outpatient follow-up with PCP  Day of Discharge BP (!) 124/53 (BP Location: Right Arm)   Pulse 79   Temp (!) 97.5 F (36.4 C) (Oral)   Resp 20   Ht 6' (1.829 m)   Wt 73.8 kg (162 lb 11.2 oz)   SpO2 92%   BMI 22.07 kg/m   Physical Exam: General: Alert and awake oriented x3 not in any acute distress. HEENT: anicteric sclera, pupils reactive to light and accommodation CVS: S1-S2 clear no murmur rubs or gallops Chest: clear to auscultation bilaterally, no wheezing rales or rhonchi Abdomen: soft nontender, nondistended, normal bowel sounds Extremities: no cyanosis, clubbing or edema noted bilaterally Neuro: Cranial nerves II-XII intact, no focal neurological deficits   The results of significant diagnostics from this hospitalization (including imaging,  microbiology, ancillary and laboratory) are listed below for reference.    LAB RESULTS: Basic Metabolic Panel: Recent Labs  Lab 01/04/18 0455 01/05/18 0526 01/05/18 1850 01/06/18 0435  NA 139 140  --  139  K 3.1* 3.0*  --  3.2*  CL 102 105  --  105  CO2 23 23  --  20*  GLUCOSE 127* 157*  --  162*  BUN 27* 26*  --  24*  CREATININE 1.50* 1.32* 1.52* 1.37*  CALCIUM 9.2 8.8*  --  8.7*  MG 1.9  --   --   --    Liver Function Tests: Recent Labs  Lab 01/04/18 1744 01/05/18 0526  AST 59* 67*  ALT 32 48  ALKPHOS 54 55  BILITOT 1.5* 1.4*  PROT 7.2 7.1  ALBUMIN 3.3* 3.3*   No results for input(s): LIPASE, AMYLASE in the last 168 hours. Recent Labs  Lab 01/04/18 1744  AMMONIA 19   CBC: Recent Labs  Lab 01/04/18 0455  01/05/18 1850 01/06/18 0435  WBC 8.6   < > 6.8 7.4  NEUTROABS 6.2  --   --   --   HGB 12.5*   < > 12.9* 11.9*  HCT 36.4*   < > 38.0* 36.3*  MCV 87.7   < > 89.2 88.5   PLT 151   < > 168 164   < > = values in this interval not displayed.   Cardiac Enzymes: Recent Labs  Lab 2018/01/13 2255 01/04/18 0455  TROPONINI 12.75* 11.26*   BNP: Invalid input(s): POCBNP CBG: Recent Labs  Lab 01/05/18 2114 01/06/18 0543  GLUCAP 142* 162*    Significant Diagnostic Studies:  US Renal  Result Date: 2018/01/13 CLINICAL DATA:  Acute kidney injury. EXAM: RENAL / URINARY TRACT ULTRASOUND COMPLETE COMPARISON:  None. FINDINGS: Right Kidney: Length: 14.3 cm. Echogenicity within normal limits. No mass or hydronephrosis visualized. Left Kidney: Length: 11.8 cm. Slight diffuse thinning of the renal cortex as compared to the opposite side. Echogenicity within normal limits. No mass or hydronephrosis visualized. Bladder: Appears normal for degree of bladder distention. IMPRESSION: 1. No hydronephrosis. 2. Slight diffuse thinning of the left renal cortex, nonspecific. Electronically Signed   By: Lorriane Shire M.D.   On: 2018/01/13 10:27   Dg Chest Port 1 View  Result Date: 01/13/18 CLINICAL DATA:  Respiratory compromise EXAM: PORTABLE CHEST 1 VIEW COMPARISON:  01/02/2018 FINDINGS: Postop CABG. Bilateral interstitial edema. No significant pleural effusion. Negative for focal infiltrate. Cardiac enlargement. IMPRESSION: Cardiac enlargement with interstitial edema compatible with mild heart failure. Electronically Signed   By: Franchot Gallo M.D.   On: 01/13/2018 14:22   Dg Chest Port 1 View  Result Date: 01/02/2018 CLINICAL DATA:  Fever, diagnosed with the flu 2 days ago, history coronary artery disease, hypertension, diabetes mellitus, smoker EXAM: PORTABLE CHEST 1 VIEW COMPARISON:  Portable exam 1558 hours compared to 01/25/2016 FINDINGS: Normal heart size post CABG. Atherosclerotic calcification aorta. Mediastinal contours and pulmonary vascularity normal. Minimal chronic interstitial prominence stable. No acute infiltrate, pleural effusion or pneumothorax. Bones  unremarkable. IMPRESSION: Post CABG. Minimal chronic interstitial prominence without acute infiltrate. Electronically Signed   By: Lavonia Dana M.D.   On: 01/02/2018 16:16    Cardiac cath 2/14 1. Severe 3 vessel occlusive CAD    - 100% proximal LAD    - 100% proximal first diagonal    - 100% mid LCx after OM1. This is a large dominant vessel    - 99% ostial nondominant RCA  2. Patent free LIMA to the LAD. This supplies collaterals to other vessels. 3. Occluded SVG to first diagonal 4. Occluded SVG to OM 5. Moderately elevated LVEDP  Plan; there are no targets for revascularization. He is dependent on free LIMA graft to the LAD. Medical management.      2D ECHO: Study Conclusions  - Left ventricle: The cavity size was normal. Wall thickness was   normal. Systolic function was severely reduced. The estimated   ejection fraction was in the range of 20% to 25%. Diffuse   hypokinesis. Features are consistent with a pseudonormal left   ventricular filling pattern, with concomitant abnormal relaxation   and increased filling pressure (grade 2 diastolic dysfunction). - Mitral valve: Calcified annulus. There was mild regurgitation. - Left atrium: The atrium was mildly dilated.   Disposition and Follow-up: Discharge Instructions    (HEART FAILURE PATIENTS) Call MD:  Anytime you have any of the following symptoms: 1) 3 pound weight gain in 24 hours or 5 pounds in 1 week 2) shortness of breath, with or without a dry hacking cough 3) swelling in the hands, feet or stomach 4) if you have to sleep on extra pillows at night in order to breathe.   Complete by:  As directed    Increase activity slowly   Complete by:  As directed        DISPOSITION: home    DISCHARGE FOLLOW-UP Follow-up Information    Biagio Borg, MD. Schedule an appointment as soon as possible for a visit in 2 week(s).   Specialties:  Internal Medicine, Radiology Contact information: [place] [address] [phone]        Leonie Man, MD. Schedule an appointment as soon as possible for a visit in 2 week(s).   Specialty:  Cardiology Contact information: [address] [place] [address] [phone]            Time spent on Discharge: 46mns   Signed:   REstill CottaM.D. [facility] 01/06/2018, 11:41 AM Pager: [phone]

## 2018-01-06 NOTE — Care Management Note (Signed)
Case Management Note  Patient Details  Name: Jesse Hansen MRN: [id number] Date of Birth: 1946/09/19  Subjective/Objective:  From home with wife, he has PCP and medication coverage.  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 Domenica Fail fax phone is [phone].  He has Medicare as his primary and will be going through his primary insurance for [facility] services and DME.  NCM gave [name], she chose [name], referral made to [place] for [place], [place], [place] and [place] aide and rolling walker.  Soc will begin 24-48 hrs post dc.                   Action/Plan: NCM will follow for dc needs.   Expected Discharge Date:  01/06/18               Expected Discharge Plan:  [place]  In-House Referral:     Discharge planning Services  CM Consult  Post Acute Care Choice:  Home Health Choice offered to:  Spouse  DME Arranged:    DME Agency:     HH Arranged:  PT, RN, OT, Nurse's Aide [place] Agency:  [name]  Status of Service:  Completed, signed off  If discussed at [place] of Stay Meetings, dates discussed:    Additional Comments:  Zenon Mayo, RN 01/06/2018, 12:37 PM

## 2018-01-06 NOTE — Progress Notes (Addendum)
Progress Note  Patient Name: Jesse Hansen Start Date of Encounter: 01/06/2018  Primary Cardiologist: Followed at [facility] /Dr. Ellyn Hack   Subjective   Denies CP or dyspnea  Inpatient Medications    Scheduled Meds: . aspirin EC  81 mg Oral QHS  . atorvastatin  40 mg Oral QPM  . carvedilol  3.125 mg Oral BID WC  . chlorhexidine  15 mL Mouth Rinse BID  . clopidogrel  75 mg Oral Daily  . famotidine  20 mg Oral BID  . heparin  5,000 Units Subcutaneous Q8H  . insulin aspart  0-9 Units Subcutaneous TID WC  . mouth rinse  15 mL Mouth Rinse q12n4p  . nicotine  14 mg Transdermal Daily  . oseltamivir  30 mg Oral BID  . potassium chloride  40 mEq Oral Daily  . sodium chloride flush  3 mL Intravenous Q12H   Continuous Infusions: . sodium chloride     PRN Meds: sodium chloride, acetaminophen, albuterol, benzonatate, LORazepam, nitroGLYCERIN, ondansetron **OR** ondansetron (ZOFRAN) IV, sodium chloride flush   Vital Signs    Vitals:   01/06/18 0500 01/06/18 0545 01/06/18 0550 01/06/18 0714  BP:   (!) 124/51 (!) 124/53  Pulse:  (!) 51 81 79  Resp:  (!) 23 (!) 28 20  Temp:   97.7 F (36.5 C) (!) 97.5 F (36.4 C)  TempSrc:   Oral Oral  SpO2:  97% 92%   Weight: 162 lb 11.2 oz (73.8 kg)     Height:        Intake/Output Summary (Last 24 hours) at 01/06/2018 0744 Last data filed at 01/06/2018 0500 Gross per 24 hour  Intake 595.36 ml  Output 600 ml  Net -4.64 ml   Filed Weights   01/05/18 0305 01/05/18 1741 01/06/18 0500  Weight: 167 lb 5.3 oz (75.9 kg) 180 lb (81.6 kg) 162 lb 11.2 oz (73.8 kg)    Physical Exam   General: WD, WN NAD Head: Normal Neck: supple Lungs: Diminished BS with exp wheeze Cardiovascular: RRR Abdomen: Soft, non-tender, non-distended, right groin with no hematoma and no bruit Extremities: trace edema. Neuro: Grossly intact   Labs    Chemistry Recent Labs  Lab 01/02/18 1550  01/04/18 0455 01/04/18 1744 01/05/18 0526 01/05/18 1850 01/06/18 0435    NA 135   < > 139  --  140  --  139  K 2.9*   < > 3.1*  --  3.0*  --  3.2*  CL 100*   < > 102  --  105  --  105  CO2 23   < > 23  --  23  --  20*  GLUCOSE 118*   < > 127*  --  157*  --  162*  BUN 25*   < > 27*  --  26*  --  24*  CREATININE 1.80*   < > 1.50*  --  1.32* 1.52* 1.37*  CALCIUM 9.3   < > 9.2  --  8.8*  --  8.7*  PROT 7.4  --   --  7.2 7.1  --   --   ALBUMIN 3.8  --   --  3.3* 3.3*  --   --   AST 89*  --   --  59* 67*  --   --   ALT 20  --   --  32 48  --   --   ALKPHOS 60  --   --  54 55  --   --  BILITOT 1.3*  --   --  1.5* 1.4*  --   --   GFRNONAA 36*   < > 44*  --  52* 44* 50*  GFRAA 41*   < > 52*  --  >60 51* 57*  ANIONGAP 12   < > 14  --  12  --  14   < > = values in this interval not displayed.     Hematology Recent Labs  Lab 01/05/18 0526 01/05/18 1850 01/06/18 0435  WBC 8.2 6.8 7.4  RBC 4.02* 4.26 4.10*  HGB 12.0* 12.9* 11.9*  HCT 35.3* 38.0* 36.3*  MCV 87.8 89.2 88.5  MCH 29.9 30.3 29.0  MCHC 34.0 33.9 32.8  RDW 14.2 14.2 14.2  PLT 161 168 164    Cardiac Enzymes Recent Labs  Lab 01/03/18 1503 01/03/18 2255 01/04/18 0455  TROPONINI 6.19* 12.75* 11.26*    Radiology    Ct Head Wo Contrast  Result Date: 01/04/2018 CLINICAL DATA:  Altered level of consciousness (LOC), unexplained EXAM: CT HEAD WITHOUT CONTRAST TECHNIQUE: Contiguous axial images were obtained from the base of the skull through the vertex without intravenous contrast. COMPARISON:  None. FINDINGS: Brain: No acute intracranial hemorrhage. No focal mass lesion. No CT evidence of acute infarction. No midline shift or mass effect. No hydrocephalus. Basilar cisterns are patent. There are periventricular and subcortical white matter hypodensities. Generalized cortical atrophy. Vascular: No hyperdense vessel or unexpected calcification. Skull: Normal. Negative for fracture or focal lesion. Sinuses/Orbits: Mucosal thickening in the LEFT maxillary sinus with fluid level. Frontal sinuses are  clear Other: None IMPRESSION: 1. No acute intracranial findings. 2. Mild atrophy and white matter disease. 3. LEFT maxillary sinusitis. Electronically Signed   By: Suzy Bouchard M.D.   On: 01/04/2018 20:50   Dg Chest Port 1 View  Result Date: 01/04/2018 CLINICAL DATA:  Congestive failure and weakness EXAM: PORTABLE CHEST 1 VIEW COMPARISON:  01/03/2018 FINDINGS: Cardiac shadow is stable. Postoperative changes are again seen. Aortic calcifications are noted. No focal infiltrate or sizable effusion is seen. No bony abnormality is noted. IMPRESSION: No acute abnormality noted. Electronically Signed   By: Inez Catalina M.D.   On: 01/04/2018 09:15    Telemetry    01/05/18 NSR LBBB HR 64 - Personally Reviewed  ECG     01/03/18 NSR/NST with LBBB HR 109- Personally Reviewed  Cardiac Studies   Echo: 01/03/18 Study Conclusions  - Left ventricle: The cavity size was normal. Wall thickness was   normal. Systolic function was severely reduced. The estimated   ejection fraction was in the range of 20% to 25%. Diffuse   hypokinesis. Features are consistent with a pseudonormal left   ventricular filling pattern, with concomitant abnormal relaxation   and increased filling pressure (grade 2 diastolic dysfunction). - Mitral valve: Calcified annulus. There was mild regurgitation. - Left atrium: The atrium was mildly dilated.  Patient Profile     72 y.o. 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 Erlinda Hong.  Assessment & Plan    1. Non-ST elevation MI: -Cath results noted; plan medical therapy; continue ASA, plavix, coreg and statin.   2. Ischemic Cardiomyopathy: -EF 25% per echo 01/03/18 -Continue coreg; will add  cozaar 25 mg daily;  titrate meds as outpt. -Repeat echo will need to be performed in 3 months; if EF less than 35%, will need to be considered for CRT-D  3. Acute systolic congestive heart failure: -resume lasix 20 mg daily  4. Acute on chronic kidney disease stage III: -Renal function stable; would recheck BMET Monday.  5. Hyperlipidemia: -continue statin  6. Influenza: -Per primary -Afebrile  7. Hypokalemia: -supplement  Pt can be DCed from a cardiac standpoint and fu at the [place] with his cardiologist; would check bmet on Monday with his primary care. Please call with questions.  Signed, Kirk Ruths MD 01/06/2018, 7:44 AM    For questions or updates, please contact   Please consult [email or web address] for contact info under Cardiology/STEMI.

## 2018-01-06 NOTE — Evaluation (Signed)
Physical Therapy Re-Evaluation Patient Details Name: Jesse Hansen MRN: [id number] DOB: [birth year] Today's Date: 01/06/2018   History of Present Illness  72yo male recently diagnosed with flu and put on Tamiflu, but continued to have persistent fevers and increased weakness, loose bowels. Diagnosed with AKI, flu, orthostatic hypotension. Also presented with elevated troponins and underwent Cardiac cath on 2/14. Found to have NSTEMI, per cardiology to be managed, medically.  PMH HTN, CAD with CABG, DM, PTSD, tobacco abuse, anxiety, PVD, hx AAA repair, carotid stent   Clinical Impression  Pt previously evaluated earlier in acute care stay, however, then developed cardiac problems above. Pt unsteady without use of AD, requiring min to min guard A. Pt also demonstrating STM deficits and slowed cognitive processing. Educated pt and pt's wife about use of RW at home and pt wife reports she has access to [employer]. If pt does not have access, will need RW. Will continue to follow acutely to maximize functional mobility independence and safety.     Follow Up Recommendations Home health PT;Supervision/Assistance - 24 hour    Equipment Recommendations  Rolling walker with 5" wheels(if pt does not have access to )    Recommendations for Other Services       Precautions / Restrictions Precautions Precautions: Fall Precaution Comments: Droplet precautions.  Restrictions Weight Bearing Restrictions: No      Mobility  Bed Mobility Overal bed mobility: Modified Independent                Transfers Overall transfer level: Needs assistance Equipment used: None Transfers: Sit to/from Stand Sit to Stand: Min guard         General transfer comment: Min guard for safety. Slow to follow command to stand.   Ambulation/Gait Ambulation/Gait assistance: Min guard;Min assist Ambulation Distance (Feet): 125 Feet Assistive device: None Gait Pattern/deviations: Step-through pattern;Decreased stride  length Gait velocity: Decreased  Gait velocity interpretation: Below normal speed for age/gender General Gait Details: Min to min guard assist secondary to unsteadiness without use of AD. Especially unsteady when turning. Educated pt and wife about need for use of RW at home and pt's wife reports she thinks they have access to one.   Stairs            Wheelchair Mobility    Modified Rankin (Stroke Patients Only)       Balance Overall balance assessment: Needs assistance Sitting-balance support: No upper extremity supported;Feet supported Sitting balance-Leahy Scale: Good     Standing balance support: No upper extremity supported;During functional activity Standing balance-Leahy Scale: Fair Standing balance comment: Reliant on Min to min guard assist during ambulation. However, no assist required in standing.                              Pertinent Vitals/Pain Pain Assessment: No/denies pain    Home Living Family/patient expects to be discharged to:: Private residence Living Arrangements: Spouse/significant other Available Help at Discharge: Family Type of Home: House Home Access: Level entry     Home Layout: One level Home Equipment: Kasandra Knudsen - single point Additional Comments: Wife reports she thinks they have access to RW.      Prior Function Level of Independence: Independent         Comments: wife reports patient was doing very well before, worked on his car every day      Hand Dominance        Extremity/Trunk Assessment   Upper Extremity  Assessment Upper Extremity Assessment: Generalized weakness    Lower Extremity Assessment Lower Extremity Assessment: Generalized weakness    Cervical / Trunk Assessment Cervical / Trunk Assessment: Normal  Communication   Communication: HOH  Cognition Arousal/Alertness: Awake/alert Behavior During Therapy: WFL for tasks assessed/performed Overall Cognitive Status: Impaired/Different from  baseline Area of Impairment: Memory;Problem solving                     Memory: Decreased short-term memory       Problem Solving: Slow processing;Requires verbal cues General Comments: Pt reporting he has RW, and then reports he does not. Confirmed with wife they used to but unsure of where it is.       General Comments General comments (skin integrity, edema, etc.): HR ranged from 70s to low 90s throughout session.     Exercises     Assessment/Plan    PT Assessment Patient needs continued PT services  PT Problem List Decreased strength;Decreased mobility;Decreased safety awareness;Decreased coordination;Decreased balance;Decreased activity tolerance;Decreased cognition;Decreased knowledge of use of DME       PT Treatment Interventions DME instruction;Therapeutic activities;Therapeutic exercise;Gait training;Patient/family education;Balance training;Functional mobility training;Neuromuscular re-education    PT Goals (Current goals can be found in the Care Plan section)  Acute Rehab PT Goals Patient Stated Goal: to feel better  PT Goal Formulation: With patient/family Time For Goal Achievement: 01/20/18 Potential to Achieve Goals: Good    Frequency Min 3X/week   Barriers to discharge        Co-evaluation               AM-PAC PT "6 Clicks" Daily Activity  Outcome Measure Difficulty turning over in bed (including adjusting bedclothes, sheets and blankets)?: None Difficulty moving from lying on back to sitting on the side of the bed? : A Little Difficulty sitting down on and standing up from a chair with arms (e.g., wheelchair, bedside commode, etc,.)?: Unable Help needed moving to and from a bed to chair (including a wheelchair)?: A Little Help needed walking in hospital room?: A Little Help needed climbing 3-5 steps with a railing? : A Little 6 Click Score: 17    End of Session Equipment Utilized During Treatment: Gait belt Activity Tolerance:  Patient tolerated treatment well Patient left: in bed;with call bell/phone within reach;with family/visitor present Nurse Communication: Mobility status PT Visit Diagnosis: Unsteadiness on feet (R26.81);Muscle weakness (generalized) (M62.81)    Time: 4259-5638 PT Time Calculation (min) (ACUTE ONLY): 18 min   Charges:   PT Evaluation $PT Re-evaluation: 1 Re-eval     PT G Codes:        Leighton Ruff, PT, DPT  Acute Rehabilitation Services  Pager: [phone]   Rudean Hitt 01/06/2018, 11:58 AM

## 2018-01-07 LAB — CULTURE, BLOOD (ROUTINE X 2)
CULTURE: NO GROWTH
Culture: NO GROWTH
SPECIAL REQUESTS: ADEQUATE
Special Requests: ADEQUATE

## 2018-04-03 ENCOUNTER — Encounter (HOSPITAL_COMMUNITY): Payer: Non-veteran care

## 2018-04-03 ENCOUNTER — Other Ambulatory Visit: Payer: Self-pay | Admitting: Internal Medicine

## 2018-04-03 ENCOUNTER — Ambulatory Visit: Payer: Non-veteran care | Admitting: Family

## 2018-04-03 NOTE — Telephone Encounter (Signed)
Denied. Pt has not been seen since 03/17/17. All issues and medication request will be addressed at tomorrow's appt.

## 2018-04-03 NOTE — Telephone Encounter (Signed)
Pt made apt for tomorrow and wants to know if he can get a couple pills called in for tonight,  Pharmacy closes at [place], [place] - [address] [phone] (Phone) [phone] (Fax)

## 2018-04-04 ENCOUNTER — Ambulatory Visit (INDEPENDENT_AMBULATORY_CARE_PROVIDER_SITE_OTHER): Payer: No Typology Code available for payment source | Admitting: Internal Medicine

## 2018-04-04 ENCOUNTER — Encounter: Payer: Self-pay | Admitting: Internal Medicine

## 2018-04-04 VITALS — BP 124/80 | HR 65 | Temp 98.3°F | Ht 72.0 in | Wt 169.0 lb

## 2018-04-04 DIAGNOSIS — Z1159 Encounter for screening for other viral diseases: Secondary | ICD-10-CM | POA: Diagnosis not present

## 2018-04-04 DIAGNOSIS — Z0000 Encounter for general adult medical examination without abnormal findings: Secondary | ICD-10-CM

## 2018-04-04 MED ORDER — LORAZEPAM 1 MG PO TABS: ORAL_TABLET | ORAL | 2 refills | Status: DC

## 2018-04-04 NOTE — Patient Instructions (Addendum)
Please let them know at the [place] that you may need the Pneumovax pneumonia shot and the Tdap tetanus shot  Please continue all other medications as before, and refills have been done if requested.  Please have the pharmacy call with any other refills you may need.  Please continue your efforts at being more active, low cholesterol diet, and weight control.  You are otherwise up to date with prevention measures today.  Please keep your appointments with your specialists as you may have planned  Please forward a copy of your [facility] labs if you are able  Please return in 1 year for your yearly visit, or sooner if needed

## 2018-04-04 NOTE — Assessment & Plan Note (Signed)

## 2018-04-04 NOTE — Progress Notes (Signed)
Subjective:    Patient ID: Jesse Hansen, male    DOB: September 19, 1946, 72 y.o.   MRN: [id number]  HPI  Here for wellness and f/u;  Overall doing ok;  Pt denies Chest pain, worsening SOB, DOE, wheezing, orthopnea, PND, worsening LE edema, palpitations, dizziness or syncope.  Pt denies neurological change such as new headache, facial or extremity weakness.  Pt denies polydipsia, polyuria, or low sugar symptoms. Pt states overall good compliance with treatment and medications, good tolerability, and has been trying to follow appropriate diet.  Pt denies worsening depressive symptoms, suicidal ideation or panic. No fever, night sweats, wt loss, loss of appetite, or other constitutional symptoms.  Pt states good ability with ADL's, has low fall risk, home safety reviewed and adequate, no other significant changes in hearing or vision, and only occasionally active with exercise.  Has colonoscopy next month at [place]. No new complaints or interval hx Past Medical History:  Diagnosis Date  . Agent orange exposure   . Anxiety   . Carotid artery disease ([place])    a. s/p carotid endarterectomy remotely.  . Chronic systolic CHF (congestive heart failure) ([place])   . COLONIC POLYPS, HX OF 01/29/2011   Qualifier: Diagnosis of  By: Jenny Reichmann MD, Hunt Oris   . Coronary artery disease involving native coronary artery with angina pectoris ([place])    a. s/p Multiple PCIs. b. CABG x 3 in 2013 (SVG-OM1, SVG-Diag with freeLIMA-LAD from SVG-Diag hood) --> relook cath in ~2015 with at least 1 graft ~occluded by report.  . Diabetes mellitus with circulatory complication ([place])   . ERECTILE DYSFUNCTION 08/15/2007   Qualifier: Diagnosis of  By: Dance CMA ([place]), Kim    . Essential hypertension   . Hyperlipidemia   . Insomnia   . LBBB (left bundle branch block)   . PTSD 07/30/2010   Qualifier: Diagnosis of  By: Jenny Reichmann MD, Hunt Oris   . PVD (peripheral vascular disease) ([place])    a. s/p AOBF and LCE in the 1990s.   Past Surgical History:    Procedure Laterality Date  . ABDOMINAL AORTIC ANEURYSM REPAIR  1996?   Done at [facility] by Dr. Donnetta Hutching  . CAROTID ENDARTERECTOMY Left    Dr. Volney American at [facility] in [place]   . CAROTID PTA/STENT INTERVENTION Left 03/24/2017   Procedure: Carotid PTA/Stent Intervention;  Surgeon: Serafina Mitchell, MD;  Location: [place] [facility];  Service: Cardiovascular;  Laterality: Left;  . CORONARY ARTERY BYPASS GRAFT  2013   @ [place], [place] ([place] was contaminated) -- SVG-Diag with freeLIMA-LAD sewn to SVG hood, SVG-OM1  . CORONARY STENT PLACEMENT  2007  . LEFT HEART CATH AND CORS/GRAFTS ANGIOGRAPHY N/A 01/05/2018   Procedure: LEFT HEART CATH AND CORS/GRAFTS ANGIOGRAPHY;  Surgeon: Martinique, Peter M, MD;  Location: [place] [facility];  Service: Cardiovascular;  Laterality: N/A;  . PROSTATE BIOPSY  2009    reports that he has been smoking cigarettes.  He has never used smokeless tobacco. He reports that he drinks alcohol. He reports that he does not use drugs. family history includes Hypertension in his father. Allergies  Allergen Reactions  . Codeine Nausea And Vomiting  . Nicotine     Nicotine Patch--Per Nurse "heart issues"   Current Outpatient Medications on File Prior to Visit  Medication Sig Dispense Refill  . acetaminophen (TYLENOL) 325 MG tablet Take 650 mg by mouth every 6 (six) hours as needed.    Marland Kitchen albuterol (PROVENTIL) (  2.5 MG/3ML) 0.083% nebulizer solution Take 2.5 mg by nebulization every 6 (six) hours as needed for wheezing or shortness of breath.    Marland Kitchen aspirin EC 81 MG tablet Take 1 tablet (81 mg total) by mouth daily. (Patient taking differently: Take 81 mg by mouth at bedtime. ) 90 tablet 11  . atorvastatin (LIPITOR) 80 MG tablet Take 40 mg by mouth every evening.     . carvedilol (COREG) 3.125 MG tablet Take 1 tablet (3.125 mg total) by mouth 2 (two) times daily with a meal. 60 tablet 3  . cholecalciferol (VITAMIN D) 1000 units tablet Take 1,000 Units by mouth at  bedtime.    . clopidogrel (PLAVIX) 75 MG tablet Take 75 mg by mouth daily.    . furosemide (LASIX) 20 MG tablet Take 1 tablet (20 mg total) by mouth daily. 30 tablet 3  . glipiZIDE (GLUCOTROL) 5 MG tablet Take 5 mg by mouth 2 (two) times daily before a meal. Take 30 minutes prior to meals.    Marland Kitchen losartan (COZAAR) 25 MG tablet Take 1 tablet (25 mg total) by mouth daily. 30 tablet 4  . metFORMIN (GLUCOPHAGE) 500 MG tablet Take 1 tablet (500 mg total) by mouth 2 (two) times daily with a meal.    . nitroGLYCERIN (NITROSTAT) 0.4 MG SL tablet Place 0.4 mg under the tongue every 5 (five) minutes as needed for chest pain.    . potassium chloride 20 MEQ TBCR Take 20 mEq by mouth daily. 30 tablet 3  . ranitidine (ZANTAC) 150 MG tablet Take 150 mg by mouth 2 (two) times daily as needed for heartburn.     No current facility-administered medications on file prior to visit.    Review of Systems Constitutional: Negative for other unusual diaphoresis, sweats, appetite or weight changes HENT: Negative for other worsening hearing loss, ear pain, facial swelling, mouth sores or neck stiffness.   Eyes: Negative for other worsening pain, redness or other visual disturbance.  Respiratory: Negative for other stridor or swelling Cardiovascular: Negative for other palpitations or other chest pain  Gastrointestinal: Negative for worsening diarrhea or loose stools, blood in stool, distention or other pain Genitourinary: Negative for hematuria, flank pain or other change in urine volume.  Musculoskeletal: Negative for myalgias or other joint swelling.  Skin: Negative for other color change, or other wound or worsening drainage.  Neurological: Negative for other syncope or numbness. Hematological: Negative for other adenopathy or swelling Psychiatric/Behavioral: Negative for hallucinations, other worsening agitation, SI, self-injury, or new decreased concentration All other system neg per pt   Objective:   Physical  Exam BP 124/80   Pulse 65   Temp 98.3 F (36.8 C) (Oral)   Ht 6' (1.829 m)   Wt 169 lb (76.7 kg)   SpO2 96%   BMI 22.92 kg/m  VS noted,  Constitutional: Pt is oriented to person, place, and time. Appears well-developed and well-nourished, in no significant distress and comfortable Head: Normocephalic and atraumatic  Eyes: Conjunctivae and EOM are normal. Pupils are equal, round, and reactive to light Right Ear: External ear normal without discharge Left Ear: External ear normal without discharge Nose: Nose without discharge or deformity Mouth/Throat: Oropharynx is without other ulcerations and moist  Neck: Normal range of motion. Neck supple. No JVD present. No tracheal deviation present or significant neck LA or mass Cardiovascular: Normal rate, regular rhythm, normal heart sounds and intact distal pulses.   Pulmonary/Chest: WOB normal and breath sounds without rales or wheezing  Abdominal:  Soft. Bowel sounds are normal. NT. No HSM  Musculoskeletal: Normal range of motion. Exhibits no edema Lymphadenopathy: Has no other cervical adenopathy.  Neurological: Pt is alert and oriented to person, place, and time. Pt has normal reflexes. No cranial nerve deficit. Motor grossly intact, Gait intact Skin: Skin is warm and dry. No rash noted or new ulcerations Psychiatric:  Has normal mood and affect. Behavior is normal without agitation No other exam findings  declines further lab today - just done at [place]:

## 2018-04-25 ENCOUNTER — Encounter: Payer: Self-pay | Admitting: Family

## 2018-04-25 ENCOUNTER — Ambulatory Visit (HOSPITAL_COMMUNITY)
Admission: RE | Admit: 2018-04-25 | Discharge: 2018-04-25 | Disposition: A | Discharge status: Home / Self Care | Payer: Non-veteran care | Source: Ambulatory Visit | Attending: Surgery | Admitting: Surgery

## 2018-04-25 ENCOUNTER — Ambulatory Visit (INDEPENDENT_AMBULATORY_CARE_PROVIDER_SITE_OTHER): Payer: Non-veteran care | Admitting: Family

## 2018-04-25 VITALS — BP 128/69 | HR 61 | Temp 97.0°F | Resp 16 | Ht 72.0 in | Wt 171.9 lb

## 2018-04-25 DIAGNOSIS — I6522 Occlusion and stenosis of left carotid artery: Secondary | ICD-10-CM

## 2018-04-25 DIAGNOSIS — I779 Disorder of arteries and arterioles, unspecified: Secondary | ICD-10-CM | POA: Diagnosis not present

## 2018-04-25 DIAGNOSIS — Z95828 Presence of other vascular implants and grafts: Secondary | ICD-10-CM

## 2018-04-25 DIAGNOSIS — I6523 Occlusion and stenosis of bilateral carotid arteries: Secondary | ICD-10-CM | POA: Insufficient documentation

## 2018-04-25 DIAGNOSIS — Z9889 Other specified postprocedural states: Secondary | ICD-10-CM

## 2018-04-25 DIAGNOSIS — I6521 Occlusion and stenosis of right carotid artery: Secondary | ICD-10-CM

## 2018-04-25 NOTE — Progress Notes (Signed)
Chief Complaint: Follow up Extracranial Carotid Artery Stenosis   History of Present Illness  Jesse Hansen is a 72 y.o. male who is s/p left carotid stenting on 05/09/2017 by Dr. Trula Slade.  He was originally seen by Dr. Donnetta Hutching.  He has a history of left carotid endarterectomy at the [facility] in [facility].  His postoperative course was complicated by dysphagia and hoarseness which eventually resolved. The patient tolerated his procedure well.  He had mild ecchymosis in his right groin.  He has not had any neurologic deficits.  It seems like pt may have had an aortobifemoral bypass graft for hip claudication in 1996 by Dr. Donnetta Hutching.  He has an old well healed midline abdominal scar.   Dr. Trula Slade last evaluated pt on 05-09-17. At that time right groin was well healed without evidence of pseudoaneurysm. Neurologically intact Dr. Trula Slade reviewed his carotid duplex which showed a widely patent left carotid endarterectomy site.  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 Status post left carotid stenting for recurrent asymptomatic stenosis.  The patient was doing very well.  His initial postprocedure ultrasound showed a widely patent stent.  He will need to continue his aspirin and Plavix. Dr. Trula Slade advised follow-up in 6 months with a repeat ultrasound.  He had a cardiac cath in February 2019, states no intervention could be done.   He states he has had a mild stroke in the remote past.  Other medical problems include CHF, exposure to agent orange, PTSD, CAD, and continued tobacco use.   Diabetic: yes, 6.4 A1C on 01-04-18 Tobacco use: smoker  (1/2 ppd, decreased from 3 ppd, started in his teens)  Pt meds include: Statin : yes ASA: yes Other anticoagulants/antiplatelets: Plavix   Past Medical History:  Diagnosis Date  . Agent orange exposure   . Anxiety   . Carotid artery disease ([place])    a. s/p carotid endarterectomy remotely.  . Chronic systolic CHF (congestive  heart failure) ([place])   . COLONIC POLYPS, HX OF 01/29/2011   Qualifier: Diagnosis of  By: Jenny Reichmann MD, Hunt Oris   . Coronary artery disease involving native coronary artery with angina pectoris ([place])    a. s/p Multiple PCIs. b. CABG x 3 in 2013 (SVG-OM1, SVG-Diag with freeLIMA-LAD from SVG-Diag hood) --> relook cath in ~2015 with at least 1 graft ~occluded by report.  . Diabetes mellitus with circulatory complication ([place])   . ERECTILE DYSFUNCTION 08/15/2007   Qualifier: Diagnosis of  By: Dance CMA ([place]), Kim    . Essential hypertension   . Hyperlipidemia   . Insomnia   . LBBB (left bundle branch block)   . PTSD 07/30/2010   Qualifier: Diagnosis of  By: Jenny Reichmann MD, Hunt Oris   . PVD (peripheral vascular disease) ([place])    a. s/p AOBF and LCE in the 1990s.    Social History Social History   Tobacco Use  . Smoking status: Current Every Day Smoker    Types: Cigarettes  . Smokeless tobacco: Never Used  . Tobacco comment: 4-8 cigarettes per day  Substance Use Topics  . Alcohol use: Yes    Alcohol/week: 0.0 oz  . Drug use: No    Family History Family History  Problem Relation Age of Onset  . Hypertension Father     Surgical History Past Surgical History:  Procedure Laterality Date  . ABDOMINAL AORTIC ANEURYSM REPAIR  1996?   Done at [facility] by Dr. Donnetta Hutching  . CAROTID ENDARTERECTOMY Left  Dr. Volney American at [facility] in [place]   . CAROTID PTA/STENT INTERVENTION Left 03/24/2017   Procedure: Carotid PTA/Stent Intervention;  Surgeon: Serafina Mitchell, MD;  Location: [facility];  Service: Cardiovascular;  Laterality: Left;  . CORONARY ARTERY BYPASS GRAFT  2013   @ [place], [place] ([place] was contaminated) -- SVG-Diag with freeLIMA-LAD sewn to SVG hood, SVG-OM1  . CORONARY STENT PLACEMENT  2007  . LEFT HEART CATH AND CORS/GRAFTS ANGIOGRAPHY N/A 01/05/2018   Procedure: LEFT HEART CATH AND CORS/GRAFTS ANGIOGRAPHY;  Surgeon: Martinique, Peter M, MD;  Location: [facility];  Service: Cardiovascular;  Laterality: N/A;  . PROSTATE BIOPSY  2009    Allergies  Allergen Reactions  . Codeine Nausea And Vomiting  . Nicotine     Nicotine Patch--Per Nurse "heart issues"    Current Outpatient Medications  Medication Sig Dispense Refill  . aspirin EC 81 MG tablet Take 1 tablet (81 mg total) by mouth daily. (Patient taking differently: Take 81 mg by mouth at bedtime. ) 90 tablet 11  . atorvastatin (LIPITOR) 80 MG tablet Take 40 mg by mouth every evening.     . carvedilol (COREG) 3.125 MG tablet Take 1 tablet (3.125 mg total) by mouth 2 (two) times daily with a meal. 60 tablet 3  . cholecalciferol (VITAMIN D) 1000 units tablet Take 1,000 Units by mouth at bedtime.    . clopidogrel (PLAVIX) 75 MG tablet Take 75 mg by mouth daily.    . furosemide (LASIX) 20 MG tablet Take 1 tablet (20 mg total) by mouth daily. 30 tablet 3  . glipiZIDE (GLUCOTROL) 5 MG tablet Take 5 mg by mouth 2 (two) times daily before a meal. Take 30 minutes prior to meals.    Marland Kitchen LORazepam (ATIVAN) 1 MG tablet TAKE 2 TABLETS BY MOUTH EVERY NIGHT AT BEDTIME AS NEEDED 60 tablet 2  . losartan (COZAAR) 25 MG tablet Take 1 tablet (25 mg total) by mouth daily. 30 tablet 4  . metFORMIN (GLUCOPHAGE) 500 MG tablet Take 1 tablet (500 mg total) by mouth 2 (two) times daily with a meal. (Patient taking differently: Take 1,000 mg by mouth 2 (two) times daily with a meal. )    . nitroGLYCERIN (NITROSTAT) 0.4 MG SL tablet Place 0.4 mg under the tongue every 5 (five) minutes as needed for chest pain.    . potassium chloride 20 MEQ TBCR Take 20 mEq by mouth daily. 30 tablet 3  . ranitidine (ZANTAC) 150 MG tablet Take 150 mg by mouth 2 (two) times daily as needed for heartburn.    Marland Kitchen acetaminophen (TYLENOL) 325 MG tablet Take 650 mg by mouth every 6 (six) hours as needed.     No current facility-administered medications for this visit.     Review of Systems : See HPI for pertinent positives and negatives.  Physical  Examination  Vitals:   04/25/18 1441  BP: 128/69  Pulse: 61  Resp: 16  Temp: (!) 97 F (36.1 C)  TempSrc: Oral  SpO2: 99%  Weight: 171 lb 14.4 oz (78 kg)  Height: 6' (1.829 m)   Body mass index is 23.31 kg/m.  General: WDWN male in NAD GAIT: normal Eyes: PERRLA HENT: No gross abnormalities.  Pulmonary:  Respirations are non-labored, limited air movement in all fields, CTAB, no rales, no rhonchi, or wheezing. Cardiac: regular rhythm, no detected murmur.  VASCULAR EXAM Carotid Bruits Right Left   Negative Negative  Abdominal aortic pulse is not palpable. Radial pulses are 1+ palpable and equal.                                                                                                                            LE Pulses Right Left       FEMORAL  1+ palpable  1+ palpable        POPLITEAL  not palpable   not palpable       POSTERIOR TIBIAL  not palpable   not palpable        DORSALIS PEDIS      ANTERIOR TIBIAL 1+ palpable  not palpable     Gastrointestinal: soft, nontender, BS WNL, no r/g, no palpable masses. Old well healed midline abdominal scar.  Musculoskeletal: no muscle atrophy/wasting. M/S 5/5 throughout, extremities without ischemic changes. Skin: No rashes, no ulcers, no cellulitis.   Neurologic:  A&O X 3; appropriate affect, sensation is normal; speech is normal, CN 2-12 intact, pain and light touch intact in extremities, motor exam as listed above. Psychiatric: Normal thought content, mood appropriate to clinical situation.    Assessment: Jesse Hansen is a 72 y.o. male who is s/p left carotid stenting on 05/09/2017 by Dr. Trula Slade.  He was originally seen by Dr. Donnetta Hutching.  He has a history of left carotid endarterectomy at the [facility] in [facility]. He has a known right ICA occlusion.   He states he has had a mild stroke in the remote past, no recent neurological events.    It seems like pt may have had an aortobifemoral bypass  graft for hip claudication in 1996 by Dr. Donnetta Hutching.  His pedal pulses are faintly palpable, there are no signs of ischemia in his feet or legs.   His atherosclerotic risk factors include continued tobacco use since his teens, currently in control DM, CAD, and exposure to agent orange.   He takes a daily ASA, statin, and Plavix.      DATA Carotid Duplex (04/25/18): Right ICA: confirmed occlusion. Left ICA: no stenosis, no in stent stenosis  Right vertebral artery flow is antegrade, left is retrograde. Right subclavian artery waveforms are normal, left are monophasic.  No significant change compared to the exam on 05-06-17.    Plan: Follow-up in 1 year with Carotid Duplex scan and ABI's.  I advised pt and wife to notify us if he develops concerns re the circulation in his feet or legs.   Over 3 minutes was spent counseling patient re smoking cessation, and patient was given several free resources re smoking cessation.     I discussed in depth with the patient the nature of atherosclerosis, and emphasized the importance of maximal medical management including strict control of blood pressure, blood glucose, and lipid levels, obtaining regular exercise, and cessation of smoking.  The patient is aware that without maximal medical management the underlying atherosclerotic disease process will progress, limiting the benefit of any interventions.  The patient was given information about stroke prevention and what symptoms should prompt the patient to seek immediate medical care. Thank you for allowing [place] to participate in this patient's care.  Clemon Chambers, RN, MSN, FNP-C [facility] Office: [phone]  Clinic Physician: Scot Dock on call  04/25/18 6:26 PM

## 2018-04-25 NOTE — Patient Instructions (Addendum)
Steps to Quit Smoking Smoking tobacco can be bad for your health. It can also affect almost every organ in your body. Smoking puts you and people around you at risk for many serious long-lasting (chronic) diseases. Quitting smoking is hard, but it is one of the Mathew things that you can do for your health. It is never too late to quit. What are the benefits of quitting smoking? When you quit smoking, you lower your risk for getting serious diseases and conditions. They can include:  Lung cancer or lung disease.  Heart disease.  Stroke.  Heart attack.  Not being able to have children (infertility).  Weak bones (osteoporosis) and broken bones (fractures).  If you have coughing, wheezing, and shortness of breath, those symptoms may get better when you quit. You may also get sick less often. If you are pregnant, quitting smoking can help to lower your chances of having a baby of low birth weight. What can I do to help me quit smoking? Talk with your doctor about what can help you quit smoking. Some things you can do (strategies) include:  Quitting smoking totally, instead of slowly cutting back how much you smoke over a period of time.  Going to in-person counseling. You are more likely to quit if you go to many counseling sessions.  Using resources and support systems, such as: ? Online chats with a counselor. ? Phone quitlines. ? Printed self-help materials. ? Support groups or group counseling. ? Text messaging programs. ? Mobile phone apps or applications.  Taking medicines. Some of these medicines may have nicotine in them. If you are pregnant or breastfeeding, do not take any medicines to quit smoking unless your doctor says it is okay. Talk with your doctor about counseling or other things that can help you.  Talk with your doctor about using more than one strategy at the same time, such as taking medicines while you are also going to in-person counseling. This can help make  quitting easier. What things can I do to make it easier to quit? Quitting smoking might feel very hard at first, but there is a lot that you can do to make it easier. Take these steps:  Talk to your family and friends. Ask them to support and encourage you.  Call phone quitlines, reach out to support groups, or work with a counselor.  Ask people who smoke to not smoke around you.  Avoid places that make you want (trigger) to smoke, such as: ? Bars. ? Parties. ? Smoke-break areas at work.  Spend time with people who do not smoke.  Lower the stress in your life. Stress can make you want to smoke. Try these things to help your stress: ? Getting regular exercise. ? Deep-breathing exercises. ? Yoga. ? Meditating. ? Doing a body scan. To do this, close your eyes, focus on one area of your body at a time from head to toe, and notice which parts of your body are tense. Try to relax the muscles in those areas.  Download or buy apps on your mobile phone or tablet that can help you stick to your quit plan. There are many free apps, such as QuitGuide from the CDC (Centers for Disease Control and Prevention). You can find more support from smokefree.gov and other websites.  This information is not intended to replace advice given to you by your health care provider. Make sure you discuss any questions you have with your health care provider. Document Released: 09/04/2009 Document   Revised: 07/06/2016 Document Reviewed: 03/25/2015 Elsevier Interactive Patient Education  2018 Elsevier Inc.   Stroke Prevention Some health problems and behaviors may make it more likely for you to have a stroke. Below are ways to lessen your risk of having a stroke.  Be active for at least 30 minutes on most or all days.  Do not smoke. Try not to be around others who smoke.  Do not drink too much alcohol. ? Do not have more than 2 drinks a day if you are a man. ? Do not have more than 1 drink a day if you are a  woman and are not pregnant.  Eat healthy foods, such as fruits and vegetables. If you were put on a specific diet, follow the diet as told.  Keep your cholesterol levels under control through diet and medicines. Look for foods that are low in saturated fat, trans fat, cholesterol, and are high in fiber.  If you have diabetes, follow all diet plans and take your medicine as told.  Ask your doctor if you need treatment to lower your blood pressure. If you have high blood pressure (hypertension), follow all diet plans and take your medicine as told by your doctor.  If you are 18-39 years old, have your blood pressure checked every 3-5 years. If you are age 40 or older, have your blood pressure checked every year.  Keep a healthy weight. Eat foods that are low in calories, salt, saturated fat, trans fat, and cholesterol.  Do not take drugs.  Avoid birth control pills, if this applies. Talk to your doctor about the risks of taking birth control pills.  Talk to your doctor if you have sleep problems (sleep apnea).  Take all medicine as told by your doctor. ? You may be told to take aspirin or blood thinner medicine. Take this medicine as told by your doctor. ? Understand your medicine instructions.  Make sure any other conditions you have are being taken care of.  Get help right away if:  You suddenly lose feeling (you feel numb) or have weakness in your face, arm, or leg.  Your face or eyelid hangs down to one side.  You suddenly feel confused.  You have trouble talking (aphasia) or understanding what people are saying.  You suddenly have trouble seeing in one or both eyes.  You suddenly have trouble walking.  You are dizzy.  You lose your balance or your movements are clumsy (uncoordinated).  You suddenly have a very bad headache and you do not know the cause.  You have new chest pain.  Your heart feels like it is fluttering or skipping a beat (irregular heartbeat). Do  not wait to see if the symptoms above go away. Get help right away. Call your local emergency services (911 in [place] Do not drive yourself to the hospital. This information is not intended to replace advice given to you by your health care provider. Make sure you discuss any questions you have with your health care provider. Document Released: 05/09/2012 Document Revised: 04/15/2016 Document Reviewed: 05/11/2013 Elsevier Interactive Patient Education  2018 Elsevier Inc.     Peripheral Vascular Disease Peripheral vascular disease (PVD) is a disease of the blood vessels that are not part of your heart and brain. A simple term for PVD is poor circulation. In most cases, PVD narrows the blood vessels that carry blood from your heart to the rest of your body. This can result in a decreased supply of blood to   your arms, legs, and internal organs, like your stomach or kidneys. However, it most often affects a person's lower legs and feet. There are two types of PVD.  Organic PVD. This is the more common type. It is caused by damage to the structure of blood vessels.  Functional PVD. This is caused by conditions that make blood vessels contract and tighten (spasm).  Without treatment, PVD tends to get worse over time. PVD can also lead to acute ischemic limb. This is when an arm or limb suddenly has trouble getting enough blood. This is a medical emergency. Follow these instructions at home:  Take medicines only as told by your doctor.  Do not use any tobacco products, including cigarettes, chewing tobacco, or electronic cigarettes. If you need help quitting, ask your doctor.  Lose weight if you are overweight, and maintain a healthy weight as told by your doctor.  Eat a diet that is low in fat and cholesterol. If you need help, ask your doctor.  Exercise regularly. Ask your doctor for some good activities for you.  Take good care of your feet. ? Wear comfortable shoes that fit  well. ? Check your feet often for any cuts or sores. Contact a doctor if:  You have cramps in your legs while walking.  You have leg pain when you are at rest.  You have coldness in a leg or foot.  Your skin changes.  You are unable to get or have an erection (erectile dysfunction).  You have cuts or sores on your feet that are not healing. Get help right away if:  Your arm or leg turns cold and blue.  Your arms or legs become red, warm, swollen, painful, or numb.  You have chest pain or trouble breathing.  You suddenly have weakness in your face, arm, or leg.  You become very confused or you cannot speak.  You suddenly have a very bad headache.  You suddenly cannot see. This information is not intended to replace advice given to you by your health care provider. Make sure you discuss any questions you have with your health care provider. Document Released: 02/02/2010 Document Revised: 04/15/2016 Document Reviewed: 04/18/2014 Elsevier Interactive Patient Education  2017 Elsevier Inc.  

## 2018-06-27 ENCOUNTER — Other Ambulatory Visit: Payer: Self-pay | Admitting: Internal Medicine

## 2018-06-28 ENCOUNTER — Telehealth: Payer: Self-pay | Admitting: Internal Medicine

## 2018-06-28 ENCOUNTER — Other Ambulatory Visit: Payer: Self-pay | Admitting: Internal Medicine

## 2018-06-28 MED ORDER — LORAZEPAM 1 MG PO TABS: ORAL_TABLET | ORAL | 2 refills | Status: DC

## 2018-06-28 NOTE — Telephone Encounter (Signed)
States pharmacy did not receive script for lorazepam.  Please resend.

## 2018-06-28 NOTE — Addendum Note (Signed)
Addended by: Biagio Borg on: 06/28/2018 12:34 PM   Modules accepted: Orders

## 2018-06-28 NOTE — Telephone Encounter (Signed)
Ok this is resent erx

## 2018-09-21 ENCOUNTER — Other Ambulatory Visit: Payer: Self-pay | Admitting: Internal Medicine

## 2018-09-21 NOTE — Telephone Encounter (Signed)
   LOV:04/04/18 NextOV:04/10/19  Last Filled/Quantity:08/24/18

## 2018-10-13 ENCOUNTER — Encounter: Payer: Self-pay | Admitting: Internal Medicine

## 2018-10-13 ENCOUNTER — Ambulatory Visit (INDEPENDENT_AMBULATORY_CARE_PROVIDER_SITE_OTHER): Payer: Non-veteran care | Admitting: Internal Medicine

## 2018-10-13 VITALS — BP 124/80 | HR 71 | Temp 97.7°F | Ht 72.0 in | Wt 170.0 lb

## 2018-10-13 DIAGNOSIS — E785 Hyperlipidemia, unspecified: Secondary | ICD-10-CM | POA: Diagnosis not present

## 2018-10-13 DIAGNOSIS — I10 Essential (primary) hypertension: Secondary | ICD-10-CM

## 2018-10-13 DIAGNOSIS — E119 Type 2 diabetes mellitus without complications: Secondary | ICD-10-CM

## 2018-10-13 MED ORDER — LORAZEPAM 1 MG PO TABS: ORAL_TABLET | ORAL | 5 refills | Status: DC

## 2018-10-13 NOTE — Progress Notes (Signed)
Subjective:    Patient ID: Jesse Hansen, male    DOB: 12/03/1945, 72 y.o.   MRN: [id number]  HPI  Here for yearly f/u with wife present;  Overall doing ok;  Pt denies Chest pain, worsening SOB, DOE, wheezing, orthopnea, PND, worsening LE edema, palpitations, dizziness or syncope.  Pt denies neurological change such as new headache, facial or extremity weakness.  Pt denies polydipsia, polyuria, or low sugar symptoms. Pt states overall good compliance with treatment and medications, good tolerability, and has been trying to follow appropriate diet.  Pt denies worsening depressive symptoms, suicidal ideation or panic. No fever, night sweats, wt loss, loss of appetite, or other constitutional symptoms.  Pt states good ability with ADL's, has low fall risk, home safety reviewed and adequate, no other significant changes in hearing or vision, and only occasionally active with exercise. Now taking antihist for allergies so better in the past wk.  Also taking iron supplement B12 supplement after recent Nov 1 labs at [facility] with low b12 and mild iron deficiency anemia, has copy of labs for [place] today to scan in.  Currently has GI appt for f/u next week, likely due for EGD, has had colonoscopy within last 2 yrs Past Medical History:  Diagnosis Date  . Agent orange exposure   . Anxiety   . Carotid artery disease ([place])    a. s/p carotid endarterectomy remotely.  . Chronic systolic CHF (congestive heart failure) ([place])   . COLONIC POLYPS, HX OF 01/29/2011   Qualifier: Diagnosis of  By: Jenny Reichmann MD, Hunt Oris   . Coronary artery disease involving native coronary artery with angina pectoris ([place])    a. s/p Multiple PCIs. b. CABG x 3 in 2013 (SVG-OM1, SVG-Diag with freeLIMA-LAD from SVG-Diag hood) --> relook cath in ~2015 with at least 1 graft ~occluded by report.  . Diabetes mellitus with circulatory complication ([place])   . ERECTILE DYSFUNCTION 08/15/2007   Qualifier: Diagnosis of  By: Dance CMA ([place]), Kim    . Essential  hypertension   . Hyperlipidemia   . Insomnia   . LBBB (left bundle branch block)   . PTSD 07/30/2010   Qualifier: Diagnosis of  By: Jenny Reichmann MD, Hunt Oris   . PVD (peripheral vascular disease) ([place])    a. s/p AOBF and LCE in the 1990s.   Past Surgical History:  Procedure Laterality Date  . ABDOMINAL AORTIC ANEURYSM REPAIR  1996?   Done at [facility] by Dr. Donnetta Hutching  . CAROTID ENDARTERECTOMY Left    Dr. Volney American at [facility] in [place]   . CAROTID PTA/STENT INTERVENTION Left 03/24/2017   Procedure: Carotid PTA/Stent Intervention;  Surgeon: Serafina Mitchell, MD;  Location: [facility];  Service: Cardiovascular;  Laterality: Left;  . CORONARY ARTERY BYPASS GRAFT  2013   @ [place], [place] ([place] was contaminated) -- SVG-Diag with freeLIMA-LAD sewn to SVG hood, SVG-OM1  . CORONARY STENT PLACEMENT  2007  . LEFT HEART CATH AND CORS/GRAFTS ANGIOGRAPHY N/A 01/05/2018   Procedure: LEFT HEART CATH AND CORS/GRAFTS ANGIOGRAPHY;  Surgeon: Martinique, Peter M, MD;  Location: [facility];  Service: Cardiovascular;  Laterality: N/A;  . PROSTATE BIOPSY  2009    reports that he has been smoking cigarettes. He has never used smokeless tobacco. He reports that he drinks alcohol. He reports that he does not use drugs. family history includes Hypertension in his father. Allergies  Allergen Reactions  . Codeine Nausea And Vomiting  .  Nicotine     Nicotine Patch--Per Nurse "heart issues"   Current Outpatient Medications on File Prior to Visit  Medication Sig Dispense Refill  . acetaminophen (TYLENOL) 325 MG tablet Take 650 mg by mouth every 6 (six) hours as needed.    Marland Kitchen aspirin EC 81 MG tablet Take 1 tablet (81 mg total) by mouth daily. (Patient taking differently: Take 81 mg by mouth at bedtime. ) 90 tablet 11  . atorvastatin (LIPITOR) 80 MG tablet Take 40 mg by mouth every evening.     . carvedilol (COREG) 3.125 MG tablet Take 1 tablet (3.125 mg total) by mouth 2 (two) times daily with  a meal. 60 tablet 3  . cholecalciferol (VITAMIN D) 1000 units tablet Take 1,000 Units by mouth at bedtime.    . clopidogrel (PLAVIX) 75 MG tablet Take 75 mg by mouth daily.    . furosemide (LASIX) 20 MG tablet Take 1 tablet (20 mg total) by mouth daily. 30 tablet 3  . glipiZIDE (GLUCOTROL) 5 MG tablet Take 5 mg by mouth 2 (two) times daily before a meal. Take 30 minutes prior to meals.    Marland Kitchen losartan (COZAAR) 25 MG tablet Take 1 tablet (25 mg total) by mouth daily. 30 tablet 4  . metFORMIN (GLUCOPHAGE) 500 MG tablet Take 1 tablet (500 mg total) by mouth 2 (two) times daily with a meal. (Patient taking differently: Take 1,000 mg by mouth 2 (two) times daily with a meal. )    . nitroGLYCERIN (NITROSTAT) 0.4 MG SL tablet Place 0.4 mg under the tongue every 5 (five) minutes as needed for chest pain.    . potassium chloride 20 MEQ TBCR Take 20 mEq by mouth daily. 30 tablet 3  . ranitidine (ZANTAC) 150 MG tablet Take 150 mg by mouth 2 (two) times daily as needed for heartburn.     No current facility-administered medications on file prior to visit.    Review of Systems  Constitutional: Negative for other unusual diaphoresis or sweats HENT: Negative for ear discharge or swelling Eyes: Negative for other worsening visual disturbances Respiratory: Negative for stridor or other swelling  Gastrointestinal: Negative for worsening distension or other blood Genitourinary: Negative for retention or other urinary change Musculoskeletal: Negative for other MSK pain or swelling Skin: Negative for color change or other new lesions Neurological: Negative for worsening tremors and other numbness  Psychiatric/Behavioral: Negative for worsening agitation or other fatigue All other system neg per pt    Objective:   Physical Exam BP 124/80   Pulse 71   Temp 97.7 F (36.5 C) (Oral)   Ht 6' (1.829 m)   Wt 170 lb (77.1 kg)   SpO2 98%   BMI 23.06 kg/m  VS noted,  Constitutional: Pt appears in NAD HENT:  Head: NCAT.  Right Ear: External ear normal.  Left Ear: External ear normal.  Eyes: . Pupils are equal, round, and reactive to light. Conjunctivae and EOM are normal Nose: without d/c or deformity Neck: Neck supple. Gross normal ROM Cardiovascular: Normal rate and regular rhythm.   Pulmonary/Chest: Effort normal and breath sounds without rales or wheezing.  Abd:  Soft, NT, ND, + BS, no organomegaly Neurological: Pt is alert. At baseline orientation, motor grossly intact Skin: Skin is warm. No rashes, other new lesions, no LE edema Psychiatric: Pt behavior is normal without agitation , nervous No other exam findings Lab Results  Component Value Date   WBC 7.4 01/06/2018   HGB 11.9 (L) 01/06/2018  HCT 36.3 (L) 01/06/2018   PLT 164 01/06/2018   GLUCOSE 162 (H) 01/06/2018   CHOL 96 01/04/2018   TRIG 133 01/04/2018   HDL 23 (L) 01/04/2018   LDLCALC 46 01/04/2018   ALT 48 01/05/2018   AST 67 (H) 01/05/2018   NA 139 01/06/2018   K 3.2 (L) 01/06/2018   CL 105 01/06/2018   CREATININE 1.37 (H) 01/06/2018   BUN 24 (H) 01/06/2018   CO2 20 (L) 01/06/2018   TSH 1.139 01/04/2018   INR 1.16 01/05/2018   HGBA1C 6.4 (H) 01/04/2018          Assessment & Plan:

## 2018-10-13 NOTE — Patient Instructions (Signed)
Please continue all other medications as before, and refills have been done if requested.  Please have the pharmacy call with any other refills you may need.  Please continue your efforts at being more active, low cholesterol diet, and weight control.  Please keep your appointments with your specialists as you may have planned  Please return in 6 months, or sooner if needed 

## 2018-10-14 NOTE — Assessment & Plan Note (Signed)
stable overall by history and exam, recent data reviewed with pt, and pt to continue medical treatment as before,  to f/u any worsening symptoms or concerns  

## 2019-01-02 IMAGING — CT CT HEAD W/O CM
3 series · 16 of 47 positions shown, 19 images · non-contrast
Comparison: None.

CLINICAL DATA: Altered level of consciousness (LOC), unexplained

EXAM:
CT HEAD WITHOUT CONTRAST
TECHNIQUE: Contiguous axial images were obtained from the base of the skull
through the vertex without intravenous contrast.

[Series 2: head wo · axial · 0.43mm/px · z∈[+1576,+1711]mm · 10 of 33 slices shown, 13 images]
[im 3/33  brain]
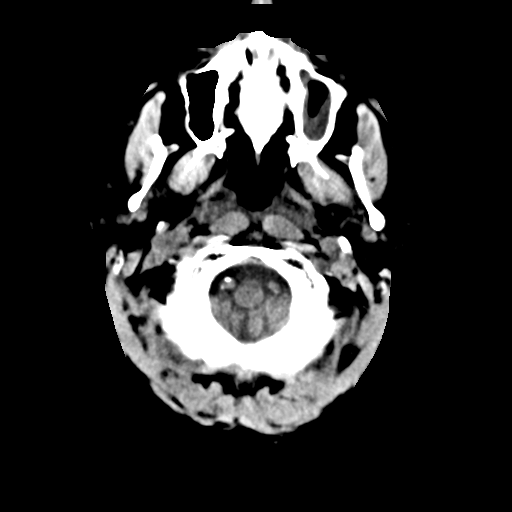
[im 3/33  bone]
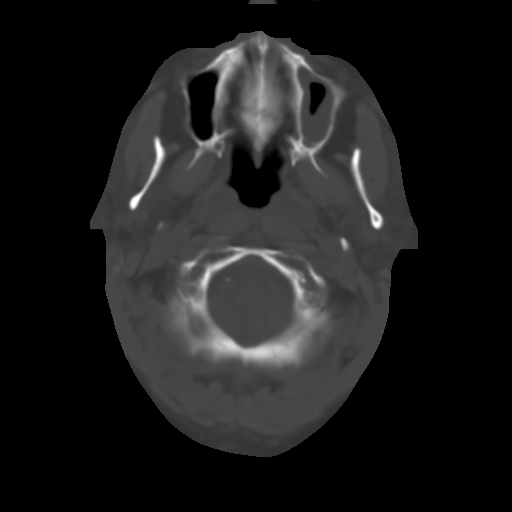
[im 6/33  brain]
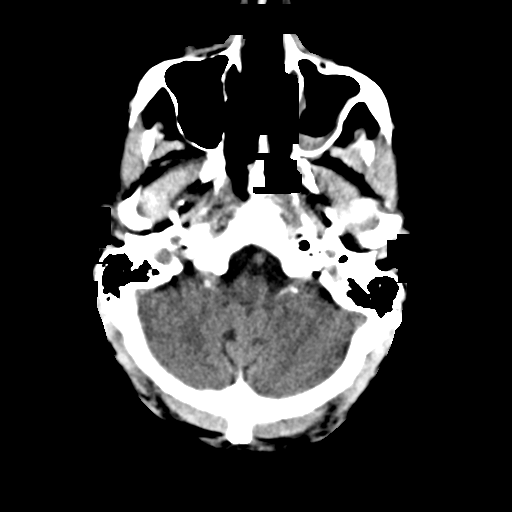
[im 9/33  brain]
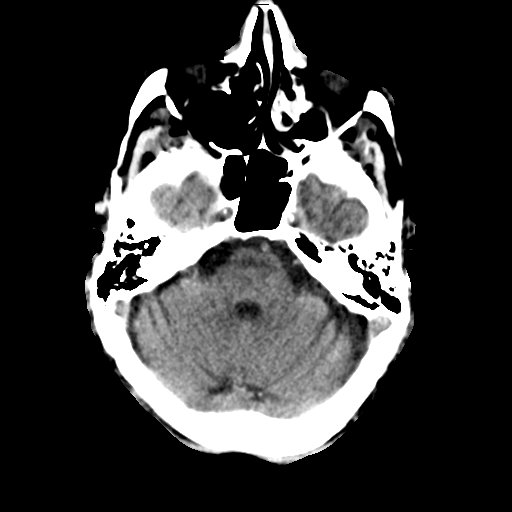
[im 12/33  brain]
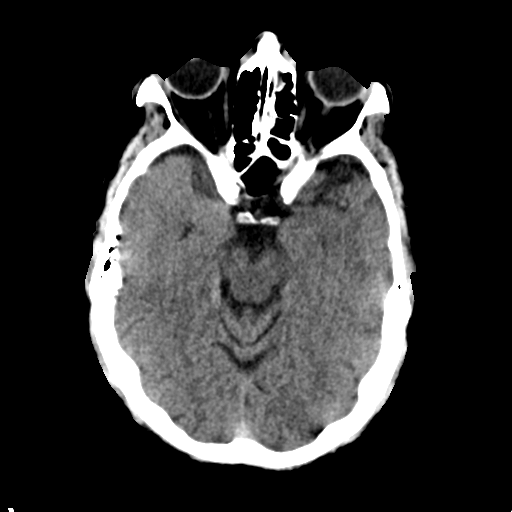
[im 15/33  brain]
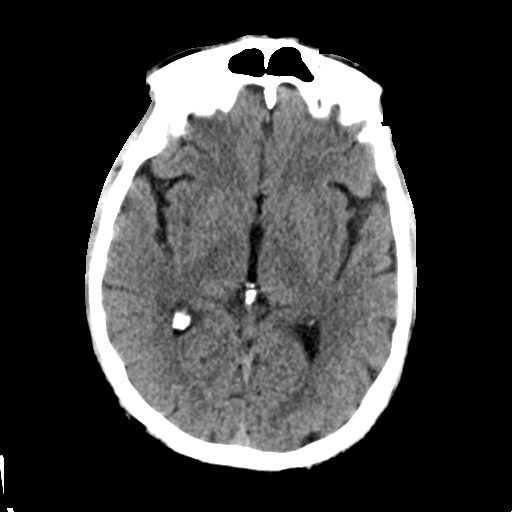
[im 15/33  bone]
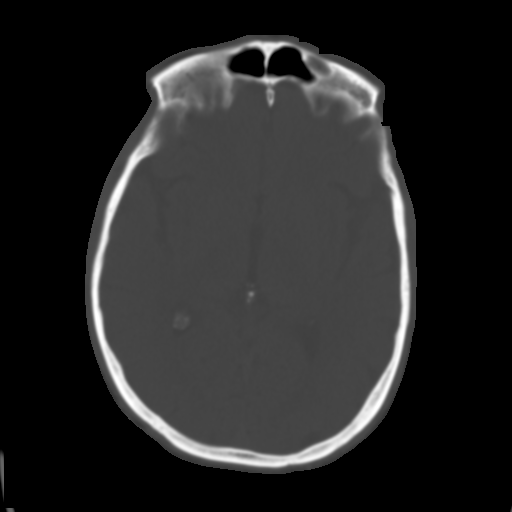
[im 18/33  brain]
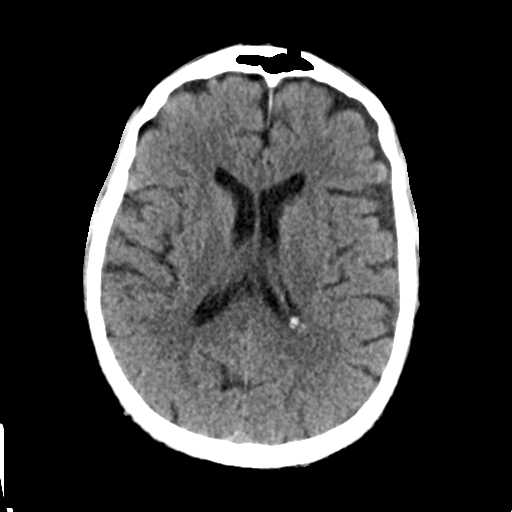
[im 21/33  brain]
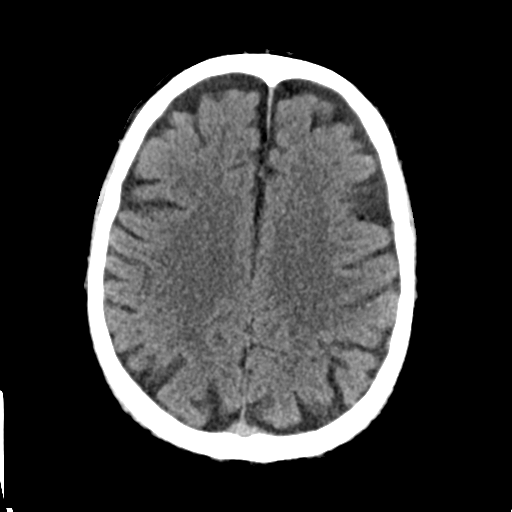
[im 25/33  brain]
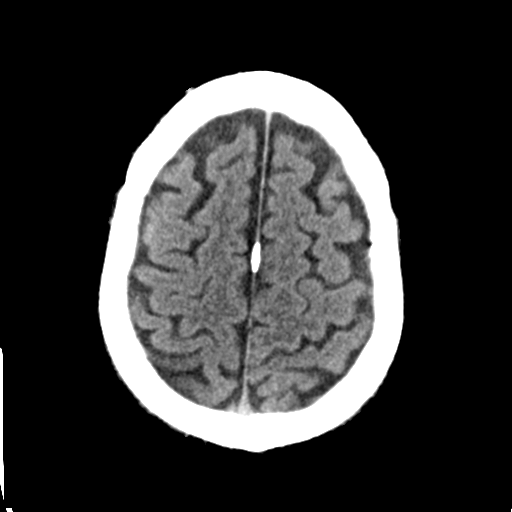
[im 27/33  brain]
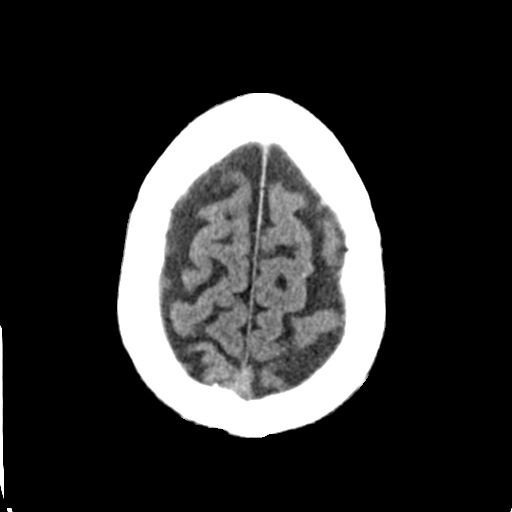
[im 27/33  bone]
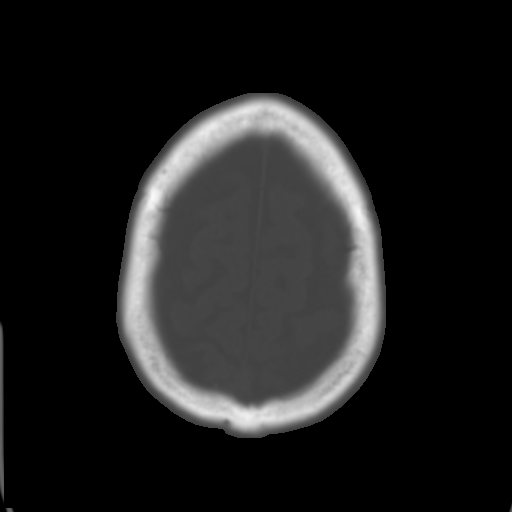
[im 30/33  brain]
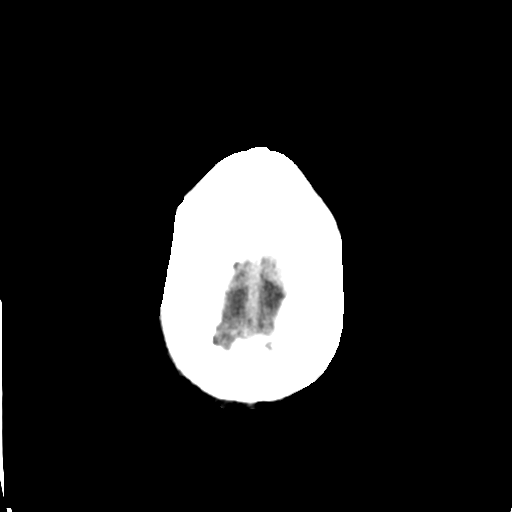

[Series 4: coronal soft tissue · coronal · 0.33mm/px · 3 of 69 slices shown]
[im 23/69  brain]
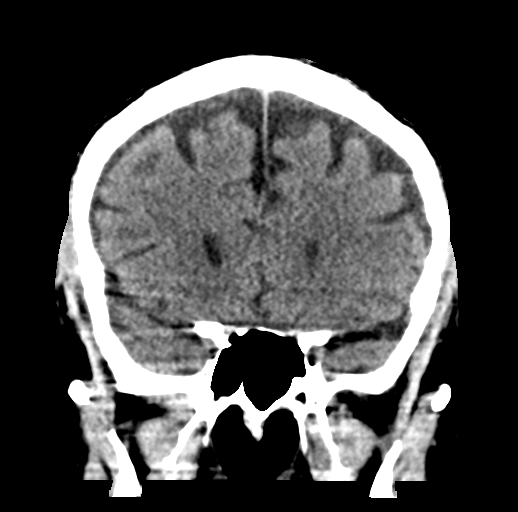
[im 31/69  brain]
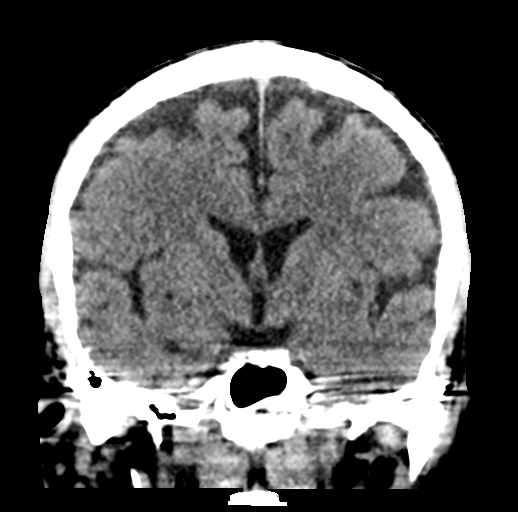
[im 38/69  brain]
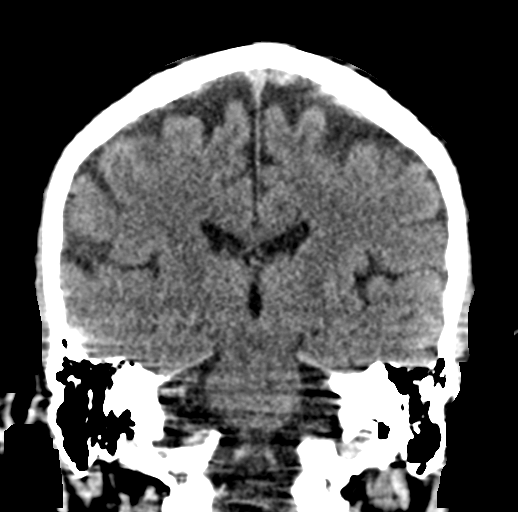

[Series 5: sagittal soft tissue · sagittal · 0.33mm/px · 3 of 58 slices shown]
[im 20/58  brain]
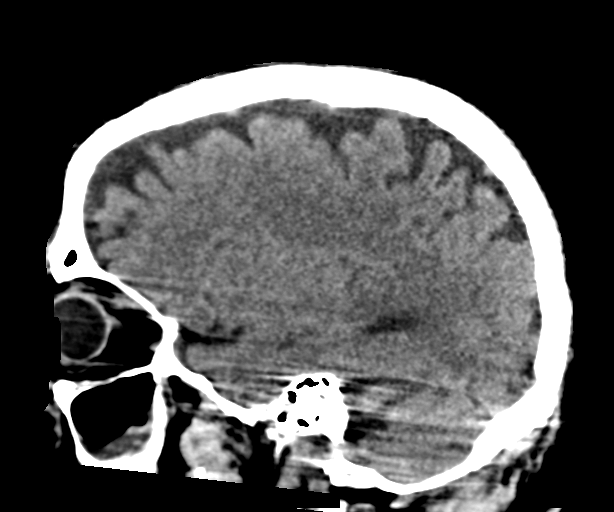
[im 29/58  brain]
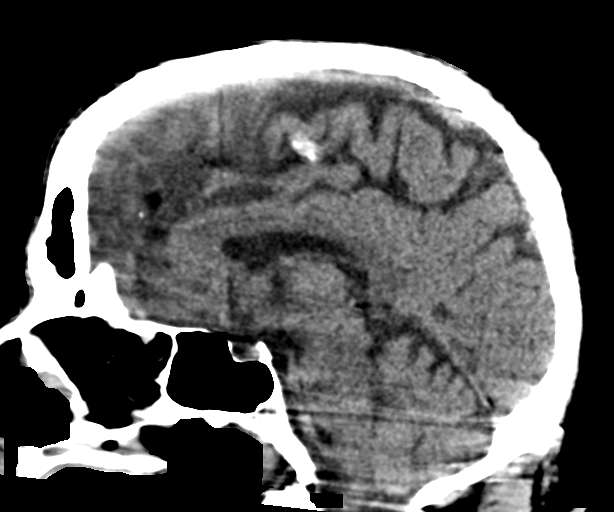
[im 39/58  brain]
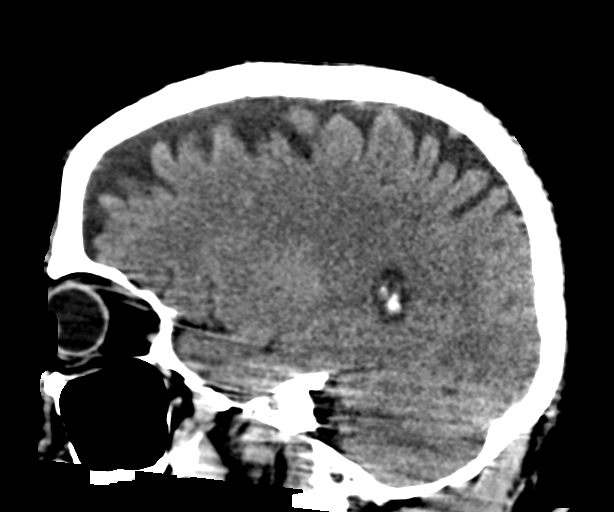

[16 of 47 positions shown; findings below may reference images not displayed]

FINDINGS: Brain: No acute intracranial hemorrhage. No focal mass lesion. No CT
evidence of acute infarction. No midline shift or mass effect. No
hydrocephalus. Basilar cisterns are patent.

There are periventricular and subcortical white matter
hypodensities. Generalized cortical atrophy.

Vascular: No hyperdense vessel or unexpected calcification.

Skull: Normal. Negative for fracture or focal lesion.

Sinuses/Orbits: Mucosal thickening in the LEFT maxillary sinus with
fluid level. Frontal sinuses are clear

Other: None
IMPRESSION: 1. No acute intracranial findings.
2. Mild atrophy and white matter disease.
3. LEFT maxillary sinusitis.

## 2019-01-02 IMAGING — DX DG CHEST 1V PORT
2 series · 2 of 2 positions shown · non-contrast
Comparison: 01/03/2018

CLINICAL DATA: Congestive failure and weakness

EXAM:
PORTABLE CHEST 1 VIEW

[chest ap (1 of 2)]
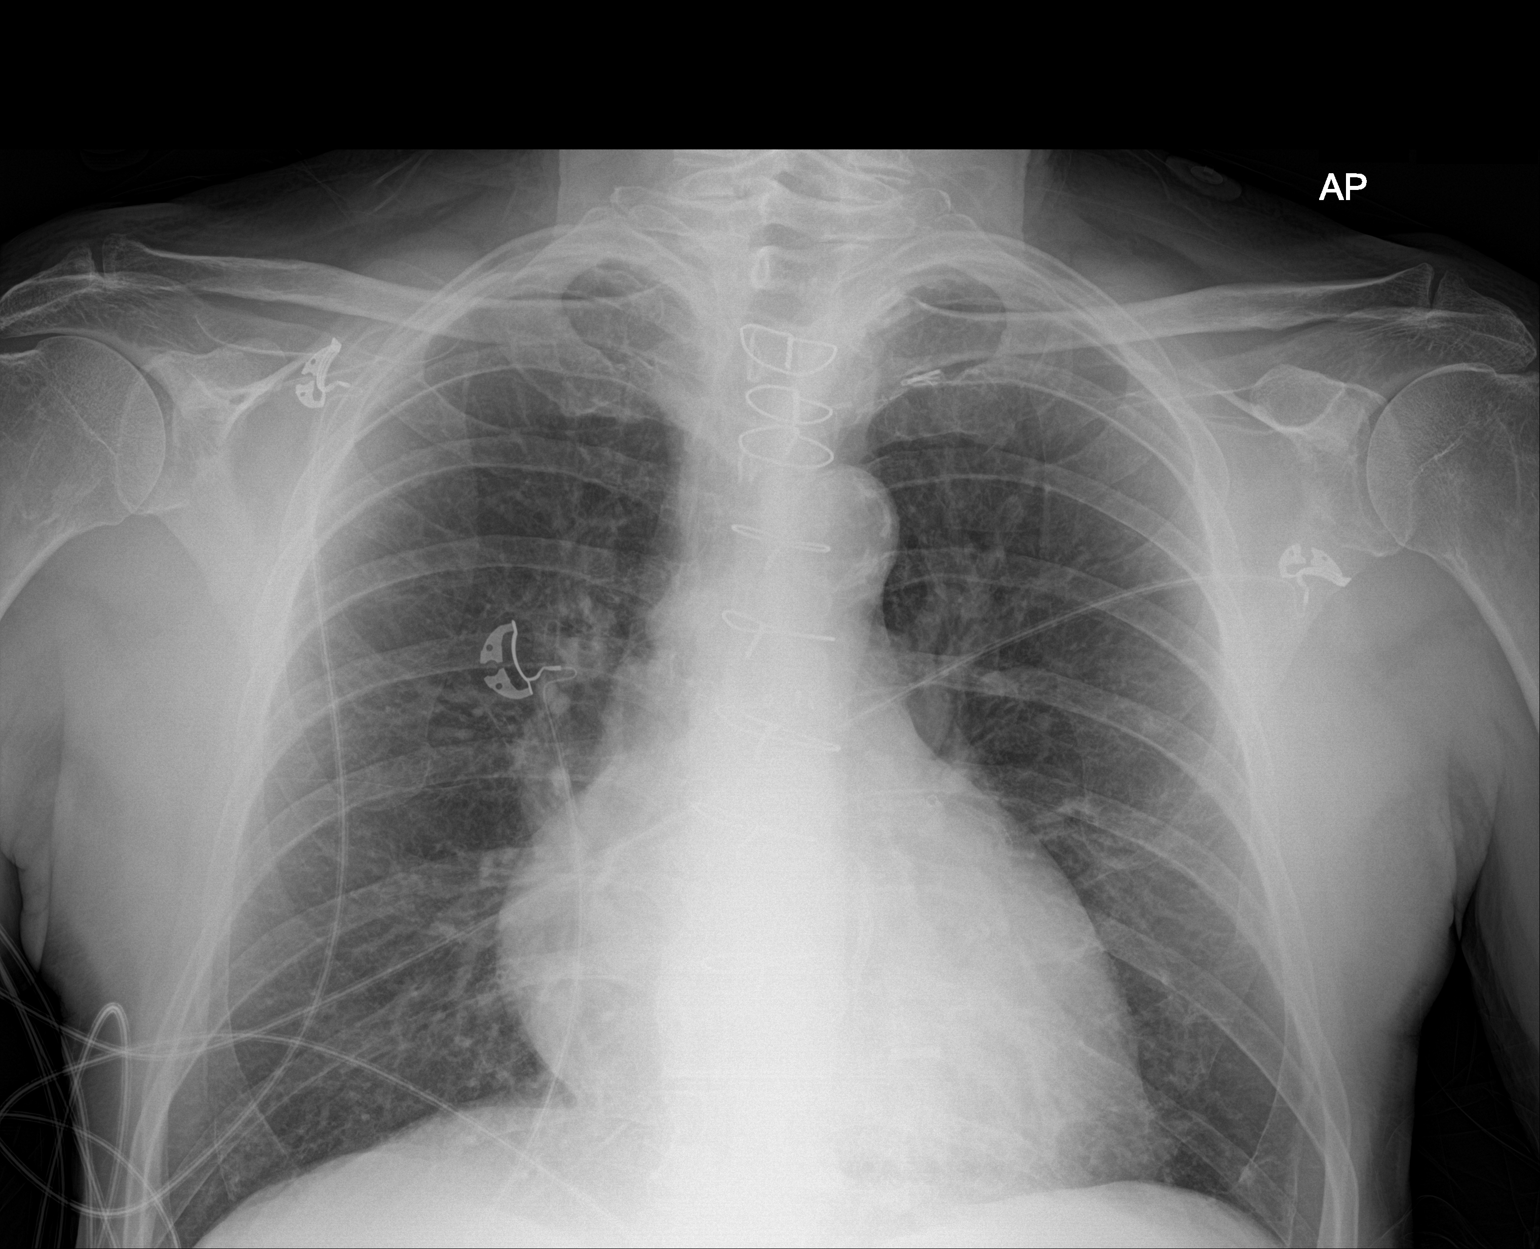

[chest ap (2 of 2)]
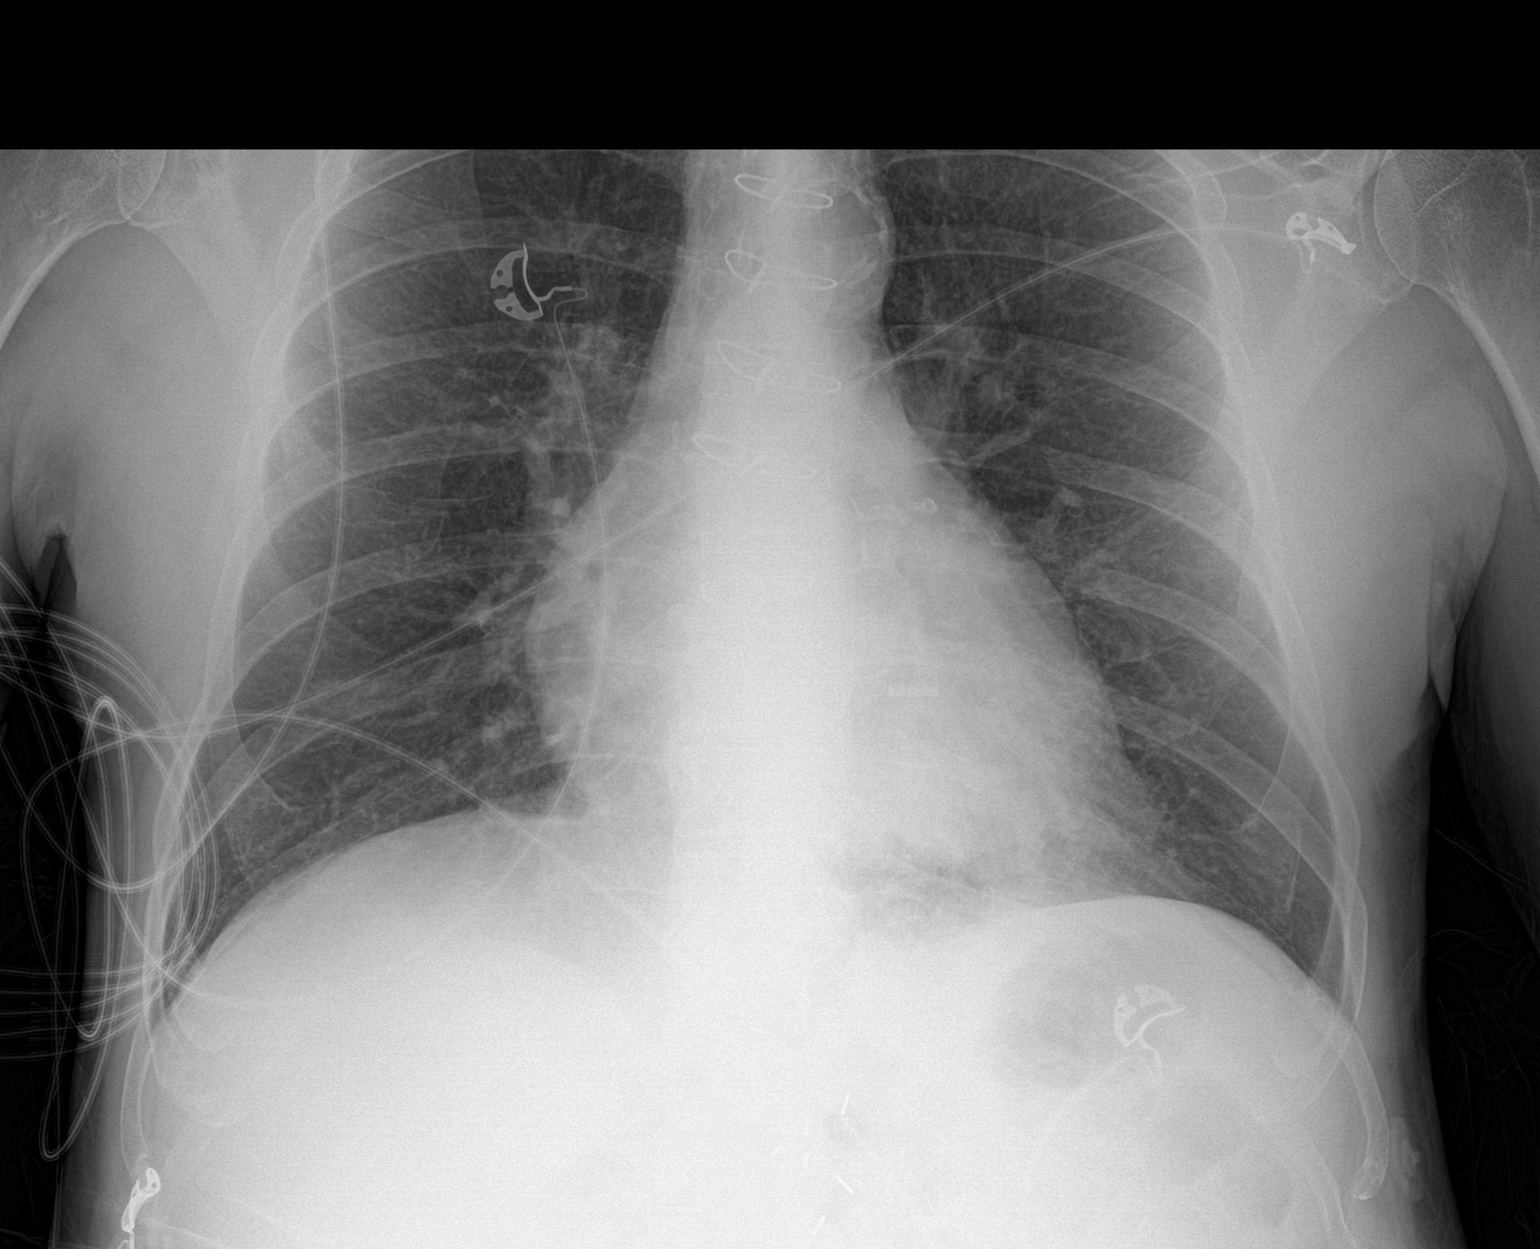

[2 of 2 positions shown; findings below may reference images not displayed]

FINDINGS: Cardiac shadow is stable. Postoperative changes are again seen.
Aortic calcifications are noted. No focal infiltrate or sizable
effusion is seen. No bony abnormality is noted.
IMPRESSION: No acute abnormality noted.

## 2019-04-10 ENCOUNTER — Ambulatory Visit: Payer: Non-veteran care | Admitting: Internal Medicine

## 2019-04-12 ENCOUNTER — Other Ambulatory Visit: Payer: Self-pay | Admitting: Internal Medicine

## 2019-04-12 NOTE — Telephone Encounter (Signed)
Done erx 

## 2019-10-10 ENCOUNTER — Other Ambulatory Visit: Payer: Self-pay

## 2019-10-10 ENCOUNTER — Ambulatory Visit (INDEPENDENT_AMBULATORY_CARE_PROVIDER_SITE_OTHER): Payer: No Typology Code available for payment source | Admitting: Internal Medicine

## 2019-10-10 DIAGNOSIS — I10 Essential (primary) hypertension: Secondary | ICD-10-CM | POA: Diagnosis not present

## 2019-10-10 DIAGNOSIS — F411 Generalized anxiety disorder: Secondary | ICD-10-CM | POA: Diagnosis not present

## 2019-10-10 DIAGNOSIS — E119 Type 2 diabetes mellitus without complications: Secondary | ICD-10-CM

## 2019-10-10 DIAGNOSIS — E785 Hyperlipidemia, unspecified: Secondary | ICD-10-CM | POA: Diagnosis not present

## 2019-10-10 MED ORDER — LORAZEPAM 1 MG PO TABS: ORAL_TABLET | ORAL | 5 refills | Status: DC

## 2019-10-10 NOTE — Progress Notes (Signed)
Patient ID: Jesse Hansen, male   DOB: [birth year], 73 y.o.   MRN: [id number]  Phone visit  Cumulative time during 7-day interval 13 min, there was not an associated office visit for this concern within a 7 day period. Verbal consent for services obtained from patient prior to services given.  Names of all persons present for services: Cathlean Cower, MD, patient  Chief complaint: anxiety  History, background, results pertinent:  ,Here to f/u; overall doing ok,  Pt denies chest pain, increasing sob or doe, wheezing, orthopnea, PND, increased LE swelling, palpitations, dizziness or syncope.  Pt denies new neurological symptoms such as new headache, or facial or extremity weakness or numbness.  Pt denies polydipsia, polyuria, or low sugar episode.  Pt states overall good compliance with meds, mostly trying to follow appropriate diet, with wt overall stable,  but little exercise however.  VS today - 98.2, BP 125/60, HR 60 per family.   Denies worsening depressive symptoms, suicidal ideation, or panic; has ongoing anxiety, asks for refill meds Past Medical History:  Diagnosis Date  . Agent orange exposure   . Anxiety   . Carotid artery disease ([place])    a. s/p carotid endarterectomy remotely.  . Chronic systolic CHF (congestive heart failure) ([place])   . COLONIC POLYPS, HX OF 01/29/2011   Qualifier: Diagnosis of  By: Jenny Reichmann MD, Hunt Oris   . Coronary artery disease involving native coronary artery with angina pectoris ([place])    a. s/p Multiple PCIs. b. CABG x 3 in 2013 (SVG-OM1, SVG-Diag with freeLIMA-LAD from SVG-Diag hood) --> relook cath in ~2015 with at least 1 graft ~occluded by report.  . Diabetes mellitus with circulatory complication ([place])   . ERECTILE DYSFUNCTION 08/15/2007   Qualifier: Diagnosis of  By: Dance CMA ([place]), Kim    . Essential hypertension   . Hyperlipidemia   . Insomnia   . LBBB (left bundle branch block)   . PTSD 07/30/2010   Qualifier: Diagnosis of  By: Jenny Reichmann MD, Hunt Oris   . PVD  (peripheral vascular disease) ([place])    a. s/p AOBF and LCE in the 1990s.   No results found for this or any previous visit (from the past 48 hour(s)). Lab Results  Component Value Date   WBC 7.4 01/06/2018   HGB 11.9 (L) 01/06/2018   HCT 36.3 (L) 01/06/2018   PLT 164 01/06/2018   GLUCOSE 162 (H) 01/06/2018   CHOL 96 01/04/2018   TRIG 133 01/04/2018   HDL 23 (L) 01/04/2018   LDLCALC 46 01/04/2018   ALT 48 01/05/2018   AST 67 (H) 01/05/2018   NA 139 01/06/2018   K 3.2 (L) 01/06/2018   CL 105 01/06/2018   CREATININE 1.37 (H) 01/06/2018   BUN 24 (H) 01/06/2018   CO2 20 (L) 01/06/2018   TSH 1.139 01/04/2018   INR 1.16 01/05/2018   HGBA1C 6.4 (H) 01/04/2018   Declines labs assoc with this visit for now  A/P/next steps:   1)  Anxiety - for med refill,  to f/u any worsening symptoms or concerns  Cathlean Cower MD

## 2019-10-14 ENCOUNTER — Encounter: Payer: Self-pay | Admitting: Internal Medicine

## 2019-10-14 NOTE — Assessment & Plan Note (Signed)
stable overall by history and exam, recent data reviewed with pt, and pt to continue medical treatment as before,  to f/u any worsening symptoms or concerns  

## 2019-10-14 NOTE — Patient Instructions (Signed)
Please continue all other medications as before, and refills have been done if requested.  Please have the pharmacy call with any other refills you may need.  Please continue your efforts at being more active, low cholesterol diet, and weight control.  Please keep your appointments with your specialists as you may have planned     

## 2019-12-12 ENCOUNTER — Ambulatory Visit: Payer: No Typology Code available for payment source

## 2019-12-13 ENCOUNTER — Encounter: Payer: No Typology Code available for payment source | Admitting: Internal Medicine

## 2020-03-31 ENCOUNTER — Other Ambulatory Visit: Payer: Self-pay | Admitting: Internal Medicine

## 2020-03-31 NOTE — Telephone Encounter (Signed)
Done erx 

## 2020-06-23 ENCOUNTER — Other Ambulatory Visit: Payer: Self-pay

## 2020-06-23 ENCOUNTER — Emergency Department (HOSPITAL_COMMUNITY): Payer: No Typology Code available for payment source

## 2020-06-23 ENCOUNTER — Inpatient Hospital Stay (HOSPITAL_COMMUNITY)
Admission: EM | Admit: 2020-06-23 | Discharge: 2020-06-24 | DRG: 311 | Disposition: A | Discharge status: Home / Self Care | Payer: No Typology Code available for payment source | Attending: Internal Medicine | Admitting: Internal Medicine

## 2020-06-23 DIAGNOSIS — F4310 Post-traumatic stress disorder, unspecified: Secondary | ICD-10-CM | POA: Diagnosis present

## 2020-06-23 DIAGNOSIS — Z951 Presence of aortocoronary bypass graft: Secondary | ICD-10-CM | POA: Diagnosis not present

## 2020-06-23 DIAGNOSIS — I447 Left bundle-branch block, unspecified: Secondary | ICD-10-CM | POA: Diagnosis present

## 2020-06-23 DIAGNOSIS — I5043 Acute on chronic combined systolic (congestive) and diastolic (congestive) heart failure: Secondary | ICD-10-CM | POA: Diagnosis present

## 2020-06-23 DIAGNOSIS — R079 Chest pain, unspecified: Principal | ICD-10-CM

## 2020-06-23 DIAGNOSIS — Z7982 Long term (current) use of aspirin: Secondary | ICD-10-CM

## 2020-06-23 DIAGNOSIS — Z955 Presence of coronary angioplasty implant and graft: Secondary | ICD-10-CM | POA: Diagnosis not present

## 2020-06-23 DIAGNOSIS — Z7902 Long term (current) use of antithrombotics/antiplatelets: Secondary | ICD-10-CM

## 2020-06-23 DIAGNOSIS — Z20822 Contact with and (suspected) exposure to covid-19: Secondary | ICD-10-CM | POA: Diagnosis present

## 2020-06-23 DIAGNOSIS — E1151 Type 2 diabetes mellitus with diabetic peripheral angiopathy without gangrene: Secondary | ICD-10-CM | POA: Diagnosis present

## 2020-06-23 DIAGNOSIS — I2510 Atherosclerotic heart disease of native coronary artery without angina pectoris: Secondary | ICD-10-CM | POA: Diagnosis present

## 2020-06-23 DIAGNOSIS — E119 Type 2 diabetes mellitus without complications: Secondary | ICD-10-CM | POA: Diagnosis not present

## 2020-06-23 DIAGNOSIS — Z79899 Other long term (current) drug therapy: Secondary | ICD-10-CM

## 2020-06-23 DIAGNOSIS — E1122 Type 2 diabetes mellitus with diabetic chronic kidney disease: Secondary | ICD-10-CM | POA: Diagnosis present

## 2020-06-23 DIAGNOSIS — I248 Other forms of acute ischemic heart disease: Secondary | ICD-10-CM | POA: Diagnosis not present

## 2020-06-23 DIAGNOSIS — N189 Chronic kidney disease, unspecified: Secondary | ICD-10-CM | POA: Diagnosis present

## 2020-06-23 DIAGNOSIS — I509 Heart failure, unspecified: Secondary | ICD-10-CM | POA: Diagnosis not present

## 2020-06-23 DIAGNOSIS — Z7984 Long term (current) use of oral hypoglycemic drugs: Secondary | ICD-10-CM

## 2020-06-23 DIAGNOSIS — E785 Hyperlipidemia, unspecified: Secondary | ICD-10-CM | POA: Diagnosis present

## 2020-06-23 DIAGNOSIS — I130 Hypertensive heart and chronic kidney disease with heart failure and stage 1 through stage 4 chronic kidney disease, or unspecified chronic kidney disease: Secondary | ICD-10-CM | POA: Diagnosis present

## 2020-06-23 DIAGNOSIS — Z8249 Family history of ischemic heart disease and other diseases of the circulatory system: Secondary | ICD-10-CM | POA: Diagnosis not present

## 2020-06-23 LAB — CBC
HCT: 42.4 % (ref 39.0–52.0)
Hemoglobin: 13.9 g/dL (ref 13.0–17.0)
MCH: 29.6 pg (ref 26.0–34.0)
MCHC: 32.8 g/dL (ref 30.0–36.0)
MCV: 90.4 fL (ref 80.0–100.0)
Platelets: 211 10*3/uL (ref 150–400)
RBC: 4.69 MIL/uL (ref 4.22–5.81)
RDW: 13.3 % (ref 11.5–15.5)
WBC: 8.1 10*3/uL (ref 4.0–10.5)
nRBC: 0 % (ref 0.0–0.2)

## 2020-06-23 LAB — BASIC METABOLIC PANEL
Anion gap: 10 (ref 5–15)
BUN: 25 mg/dL — ABNORMAL HIGH (ref 8–23)
CO2: 29 mmol/L (ref 22–32)
Calcium: 9.8 mg/dL (ref 8.9–10.3)
Chloride: 99 mmol/L (ref 98–111)
Creatinine, Ser: 1.5 mg/dL — ABNORMAL HIGH (ref 0.61–1.24)
GFR calc Af Amer: 52 mL/min — ABNORMAL LOW (ref >60)
GFR calc non Af Amer: 45 mL/min — ABNORMAL LOW (ref >60)
Glucose, Bld: 144 mg/dL — ABNORMAL HIGH (ref 70–99)
Potassium: 3.8 mmol/L (ref 3.5–5.1)
Sodium: 138 mmol/L (ref 135–145)

## 2020-06-23 LAB — SARS CORONAVIRUS 2 BY RT PCR (HOSPITAL ORDER, PERFORMED IN ~~LOC~~ HOSPITAL LAB): SARS Coronavirus 2: NEGATIVE

## 2020-06-23 LAB — HEMOGLOBIN A1C
Hgb A1c MFr Bld: 6.9 % — ABNORMAL HIGH (ref 4.8–5.6)
Mean Plasma Glucose: 151.33 mg/dL

## 2020-06-23 LAB — CBG MONITORING, ED: Glucose-Capillary: 210 mg/dL — ABNORMAL HIGH (ref 70–99)

## 2020-06-23 LAB — BRAIN NATRIURETIC PEPTIDE: B Natriuretic Peptide: 204 pg/mL — ABNORMAL HIGH (ref 0.0–100.0)

## 2020-06-23 LAB — TROPONIN I (HIGH SENSITIVITY)
Troponin I (High Sensitivity): 35 ng/L — ABNORMAL HIGH (ref <18)
Troponin I (High Sensitivity): 40 ng/L — ABNORMAL HIGH (ref <18)
Troponin I (High Sensitivity): 40 ng/L — ABNORMAL HIGH (ref <18)
Troponin I (High Sensitivity): 43 ng/L — ABNORMAL HIGH (ref <18)

## 2020-06-23 MED ORDER — ASPIRIN EC 81 MG PO TBEC: 81.0000 mg | DELAYED_RELEASE_TABLET | Freq: Every day | ORAL | Status: DC

## 2020-06-23 MED ORDER — INSULIN ASPART 100 UNIT/ML ~~LOC~~ SOLN
0.0000 [IU] | Freq: Three times a day (TID) | SUBCUTANEOUS | Status: DC
  Administered 2020-06-23: 5 [IU] via SUBCUTANEOUS
  Administered 2020-06-24: 2 [IU] via SUBCUTANEOUS

## 2020-06-23 MED ORDER — ALBUTEROL SULFATE HFA 108 (90 BASE) MCG/ACT IN AERS
1.0000 | INHALATION_SPRAY | Freq: Four times a day (QID) | RESPIRATORY_TRACT | Status: DC | PRN
  Filled 2020-06-23: qty 6.7

## 2020-06-23 MED ORDER — HEPARIN SODIUM (PORCINE) 5000 UNIT/ML IJ SOLN
5000.0000 [IU] | Freq: Three times a day (TID) | INTRAMUSCULAR | Status: DC
  Administered 2020-06-23 – 2020-06-24 (×2): 5000 [IU] via SUBCUTANEOUS
  Filled 2020-06-23 (×2): qty 1

## 2020-06-23 MED ORDER — CLOPIDOGREL BISULFATE 75 MG PO TABS
75.0000 mg | ORAL_TABLET | Freq: Every day | ORAL | Status: DC
  Administered 2020-06-24: 75 mg via ORAL
  Filled 2020-06-23: qty 1

## 2020-06-23 MED ORDER — CARVEDILOL 6.25 MG PO TABS
6.2500 mg | ORAL_TABLET | Freq: Two times a day (BID) | ORAL | Status: DC
  Administered 2020-06-24: 6.25 mg via ORAL
  Filled 2020-06-23: qty 1

## 2020-06-23 MED ORDER — ALBUTEROL SULFATE (2.5 MG/3ML) 0.083% IN NEBU: 2.5000 mg | INHALATION_SOLUTION | Freq: Four times a day (QID) | RESPIRATORY_TRACT | Status: DC | PRN

## 2020-06-23 MED ORDER — NITROGLYCERIN 0.4 MG SL SUBL: 0.4000 mg | SUBLINGUAL_TABLET | SUBLINGUAL | Status: DC | PRN

## 2020-06-23 MED ORDER — LOSARTAN POTASSIUM 25 MG PO TABS
25.0000 mg | ORAL_TABLET | Freq: Every day | ORAL | Status: DC
  Administered 2020-06-24: 25 mg via ORAL
  Filled 2020-06-23: qty 1

## 2020-06-23 MED ORDER — ACETAMINOPHEN 325 MG PO TABS: 650.0000 mg | ORAL_TABLET | ORAL | Status: DC | PRN

## 2020-06-23 MED ORDER — FUROSEMIDE 10 MG/ML IJ SOLN
80.0000 mg | Freq: Once | INTRAMUSCULAR | Status: AC
  Administered 2020-06-23: 80 mg via INTRAVENOUS
  Filled 2020-06-23: qty 8

## 2020-06-23 MED ORDER — ROSUVASTATIN CALCIUM 20 MG PO TABS
40.0000 mg | ORAL_TABLET | Freq: Every day | ORAL | Status: DC
  Administered 2020-06-23 – 2020-06-24 (×2): 40 mg via ORAL
  Filled 2020-06-23 (×2): qty 2

## 2020-06-23 MED ORDER — ONDANSETRON HCL 4 MG/2ML IJ SOLN: 4.0000 mg | Freq: Four times a day (QID) | INTRAMUSCULAR | Status: DC | PRN

## 2020-06-23 MED ORDER — SODIUM CHLORIDE 0.9% FLUSH
3.0000 mL | Freq: Once | INTRAVENOUS | Status: AC
  Administered 2020-06-23: 3 mL via INTRAVENOUS

## 2020-06-23 MED ORDER — LORAZEPAM 1 MG PO TABS
2.0000 mg | ORAL_TABLET | Freq: Every evening | ORAL | Status: DC | PRN
  Administered 2020-06-23: 2 mg via ORAL
  Filled 2020-06-23: qty 2

## 2020-06-23 MED ORDER — ASPIRIN EC 81 MG PO TBEC
81.0000 mg | DELAYED_RELEASE_TABLET | Freq: Every day | ORAL | Status: DC
  Administered 2020-06-23: 81 mg via ORAL
  Filled 2020-06-23: qty 1

## 2020-06-23 MED ORDER — POTASSIUM CHLORIDE ER 10 MEQ PO TBCR
20.0000 meq | EXTENDED_RELEASE_TABLET | Freq: Every day | ORAL | Status: DC
  Administered 2020-06-24: 20 meq via ORAL
  Filled 2020-06-23 (×2): qty 2

## 2020-06-23 NOTE — ED Notes (Addendum)
Please call his wife Lorna Few with any updates.

## 2020-06-23 NOTE — H&P (Signed)
Cardiology Admission History and Physical:   Patient ID: Jesse Hansen MRN: [id number]; DOB: [birth year]   Admission date: 06/23/2020  Primary Care Provider: Biagio Borg, MD [facility]  Chief Complaint:  Pt presents to [place] for eval of CP    Patient Profile:   Jesse Hansen is a 74 y.o. 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 Jesse Hansen  Is a 74 yo witn known CAD  He is s/p multiple PCIs and then CABG at [facility] in [place] in 2013 (SVG to OM1, SVG ti Diag; free LIMA to LAD)   RElook in 2015 showed occlusion of 1 graft.  Readmitted in 2017 with CHF LVEF 40% at the time   Admiitted in 2019 with influenza.  Tropin increased.  Echo done that showed LVEF 20 to 25%  With drop in LVEF he underwent L heart catheterization   This showe:  Severe 3V dz with 100% prox LAD; 100% prox D1; 100% midLCx (dominant vessel); 99% ostial RCA.  The free LIMA to LAD was patent with collaterals to other vessels; the SVG to D1 and SVG to OM were occluded.  Distal vessels were not good targets for revascularization.  Plan for medical Rx.   The pt follows in the [place] system    The pas 2 wks he has been under incresased stress due to brother dying The pt has had episodes of pain in [place] that comes and goes   Last  A few minutes  Then gone  Today coming in for car with groceries had discomfort   Took NTG  And pain eased  No discomfort at rest or in bed  With activity  He feels weak A few wks ago he was not like this   Notes some wheezing   No LE edema       He just saw his cardiologist in June 23   Saw PCP there 7/22   Plan for for carotid USN and LE doppler given  Past Medical History:  Diagnosis  Date  . Agent orange exposure   . Anxiety   . Carotid artery disease ([place])    a. s/p carotid endarterectomy remotely.  . Chronic systolic CHF (congestive heart failure) ([place])   . COLONIC POLYPS, HX OF 01/29/2011   Qualifier: Diagnosis of  By: Jenny Reichmann MD, Hunt Oris   . Coronary artery disease involving native coronary artery with angina pectoris ([place])    a. s/p Multiple PCIs. b. CABG x 3 in 2013 (SVG-OM1, SVG-Diag with freeLIMA-LAD from SVG-Diag hood) --> relook cath in ~2015 with at least 1 graft ~occluded by report.  . Diabetes mellitus with circulatory complication ([place])   . ERECTILE DYSFUNCTION 08/15/2007   Qualifier: Diagnosis of  By: Dance CMA ([place]), Kim    . Essential hypertension   . Hyperlipidemia   . Insomnia   . LBBB (left bundle branch block)   . PTSD 07/30/2010   Qualifier: Diagnosis of  By: Jenny Reichmann MD, Hunt Oris   . PVD (peripheral vascular disease) ([place])    a. s/p AOBF and LCE in the 1990s.    Past Surgical History:  Procedure Laterality Date  .  ABDOMINAL AORTIC ANEURYSM REPAIR  1996?   Done at [facility] by Dr. Donnetta Hutching  . CAROTID ENDARTERECTOMY Left    Dr. Volney American at [facility] in [place]   . CAROTID PTA/STENT INTERVENTION Left 03/24/2017   Procedure: Carotid PTA/Stent Intervention;  Surgeon: Serafina Mitchell, MD;  Location: [facility];  Service: Cardiovascular;  Laterality: Left;  . CORONARY ARTERY BYPASS GRAFT  2013   @ [place], [place] ([place] was contaminated) -- SVG-Diag with freeLIMA-LAD sewn to SVG hood, SVG-OM1  . CORONARY STENT PLACEMENT  2007  . LEFT HEART CATH AND CORS/GRAFTS ANGIOGRAPHY N/A 01/05/2018   Procedure: LEFT HEART CATH AND CORS/GRAFTS ANGIOGRAPHY;  Surgeon: Martinique, Peter M, MD;  Location: [facility];  Service: Cardiovascular;  Laterality: N/A;  . PROSTATE BIOPSY  2009     Medications Prior to Admission: Prior to Admission medications   Medication Sig Start Date End Date Taking? Authorizing Provider  acetaminophen (TYLENOL)  325 MG tablet Take 650 mg by mouth every 6 (six) hours as needed.   Yes [provider]  albuterol (VENTOLIN HFA) 108 (90 Base) MCG/ACT inhaler Inhale into the lungs every 6 (six) hours as needed for wheezing or shortness of breath.   Yes [provider]  Ascorbic Acid (VITAMIN C WITH ROSE HIPS) 500 MG tablet Take 500 mg by mouth daily.   Yes [provider]  aspirin EC 81 MG tablet Take 1 tablet (81 mg total) by mouth daily. Patient taking differently: Take 81 mg by mouth at bedtime.  05/15/14  Yes Biagio Borg, MD  carvedilol (COREG) 6.25 MG tablet Take 6.25 mg by mouth 2 (two) times daily with a meal.   Yes [provider]  cholecalciferol (VITAMIN D) 1000 units tablet Take 1,000 Units by mouth at bedtime.   Yes [provider]  clopidogrel (PLAVIX) 75 MG tablet Take 75 mg by mouth daily.   Yes [provider]  co-enzyme Q-10 50 MG capsule Take 50 mg by mouth daily.   Yes [provider]  ELDERBERRY PO Take 1 capsule by mouth daily.   Yes [provider]  furosemide (LASIX) 20 MG tablet Take 1 tablet (20 mg total) by mouth daily. 01/06/18  Yes Rai, Ripudeep K, MD  glipiZIDE (GLUCOTROL) 5 MG tablet Take 5 mg by mouth 2 (two) times daily before a meal. Take 30 minutes prior to meals.   Yes [provider]  LORazepam (ATIVAN) 1 MG tablet TAKE TWO TABLETS DAILY BY MOUTH AT BEDTIME 03/31/20  Yes Biagio Borg, MD  losartan (COZAAR) 25 MG tablet Take 1 tablet (25 mg total) by mouth daily. 01/06/18  Yes Rai, Ripudeep K, MD  metFORMIN (GLUCOPHAGE) 500 MG tablet Take 1 tablet (500 mg total) by mouth 2 (two) times daily with a meal. 03/26/17  Yes Trinh, Kimberly A, PA-C  nitroGLYCERIN (NITROSTAT) 0.4 MG SL tablet Place 0.4 mg under the tongue every 5 (five) minutes as needed for chest pain.   Yes [provider]  potassium chloride 20 MEQ TBCR Take 20 mEq by mouth daily. 01/06/18  Yes Rai, Ripudeep K, MD  rosuvastatin  (CRESTOR) 40 MG tablet Take 40 mg by mouth daily.   Yes [provider]  Zinc Sulfate (ZINC 15 PO) Take 1 Dose by mouth daily.   Yes [provider]  carvedilol (COREG) 3.125 MG tablet Take 1 tablet (3.125 mg total) by mouth 2 (two) times daily with a meal. Patient  not taking: Reported on 06/23/2020 01/06/18   Mendel Corning, MD     Allergies:    Allergies  Allergen Reactions  . Codeine Nausea And Vomiting  . Nicotine     Nicotine Patch--Per Nurse "heart issues"    Social History:   Social History   Socioeconomic History  . Marital status: Married    Spouse name: Not on file  . Number of children: Not on file  . Years of education: Not on file  . Highest education level: Not on file  Occupational History  . Not on file  Tobacco Use  . Smoking status: Current Every Day Smoker    Types: Cigarettes  . Smokeless tobacco: Never Used  . Tobacco comment: 4-8 cigarettes per day  Substance and Sexual Activity  . Alcohol use: Yes    Alcohol/week: 0.0 standard drinks  . Drug use: No  . Sexual activity: Not on file  Other Topics Concern  . Not on file  Social History Narrative  . Not on file   Social Determinants of Health   Financial Resource Strain:   . Difficulty of Paying Living Expenses:   Food Insecurity:   . Worried About Charity fundraiser in the Last Year:   . Arboriculturist in the Last Year:   Transportation Needs:   . Film/video editor (Medical):   Marland Kitchen Lack of Transportation (Non-Medical):   Physical Activity:   . Days of Exercise per Week:   . Minutes of Exercise per Session:   Stress:   . Feeling of Stress :   Social Connections:   . Frequency of Communication with Friends and Family:   . Frequency of Social Gatherings with Friends and Family:   . Attends Religious Services:   . Active Member of Clubs or Organizations:   . Attends Archivist Meetings:   Marland Kitchen Marital Status:   Intimate Partner Violence:   . Fear of Current or  Ex-Partner:   . Emotionally Abused:   Marland Kitchen Physically Abused:   . Sexually Abused:     Family History:   The patient's family history includes Hypertension in his father.    ROS:  Please see the history of present illness.  All other ROS reviewed and negative.     Physical Exam/Data:   Vitals:   06/23/20 1256 06/23/20 1600 06/23/20 1617 06/23/20 1618  BP: (!) 122/95 (!) 104/57 (!) 115/54   Pulse: (!) 57 (!) 53 (!) 56   Resp: 20 12 15    Temp: 98.1 F (36.7 C)     TempSrc: Oral     SpO2: 98% 99% 98%   Weight:    75.3 kg  Height:    6' (1.829 m)   No intake or output data in the 24 hours ending 06/23/20 1728 Last 3 Weights 06/23/2020 10/13/2018 04/25/2018  Weight (lbs) 166 lb 170 lb 171 lb 14.4 oz  Weight (kg) 75.297 kg 77.111 kg 77.973 kg     Body mass index is 22.51 kg/m.  General:  Well nourished, well developed, in no acute distress HEENT: normal Lymph: no adenopathy Neck: no JVD Endocrine:  No thryomegaly Vascular: No carotid bruits; 1 + DP  No bruits    Cardiac:  normal S1, S2; RRR; II/VI systolic murmur   No S3 Lungs:  clear to auscultation bilaterally, no wheezing, rhonchi or rales  Abd: soft, nontender, no hepatomegaly  Ext: Triv LE edema Musculoskeletal:  No deformities, BUE and BLE strength normal and  equal Skin: warm and dry  Neuro:  CNs 2-12 intact, no focal abnormalities noted Psych:  Normal affect    EKG:  The ECG that was done today was personally reviewed and demonstrates sinus bradycardia 59 bpm   LBBB   (old)  Relevant CV Studies: Cardiac Cath  01/05/18  Ost RCA to Prox RCA lesion is 99% stenosed.  Prox LAD to Mid LAD lesion is 100% stenosed.  Ost 1st Diag to 1st Diag lesion is 100% stenosed.  Prox Cx to Mid Cx lesion is 100% stenosed.  Dist Cx lesion is 100% stenosed.  SVG.  Origin to Prox Graft lesion is 100% stenosed.  SVG.  Origin to Prox Graft lesion is 100% stenosed.  LIMA graft was visualized by angiography and is normal in  caliber.  The graft exhibits no disease.  LV end diastolic pressure is moderately elevated.   1. Severe 3 vessel occlusive CAD    - 100% proximal LAD    - 100% proximal first diagonal    - 100% mid LCx after OM1. This is a large dominant vessel    - 99% ostial nondominant RCA 2. Patent free LIMA to the LAD. This supplies collaterals to other vessels. 3. Occluded SVG to first diagonal 4. Occluded SVG to OM 5. Moderately elevated LVEDP   Echo 01/03/18   - Left ventricle: The cavity size was normal. Wall thickness was  normal. Systolic function was severely reduced. The estimated  ejection fraction was in the range of 20% to 25%. Diffuse  hypokinesis. Features are consistent with a pseudonormal left  ventricular filling pattern, with concomitant abnormal relaxation  and increased filling pressure (grade 2 diastolic dysfunction).  - Mitral valve: Calcified annulus. There was mild regurgitation.  - Left atrium: The atrium was mildly dilated.   Laboratory Data:  High Sensitivity Troponin:   Recent Labs  Lab 06/23/20 1319 06/23/20 1615  TROPONINIHS 35* 40*      Chemistry Recent Labs  Lab 06/23/20 1319  NA 138  K 3.8  CL 99  CO2 29  GLUCOSE 144*  BUN 25*  CREATININE 1.50*  CALCIUM 9.8  GFRNONAA 45*  GFRAA 52*  ANIONGAP 10    No results for input(s): PROT, ALBUMIN, AST, ALT, ALKPHOS, BILITOT in the last 168 hours. Hematology Recent Labs  Lab 06/23/20 1319  WBC 8.1  RBC 4.69  HGB 13.9  HCT 42.4  MCV 90.4  MCH 29.6  MCHC 32.8  RDW 13.3  PLT 211   BNPNo results for input(s): BNP, PROBNP in the last 168 hours.  DDimer No results for input(s): DDIMER in the last 168 hours.   Radiology/Studies:  DG Chest 2 View  Result Date: 06/23/2020 CLINICAL DATA:  Chest pain EXAM: CHEST - 2 VIEW COMPARISON:  2019 FINDINGS: The heart size and mediastinal contours are within normal limits. Post post coronary stenting and CABG. Both lungs are clear. No pleural  effusion or pneumothorax. The visualized skeletal structures are unremarkable. Surgical clips overlie the upper abdomen. IMPRESSION: No acute process in the chest. Electronically Signed   By: Macy Mis M.D.   On: 06/23/2020 13:36     Assessment and Plan:   1. Chest pain/pressure   Pt has over the past few wks had episodes of chest pain/pressure  Short lived   Helped with NTG  He takes aobut 3 per day   New for him   Also complains of increased weakness  Note patient is under increased mental stress since death of  brother    Overall a change from about 4 t o6 wks ago On exma, He has mild wheezing bilaterally  Triv edema    EKG is nondiagnostic due to baseline LBBB Troponins are trivially elevated     Plan:     Pt with severe diffuse coronary artery dz.  Not bypassable or amenable for intervention at time of cath   Only vessel left to intervene would be free LIMA to LAD Plan to admit  Cycle troponins further  Give IV lasix x 1  Follow response   If no signif trend in troponin try to push medical Rx     2  CHF   Severe LV dysfunction on echo   Plan to give lasix x 1  FOllow response  3  DM   Will need to follow glucoses  4   HL  Will need to follow     Severity of Illness:  For questions or updates, please contact [place] Please consult [email or web address] for contact info under     Signed, Dorris Carnes, MD  06/23/2020 5:28 PM

## 2020-06-23 NOTE — ED Provider Notes (Signed)
[facility] EMERGENCY DEPARTMENT Provider Note   CSN: [id number] Arrival date & time: 06/23/20  1237     History Chief Complaint  Patient presents with  . Chest Pain    Jesse Hansen is a 74 y.o. male.  The history is provided by the patient.  Chest Pain Pain location:  L chest Pain quality: stabbing   Pain radiates to:  Does not radiate Pain severity:  Mild Onset quality:  Gradual Duration:  2 weeks Timing:  Intermittent Progression:  Waxing and waning Chronicity:  Recurrent Context: stress   Relieved by:  Nothing Worsened by:  Nothing Ineffective treatments:  None tried Associated symptoms: no abdominal pain, no back pain, no cough, no fever, no palpitations, no shortness of breath and no vomiting   Risk factors: male sex    74 yo M w/ h/o multiple cardiac stents, DM2, HFrEF p/w cp.  Pt states his brother died approx 10d ago and since then he has had intermittent exertional chest pain that is none radiating and is not a/w n/v/ diaphoresis.  Most recent episode happened today which prompted him to take 3 NGT and call his doctor at the [place] [facility] who encouraged him to present to the ED for eval.  Pt is currently cp free.  Denies f/c, SOB, abd pain.   Past Medical History:  Diagnosis Date  . Agent orange exposure   . Anxiety   . Carotid artery disease ([place])    a. s/p carotid endarterectomy remotely.  . Chronic systolic CHF (congestive heart failure) ([place])   . COLONIC POLYPS, HX OF 01/29/2011   Qualifier: Diagnosis of  By: Jenny Reichmann MD, Hunt Oris   . Coronary artery disease involving native coronary artery with angina pectoris ([place])    a. s/p Multiple PCIs. b. CABG x 3 in 2013 (SVG-OM1, SVG-Diag with freeLIMA-LAD from SVG-Diag hood) --> relook cath in ~2015 with at least 1 graft ~occluded by report.  . Diabetes mellitus with circulatory complication ([place])   . ERECTILE DYSFUNCTION 08/15/2007   Qualifier: Diagnosis of  By: Dance CMA ([place]), Kim    . Essential  hypertension   . Hyperlipidemia   . Insomnia   . LBBB (left bundle branch block)   . PTSD 07/30/2010   Qualifier: Diagnosis of  By: Jenny Reichmann MD, Hunt Oris   . PVD (peripheral vascular disease) ([place])    a. s/p AOBF and LCE in the 1990s.    Patient Active Problem List   Diagnosis Date Noted  . Chest pain 06/23/2020  . Non-ST elevation (NSTEMI) myocardial infarction ([place])   . Tobacco abuse 01/03/2018  . Influenza 01/03/2018  . Lactic acidosis 01/03/2018  . Hypokalemia 01/03/2018  . Hypotension 01/03/2018  . AKI (acute kidney injury) ([place]) 01/02/2018  . Carotid stenosis 03/24/2017  . Cardiomyopathy, ischemic 01/27/2016  . Aortic stenosis, mild 01/27/2016  . CAP (community acquired pneumonia) 01/26/2016  . COPD exacerbation ([place]) 01/26/2016  . Acute congestive heart failure ([place]) 01/26/2016  . Hx of CABG x 3 2013 01/26/2016  . Agent orange exposure 01/26/2016  . LBBB (left bundle branch block) 01/26/2016  . Chronic stable angina ([place]) 01/26/2016  . Elevated troponin 01/25/2016  . Insomnia 03/02/2013  . Preventative health care 02/28/2012  . Diabetes ([place]) 01/29/2011  . COLONIC POLYPS, HX OF 01/29/2011  . PTSD 07/30/2010  . Anxiety state 08/23/2009  . Hyperlipidemia 02/23/2008  . PERIPHERAL VASCULAR DISEASE 02/23/2008  . INSOMNIA-SLEEP DISORDER-UNSPEC 02/23/2008  . Coronary artery disease involving native coronary artery with  angina pectoris ([place]) 02/23/2008  . ERECTILE DYSFUNCTION 08/15/2007  . Essential hypertension 08/15/2007    Past Surgical History:  Procedure Laterality Date  . ABDOMINAL AORTIC ANEURYSM REPAIR  1996?   Done at [facility] by Dr. Donnetta Hutching  . CAROTID ENDARTERECTOMY Left    Dr. Volney American at [facility] in [place]   . CAROTID PTA/STENT INTERVENTION Left 03/24/2017   Procedure: Carotid PTA/Stent Intervention;  Surgeon: Serafina Mitchell, MD;  Location: [facility] CV LAB;  Service: Cardiovascular;  Laterality: Left;  . CORONARY ARTERY BYPASS GRAFT  2013   @ [place], [place] ([place] was contaminated) -- SVG-Diag with freeLIMA-LAD sewn to SVG hood, SVG-OM1  . CORONARY STENT PLACEMENT  2007  . LEFT HEART CATH AND CORS/GRAFTS ANGIOGRAPHY N/A 01/05/2018   Procedure: LEFT HEART CATH AND CORS/GRAFTS ANGIOGRAPHY;  Surgeon: Martinique, Peter M, MD;  Location: [facility] LAB;  Service: Cardiovascular;  Laterality: N/A;  . PROSTATE BIOPSY  2009       Family History  Problem Relation Age of Onset  . Hypertension Father     Social History   Tobacco Use  . Smoking status: Current Every Day Smoker    Packs/day: 0.50    Types: Cigarettes  . Smokeless tobacco: Never Used  . Tobacco comment: 4-8 cigarettes per day  Substance Use Topics  . Alcohol use: Not Currently    Alcohol/week: 0.0 standard drinks  . Drug use: No    Home Medications Prior to Admission medications   Medication Sig Start Date End Date Taking? Authorizing Provider  acetaminophen (TYLENOL) 325 MG tablet Take 650 mg by mouth every 6 (six) hours as needed.   Yes [provider]  albuterol (VENTOLIN HFA) 108 (90 Base) MCG/ACT inhaler Inhale into the lungs every 6 (six) hours as needed for wheezing or shortness of breath.   Yes [provider]  Ascorbic Acid (VITAMIN C WITH ROSE HIPS) 500 MG tablet Take 500 mg by mouth daily.   Yes [provider]  aspirin EC 81 MG tablet Take 1 tablet (81 mg total) by mouth daily. Patient taking differently: Take 81 mg by mouth at bedtime.  05/15/14  Yes Biagio Borg, MD  carvedilol (COREG) 6.25 MG tablet Take 6.25 mg by mouth 2 (two) times daily with a meal.   Yes [provider]  cholecalciferol (VITAMIN D) 1000 units tablet Take 1,000 Units by mouth at bedtime.   Yes [provider]  clopidogrel (PLAVIX) 75 MG tablet Take 75 mg by mouth daily.   Yes [provider]  co-enzyme Q-10 50 MG capsule Take 50 mg by mouth daily.   Yes [provider]  ELDERBERRY PO Take 1 capsule by  mouth daily.   Yes [provider]  glipiZIDE (GLUCOTROL) 5 MG tablet Take 5 mg by mouth 2 (two) times daily before a meal. Take 30 minutes prior to meals.   Yes [provider]  LORazepam (ATIVAN) 1 MG tablet TAKE TWO TABLETS DAILY BY MOUTH AT BEDTIME 03/31/20  Yes Biagio Borg, MD  losartan (COZAAR) 25 MG tablet Take 1 tablet (25 mg total) by mouth daily. 01/06/18  Yes Rai, Ripudeep K, MD  metFORMIN (GLUCOPHAGE) 500 MG tablet Take 1 tablet (500 mg total) by mouth 2 (two) times daily with a meal. 03/26/17  Yes Trinh, Kimberly A, PA-C  nitroGLYCERIN (NITROSTAT) 0.4 MG SL tablet Place 0.4 mg under the tongue every 5 (five) minutes as needed for chest pain.  Yes [provider]  rosuvastatin (CRESTOR) 40 MG tablet Take 40 mg by mouth daily.   Yes [provider]  Zinc Sulfate (ZINC 15 PO) Take 1 Dose by mouth daily.   Yes [provider]  furosemide (LASIX) 40 MG tablet Take 1 tablet (40 mg total) by mouth daily. 06/24/20   Kathyrn Drown D, NP  potassium chloride (KLOR-CON) 10 MEQ tablet Take 1 tablet (10 mEq total) by mouth daily. 06/25/20   Kathyrn Drown D, NP    Allergies    Codeine and Nicotine  Review of Systems   Review of Systems  Constitutional: Negative for chills and fever.  HENT: Negative for ear pain and sore throat.   Eyes: Negative for pain and visual disturbance.  Respiratory: Negative for cough and shortness of breath.   Cardiovascular: Positive for chest pain. Negative for palpitations.  Gastrointestinal: Negative for abdominal pain and vomiting.  Genitourinary: Negative for dysuria and hematuria.  Musculoskeletal: Negative for arthralgias and back pain.  Skin: Negative for color change and rash.  Neurological: Negative for seizures and syncope.  All other systems reviewed and are negative.   Physical Exam Updated Vital Signs BP (!) 108/59 (BP Location: Right Arm)   Pulse (!) 55   Temp 98.8 F (37.1 C) (Oral)   Resp 15   Ht  6' (1.829 m)   Wt 71.4 kg   SpO2 99%   BMI 21.33 kg/m   Physical Exam Vitals and nursing note reviewed.  Constitutional:      Appearance: He is well-developed.  HENT:     Head: Normocephalic and atraumatic.  Eyes:     Conjunctiva/sclera: Conjunctivae normal.  Cardiovascular:     Rate and Rhythm: Normal rate and regular rhythm.     Heart sounds: No murmur heard.   Pulmonary:     Effort: Pulmonary effort is normal. No respiratory distress.     Breath sounds: Normal breath sounds.  Chest:     Chest wall: No tenderness.  Abdominal:     Palpations: Abdomen is soft.     Tenderness: There is no abdominal tenderness.  Musculoskeletal:     Cervical back: Neck supple.     Right lower leg: No edema.     Left lower leg: No edema.  Skin:    General: Skin is warm and dry.  Neurological:     Mental Status: He is alert and oriented to person, place, and time.     Cranial Nerves: No cranial nerve deficit.     Motor: No weakness.  Psychiatric:        Mood and Affect: Mood normal.        Behavior: Behavior normal.     ED Results / Procedures / Treatments   Labs (all labs ordered are listed, but only abnormal results are displayed) Labs Reviewed  BASIC METABOLIC PANEL - Abnormal; Notable for the following components:      Result Value   Glucose, Bld 144 (*)    BUN 25 (*)    Creatinine, Ser 1.50 (*)    GFR calc non Af Amer 45 (*)    GFR calc Af Amer 52 (*)    All other components within normal limits  HEMOGLOBIN A1C - Abnormal; Notable for the following components:   Hgb A1c MFr Bld 6.9 (*)    All other components within normal limits  BRAIN NATRIURETIC PEPTIDE - Abnormal; Notable for the following components:   B Natriuretic Peptide 204.0 (*)    All  other components within normal limits  BASIC METABOLIC PANEL - Abnormal; Notable for the following components:   Potassium 3.3 (*)    Glucose, Bld 141 (*)    Creatinine, Ser 1.42 (*)    GFR calc non Af Amer 48 (*)    GFR calc  Af Amer 56 (*)    All other components within normal limits  CBG MONITORING, ED - Abnormal; Notable for the following components:   Glucose-Capillary 210 (*)    All other components within normal limits  TROPONIN I (HIGH SENSITIVITY) - Abnormal; Notable for the following components:   Troponin I (High Sensitivity) 35 (*)    All other components within normal limits  TROPONIN I (HIGH SENSITIVITY) - Abnormal; Notable for the following components:   Troponin I (High Sensitivity) 40 (*)    All other components within normal limits  TROPONIN I (HIGH SENSITIVITY) - Abnormal; Notable for the following components:   Troponin I (High Sensitivity) 40 (*)    All other components within normal limits  TROPONIN I (HIGH SENSITIVITY) - Abnormal; Notable for the following components:   Troponin I (High Sensitivity) 43 (*)    All other components within normal limits  SARS CORONAVIRUS 2 BY RT PCR [facility] ORDER, [place] [facility])  CBC  CBC    EKG EKG Interpretation  Date/Time:  Monday June 23 2020 13:16:02 EDT Ventricular Rate:  59 PR Interval:  186 QRS Duration: 150 QT Interval:  436 QTC Calculation: 431 R Axis:   2 Text Interpretation: Sinus bradycardia Left bundle branch block Non-specific ST-t changes `LBBB noted on prior ecgs Confirmed by Lajean Saver [phone]) on 06/23/2020 1:24:30 PM   Radiology DG Chest 2 View  Result Date: 06/23/2020 CLINICAL DATA:  Chest pain EXAM: CHEST - 2 VIEW COMPARISON:  2019 FINDINGS: The heart size and mediastinal contours are within normal limits. Post post coronary stenting and CABG. Both lungs are clear. No pleural effusion or pneumothorax. The visualized skeletal structures are unremarkable. Surgical clips overlie the upper abdomen. IMPRESSION: No acute process in the chest. Electronically Signed   By: Macy Mis M.D.   On: 06/23/2020 13:36    Procedures Procedures (including critical care time)  Medications Ordered in  ED Medications  aspirin EC tablet 81 mg (81 mg Oral Given 06/23/20 2133)  losartan (COZAAR) tablet 25 mg (25 mg Oral Given 06/24/20 0815)  rosuvastatin (CRESTOR) tablet 40 mg (40 mg Oral Given 06/24/20 0816)  LORazepam (ATIVAN) tablet 2 mg (2 mg Oral Given 06/23/20 2259)  clopidogrel (PLAVIX) tablet 75 mg (75 mg Oral Given 06/24/20 0815)  nitroGLYCERIN (NITROSTAT) SL tablet 0.4 mg (has no administration in time range)  acetaminophen (TYLENOL) tablet 650 mg (has no administration in time range)  ondansetron (ZOFRAN) injection 4 mg (has no administration in time range)  heparin injection 5,000 Units (5,000 Units Subcutaneous Given 06/24/20 0558)  insulin aspart (novoLOG) injection 0-15 Units (2 Units Subcutaneous Given 06/24/20 0816)  albuterol (PROVENTIL) (2.5 MG/3ML) 0.083% nebulizer solution 2.5 mg (has no administration in time range)  potassium chloride (KLOR-CON) CR tablet 10 mEq (has no administration in time range)  furosemide (LASIX) tablet 40 mg (40 mg Oral Given 06/24/20 1215)  carvedilol (COREG) tablet 3.125 mg (has no administration in time range)  sodium chloride flush (NS) 0.9 % injection 3 mL (3 mLs Intravenous Given 06/23/20 2109)  furosemide (LASIX) injection 80 mg (80 mg Intravenous Given 06/23/20 2109)    ED Course  I have reviewed the triage  vital signs and the nursing notes.  Pertinent labs & imaging results that were available during my care of the patient were reviewed by me and considered in my medical decision making (see chart for details).    MDM Rules/Calculators/A&P                         74 yo M w/ significant CV hx p/w cp.  AF VSS.  Exam as above.  EKG showed LBBB present on previous EKGs and nonspecific ST changes,  Trop 35-->40.  Remainder of CBC, Bmp unremarkable.  Given pt's significant cardiac hx and recent cath w/ significant 3 vessel dz cardiology was consulted for possible admission.  Cards agreed to eval the pt at bedside.  At time of signout pt's w/u is incomplete  and awaiting cards recs.  Pt signed out to Dr. Johney Maine in stable condition w/o further events.  Final Clinical Impression(s) / ED Diagnoses Final diagnoses:  Nonspecific chest pain    Rx / DC Orders ED Discharge Orders         Ordered    potassium chloride (KLOR-CON) 10 MEQ tablet  Daily     Discontinue  Reprint     06/24/20 1135    furosemide (LASIX) 40 MG tablet  Daily     Discontinue  Reprint     06/24/20 1135    Increase activity slowly     Discontinue     06/24/20 1135    Diet - low sodium heart healthy     Discontinue     06/24/20 1135    Discharge instructions     Discontinue    Comments: 1. Follow a low-salt diet - you are allowed no more than 2,000mg  of sodium per day. Watch your fluid intake. In general, you should not be taking in more than 2 liters of fluid per day (no more than 8 glasses per day). This includes sources of water in foods like soup, coffee, tea, milk, etc. 2. Weigh yourself on the same scale at same time of day and keep a log. 3. Call your doctor: (Anytime you feel any of the following symptoms)  - 3lb weight gain overnight or 5lb within a few days - Shortness of breath, with or without a dry hacking cough  - Swelling in the hands, feet or stomach  - If you have to sleep on extra pillows at night in order to breathe   IT IS IMPORTANT TO LET YOUR DOCTOR KNOW EARLY ON IF YOU ARE HAVING SYMPTOMS SO WE CAN HELP YOU!   06/24/20 1135    Call MD for:  temperature >100.4     Discontinue     06/24/20 1135    Call MD for:  persistant nausea and vomiting     Discontinue     06/24/20 1135    Call MD for:  severe uncontrolled pain     Discontinue     06/24/20 1135    Call MD for:  redness, tenderness, or signs of infection (pain, swelling, redness, odor or green/yellow discharge around incision site)     Discontinue     06/24/20 1135    Call MD for:  difficulty breathing, headache or visual disturbances     Discontinue     06/24/20 1135    Call MD for:  hives      Discontinue     06/24/20 1135    Call MD for:  persistant dizziness or light-headedness  Discontinue     06/24/20 1135    Call MD for:  extreme fatigue     Discontinue     06/24/20 1135           Silvestre Gunner, MD 06/24/20 1328    Lajean Saver, MD 06/26/20 1050

## 2020-06-23 NOTE — ED Provider Notes (Signed)
Care of patient assumed from off-going resident at 6:50 PM.  Agree with history, physical exam and plan. See their note for further details.  Briefly, 74 y.o. male that presents with chest pain.   Vitals:   06/23/20 1800 06/23/20 1830  BP: (!) 146/63 129/61  Pulse: 62 (!) 58  Resp: 20 15  Temp:    SpO2: 97% 96%    Handoff Plan: Pending cardiology recommendations.    MDM/ED Course: Admit to cardiology.   Medications  sodium chloride flush (NS) 0.9 % injection 3 mL (has no administration in time range)  insulin aspart (novoLOG) injection 0-15 Units (has no administration in time range)  furosemide (LASIX) injection 80 mg (has no administration in time range)   New Prescriptions   No medications on file    Reevaluation/Disposition:  Upon reevaluation, patients symptoms stable.   All questions answered.  Patient and/or family was understanding and in agreement with today's assessment and plan.  Clinical Impression: 1. Nonspecific chest pain     Sherolyn Buba, MD Emergency Medicine, PGY-3  Electronically signed by:  @MEMDNR @ @TDNR @ @NOWNR @  Note: Dragon medical dictation software was used in the creation of this note.     Frann Rider, MD 06/23/20 1850    Mesner, Corene Cornea, MD 06/23/20 2009

## 2020-06-23 NOTE — Plan of Care (Signed)
Patient received to room 4. Denies any c/o at present. Admission assessment completed.

## 2020-06-23 NOTE — ED Triage Notes (Signed)
Pt in w/L chest pain, ongoing x few days. States hx of Triple Bypass w/stents x 7. Dr. Reece Agar is [place] [facility]ologist. Says his brother recently died, and he has been going through a lot of stress, pain is now worse. Denies any n/v or sob

## 2020-06-23 NOTE — Plan of Care (Signed)
Plan of care initiated.

## 2020-06-24 ENCOUNTER — Encounter (HOSPITAL_COMMUNITY): Payer: Self-pay | Admitting: Internal Medicine

## 2020-06-24 LAB — CBC
HCT: 42.5 % (ref 39.0–52.0)
Hemoglobin: 14 g/dL (ref 13.0–17.0)
MCH: 29.5 pg (ref 26.0–34.0)
MCHC: 32.9 g/dL (ref 30.0–36.0)
MCV: 89.7 fL (ref 80.0–100.0)
Platelets: 201 10*3/uL (ref 150–400)
RBC: 4.74 MIL/uL (ref 4.22–5.81)
RDW: 13.3 % (ref 11.5–15.5)
WBC: 7.7 10*3/uL (ref 4.0–10.5)
nRBC: 0 % (ref 0.0–0.2)

## 2020-06-24 LAB — BASIC METABOLIC PANEL
Anion gap: 9 (ref 5–15)
BUN: 23 mg/dL (ref 8–23)
CO2: 30 mmol/L (ref 22–32)
Calcium: 9.2 mg/dL (ref 8.9–10.3)
Chloride: 98 mmol/L (ref 98–111)
Creatinine, Ser: 1.42 mg/dL — ABNORMAL HIGH (ref 0.61–1.24)
GFR calc Af Amer: 56 mL/min — ABNORMAL LOW (ref >60)
GFR calc non Af Amer: 48 mL/min — ABNORMAL LOW (ref >60)
Glucose, Bld: 141 mg/dL — ABNORMAL HIGH (ref 70–99)
Potassium: 3.3 mmol/L — ABNORMAL LOW (ref 3.5–5.1)
Sodium: 137 mmol/L (ref 135–145)

## 2020-06-24 MED ORDER — POTASSIUM CHLORIDE ER 10 MEQ PO TBCR: 10.0000 meq | EXTENDED_RELEASE_TABLET | Freq: Every day | ORAL | 3 refills | Status: DC

## 2020-06-24 MED ORDER — FUROSEMIDE 40 MG PO TABS: 40.0000 mg | ORAL_TABLET | Freq: Every day | ORAL | 2 refills | Status: AC

## 2020-06-24 MED ORDER — FUROSEMIDE 40 MG PO TABS
40.0000 mg | ORAL_TABLET | Freq: Every day | ORAL | Status: DC
  Administered 2020-06-24: 40 mg via ORAL
  Filled 2020-06-24: qty 1

## 2020-06-24 MED ORDER — POTASSIUM CHLORIDE ER 10 MEQ PO TBCR: 10.0000 meq | EXTENDED_RELEASE_TABLET | Freq: Every day | ORAL | Status: DC

## 2020-06-24 MED ORDER — CARVEDILOL 3.125 MG PO TABS: 3.1250 mg | ORAL_TABLET | Freq: Two times a day (BID) | ORAL | 3 refills | Status: DC

## 2020-06-24 MED ORDER — CARVEDILOL 3.125 MG PO TABS: 3.1250 mg | ORAL_TABLET | Freq: Two times a day (BID) | ORAL | Status: DC

## 2020-06-24 NOTE — Progress Notes (Signed)
Progress Note  Patient Name: Jesse Hansen Date of Encounter: 06/24/2020 " [facility] Cardiologist:[facility]  Subjective   Pt feels much better No CP  Breathing is OK   "Ive walked the hall without a problem  Inpatient Medications    Scheduled Meds:  aspirin EC  81 mg Oral QHS   carvedilol  6.25 mg Oral BID WC   clopidogrel  75 mg Oral Daily   heparin  5,000 Units Subcutaneous Q8H   insulin aspart  0-15 Units Subcutaneous TID WC   losartan  25 mg Oral Daily   potassium chloride  20 mEq Oral Daily   rosuvastatin  40 mg Oral Daily   Continuous Infusions:  PRN Meds: acetaminophen, albuterol, LORazepam, nitroGLYCERIN, ondansetron (ZOFRAN) IV   Vital Signs    Vitals:   06/23/20 2238 06/24/20 0022 06/24/20 0328 06/24/20 0330  BP: 136/62 123/63  (!) 95/54  Pulse: (!) 59 62  (!) 56  Resp: 20 (!) 21  18  Temp: 98.6 F (37 C) 97.8 F (36.6 C)  (!) 97.5 F (36.4 C)  TempSrc: Oral Oral  Oral  SpO2: 98% 99%  96%  Weight: 72.1 kg  71.4 kg   Height: 6' (1.829 m)       Intake/Output Summary (Last 24 hours) at 06/24/2020 0705 Last data filed at 06/24/2020 0327 Gross per 24 hour  Intake 240 ml  Output 1800 ml  Net -1560 ml   Last 3 Weights 06/24/2020 06/23/2020 06/23/2020  Weight (lbs) 157 lb 4.8 oz 158 lb 14.4 oz 166 lb  Weight (kg) 71.351 kg 72.077 kg 75.297 kg      Telemetry    SR - Personally Reviewed  ECG   No new   - Personally Reviewed  Physical Exam   GEN: No acute distress.   Neck: No JVD Cardiac: RRR, no murmurs, rubs, or gallops.  Respiratory: Clear to auscultation bilaterally. GI: Soft, nontender, non-distended  MS: No edema; No deformity. Neuro:  Nonfocal  Psych: Normal affect   Labs    High Sensitivity Troponin:   Recent Labs  Lab 06/23/20 1319 06/23/20 1615 06/23/20 2108 06/23/20 2244  TROPONINIHS 35* 40* 40* 43*      Chemistry Recent Labs  Lab 06/23/20 1319 06/24/20 0436  NA 138 137  K 3.8 3.3*  CL 99 98  CO2 29 30  GLUCOSE  144* 141*  BUN 25* 23  CREATININE 1.50* 1.42*  CALCIUM 9.8 9.2  GFRNONAA 45* 48*  GFRAA 52* 56*  ANIONGAP 10 9     Hematology Recent Labs  Lab 06/23/20 1319 06/24/20 0436  WBC 8.1 7.7  RBC 4.69 4.74  HGB 13.9 14.0  HCT 42.4 42.5  MCV 90.4 89.7  MCH 29.6 29.5  MCHC 32.8 32.9  RDW 13.3 13.3  PLT 211 201    BNP Recent Labs  Lab 06/23/20 2108  BNP 204.0*     DDimer No results for input(s): DDIMER in the last 168 hours.   Radiology    DG Chest 2 View  Result Date: 06/23/2020 CLINICAL DATA:  Chest pain EXAM: CHEST - 2 VIEW COMPARISON:  2019 FINDINGS: The heart size and mediastinal contours are within normal limits. Post post coronary stenting and CABG. Both lungs are clear. No pleural effusion or pneumothorax. The visualized skeletal structures are unremarkable. Surgical clips overlie the upper abdomen. IMPRESSION: No acute process in the chest. Electronically Signed   By: Macy Mis M.D.   On: 06/23/2020 13:36    Cardiac  Studies   No new studies   Patient Profile     74 y.o. male hx of severe diffuse CAD and Systolic CHF who presented to [facility] for chest pressure   Assessment & Plan    1  Chest pressure   Imprved with diuresis   He is very comfortable with walking    I would recomm increasing lasix to 40 mg per day   Add 10 KCL   F/U as outpt   Limit salt to 2 grams per day   Pt did not know this he said  2  CAD   I am not conviniced of active angina   COntinue meds  Follow at [facility]  Trop elevation probably reflects demand in setting of severe CAD and LV dysfunciton   Flat    3  CHF  Volume is mproved with diuresis  Lasix change as noted   For questions or updates, please contact [facility] Please consult [email or web address] for contact info under        Signed, Dorris Carnes, MD  06/24/2020, 7:05 AM

## 2020-06-24 NOTE — Discharge Summary (Signed)
Discharge Summary    Patient ID: Jesse Hansen MRN: [id number]; DOB: 24-Mar-1946  Admit date: 06/23/2020 Discharge date: 06/24/2020  Primary Care Provider: Biagio Borg, MD  Primary Cardiologist: [facility]   Discharge Diagnoses    Active Problems:   Chest pain  Diagnostic Studies/Procedures    None   History of Present Illness     Jesse Hansen is a 74 y.o. 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  Jesse Hansen has known CAD s/p multiple PCIs and subsequent CABG at [facility] in [place] in 2013 (SVG to OM1, SVG ti Diag; free LIMA to LAD). He underwent a re-look LHC in 2015 that showed occlusion of 1 graft. He was readmitted in 2017 with CHF found to have an LVEF 40% at the time  In 2019 he was admitted with influenza at which time his troponin elevated. Echocardiogram performed which showed further reduction in LV function to 20 to 25%. Given this, he underwent LHC that showed severe 3VD with 100% prox LAD; 100% prox D1; 100% midLCx (dominant vessel); 99% ostial RCA. The free LIMA to LAD was patent with collaterals to other vessels; the SVG to D1 and SVG to OM were occluded.  Distal vessels were not good targets for revascularization. Plan for continued medical treatment. He follows with the [facility] system for his care. He reports seeing his cardiologist 05/14/20 and PCP 06/12/20.  Hospital Course     Chest pain: -Presented with chest pain>worsening over the last several weeks>>some relief with NTG>>overall changed from 4 weeks ago -Chest pain improved with diuresis. Pt has ambulated throughout the hall without recurrence.   -EKG with no acute changes  -hsT, 35>40>40>43 flat trend not consistent with ACS>>likely demand ischemia  -Last LHC  12/2017 with no targets for revascularization. He is dependent on free LIMA graft to the LAD -Continue ASA, Plavix, carvedilol, losartan, Crestor  -Medication plan>>>Increase Lasix to 40mg  PO QD and add KCL 19meq QD  -Limit Na+ intake to 2grams per day   Acute on chronic combined systolic CHF: -Last echo from 12/2017 with LVEF at 20% to 25%, diffuse hypokinesis and grade 2 diastolic dysfunction -BNP, 204 -I&O, net negative 1.3L -Weight, 157lb   DM2: -Hb A1c, 6.9 -SSI for glucose control while inpatient status  -Needs close follow up with PCP/[facility] for management   HLD: -Last LDL, 46 on 01/04/2018 -Continue   CKD: -Creatinine, 1.42 today>>imprved from 1.50 yesterday   Consultants: None   The patient was seen and examined by Dr. Harrington Challenger who feels that he is stable and ready for discharge today    Did the patient have an acute coronary syndrome (MI, NSTEMI, STEMI, etc) this admission?:  No                               Did the patient have a percutaneous coronary intervention (stent / angioplasty)?:  No.   _____________  Discharge Vitals Blood pressure (!) 108/59, pulse (!) 55, temperature 98.8 F (37.1 C), temperature source Oral, resp. rate 15, height 6' (1.829 m), weight 71.4 kg, SpO2 99 %.  Filed Weights   06/23/20 1618 06/23/20 2238 06/24/20 0328  Weight: 75.3 kg 72.1 kg  71.4 kg   Labs & Radiologic Studies    CBC Recent Labs    06/23/20 1319 06/24/20 0436  WBC 8.1 7.7  HGB 13.9 14.0  HCT 42.4 42.5  MCV 90.4 89.7  PLT 211 630   Basic Metabolic Panel Recent Labs    06/23/20 1319 06/24/20 0436  NA 138 137  K 3.8 3.3*  CL 99 98  CO2 29 30  GLUCOSE 144* 141*  BUN 25* 23  CREATININE 1.50* 1.42*  CALCIUM 9.8 9.2   Liver Function Tests No results for input(s): AST, ALT, ALKPHOS, BILITOT, PROT, ALBUMIN in the last 72 hours. No results for input(s): LIPASE, AMYLASE in the last 72 hours. High Sensitivity Troponin:   Recent Labs  Lab 06/23/20 1319  06/23/20 1615 06/23/20 2108 06/23/20 2244  TROPONINIHS 35* 40* 40* 43*    BNP Invalid input(s): POCBNP D-Dimer No results for input(s): DDIMER in the last 72 hours. Hemoglobin A1C Recent Labs    06/23/20 2108  HGBA1C 6.9*   Fasting Lipid Panel No results for input(s): CHOL, HDL, LDLCALC, TRIG, CHOLHDL, LDLDIRECT in the last 72 hours. Thyroid Function Tests No results for input(s): TSH, T4TOTAL, T3FREE, THYROIDAB in the last 72 hours.  Invalid input(s): FREET3 _____________  DG Chest 2 View  Result Date: 06/23/2020 CLINICAL DATA:  Chest pain EXAM: CHEST - 2 VIEW COMPARISON:  2019 FINDINGS: The heart size and mediastinal contours are within normal limits. Post post coronary stenting and CABG. Both lungs are clear. No pleural effusion or pneumothorax. The visualized skeletal structures are unremarkable. Surgical clips overlie the upper abdomen. IMPRESSION: No acute process in the chest. Electronically Signed   By: Macy Mis M.D.   On: 06/23/2020 13:36   Disposition   Pt is being discharged home today in good condition.  Follow-up Plans & Appointments     Follow-up Information    Clinic[name].   Contact information: [place] [address] [phone]              Discharge Instructions    Call MD for:  difficulty breathing, headache or visual disturbances   Complete by: As directed    Call MD for:  extreme fatigue   Complete by: As directed    Call MD for:  hives   Complete by: As directed    Call MD for:  persistant dizziness or light-headedness   Complete by: As directed    Call MD for:  persistant nausea and vomiting   Complete by: As directed    Call MD for:  redness, tenderness, or signs of infection (pain, swelling, redness, odor or green/yellow discharge around incision site)   Complete by: As directed    Call MD for:  severe uncontrolled pain   Complete by: As directed    Call MD for:   temperature >100.4   Complete by: As directed    Diet - low sodium heart healthy   Complete by: As directed    Discharge instructions   Complete by: As directed    1. Follow a low-salt diet - you are allowed no more than 2,000mg  of sodium per day. Watch your fluid intake. In general, you should not be taking in more than 2 liters of fluid per day (no more than 8 glasses per day). This includes sources of water in foods like soup, coffee, tea, milk, etc. 2. Weigh yourself on the same scale at same time of day and keep a log. 3.  Call your doctor: (Anytime you feel any of the following symptoms)  - 3lb weight gain overnight or 5lb within a few days - Shortness of breath, with or without a dry hacking cough  - Swelling in the hands, feet or stomach  - If you have to sleep on extra pillows at night in order to breathe   IT IS IMPORTANT TO LET YOUR DOCTOR KNOW EARLY ON IF YOU ARE HAVING SYMPTOMS SO WE CAN HELP YOU!   Increase activity slowly   Complete by: As directed      Discharge Medications   Allergies as of 06/24/2020      Reactions   Codeine Nausea And Vomiting   Nicotine    Nicotine Patch--Per Nurse "heart issues"      Medication List    TAKE these medications   acetaminophen 325 MG tablet Commonly known as: TYLENOL Take 650 mg by mouth every 6 (six) hours as needed.   albuterol 108 (90 Base) MCG/ACT inhaler Commonly known as: VENTOLIN HFA Inhale into the lungs every 6 (six) hours as needed for wheezing or shortness of breath.   aspirin EC 81 MG tablet Take 1 tablet (81 mg total) by mouth daily. What changed: when to take this   carvedilol 6.25 MG tablet Commonly known as: COREG Take 6.25 mg by mouth 2 (two) times daily with a meal. What changed: Another medication with the same name was removed. Continue taking this medication, and follow the directions you see here.   cholecalciferol 1000 units tablet Commonly known as: VITAMIN D Take 1,000 Units by mouth at  bedtime.   clopidogrel 75 MG tablet Commonly known as: PLAVIX Take 75 mg by mouth daily.   co-enzyme Q-10 50 MG capsule Take 50 mg by mouth daily.   ELDERBERRY PO Take 1 capsule by mouth daily.   furosemide 40 MG tablet Commonly known as: LASIX Take 1 tablet (40 mg total) by mouth daily. What changed:   medication strength  how much to take   glipiZIDE 5 MG tablet Commonly known as: GLUCOTROL Take 5 mg by mouth 2 (two) times daily before a meal. Take 30 minutes prior to meals.   LORazepam 1 MG tablet Commonly known as: ATIVAN TAKE TWO TABLETS DAILY BY MOUTH AT BEDTIME   losartan 25 MG tablet Commonly known as: COZAAR Take 1 tablet (25 mg total) by mouth daily.   metFORMIN 500 MG tablet Commonly known as: GLUCOPHAGE Take 1 tablet (500 mg total) by mouth 2 (two) times daily with a meal.   nitroGLYCERIN 0.4 MG SL tablet Commonly known as: NITROSTAT Place 0.4 mg under the tongue every 5 (five) minutes as needed for chest pain.   potassium chloride 10 MEQ tablet Commonly known as: KLOR-CON Take 1 tablet (10 mEq total) by mouth daily. Start taking on: June 25, 2020 What changed:   medication strength  how much to take   rosuvastatin 40 MG tablet Commonly known as: CRESTOR Take 40 mg by mouth daily.   vitamin C with rose hips 500 MG tablet Take 500 mg by mouth daily.   ZINC 15 PO Take 1 Dose by mouth daily.       Outstanding Labs/Studies   None   Duration of Discharge Encounter   Greater than 30 minutes including physician time.  Signed, Kathyrn Drown, NP 06/24/2020, 11:35 AM

## 2020-06-24 NOTE — Progress Notes (Signed)
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

## 2020-09-27 ENCOUNTER — Other Ambulatory Visit: Payer: Self-pay | Admitting: Internal Medicine

## 2020-09-27 NOTE — Telephone Encounter (Signed)
Ativan done 1 mo only  Please ask pt to make rov for further refills

## 2020-10-13 ENCOUNTER — Other Ambulatory Visit: Payer: Self-pay

## 2020-10-14 ENCOUNTER — Encounter: Payer: Self-pay | Admitting: Internal Medicine

## 2020-10-14 ENCOUNTER — Ambulatory Visit (INDEPENDENT_AMBULATORY_CARE_PROVIDER_SITE_OTHER): Payer: No Typology Code available for payment source | Admitting: Internal Medicine

## 2020-10-14 VITALS — BP 118/62 | HR 58 | Temp 98.1°F | Ht 72.0 in | Wt 168.0 lb

## 2020-10-14 DIAGNOSIS — F411 Generalized anxiety disorder: Secondary | ICD-10-CM

## 2020-10-14 DIAGNOSIS — E119 Type 2 diabetes mellitus without complications: Secondary | ICD-10-CM | POA: Diagnosis not present

## 2020-10-14 DIAGNOSIS — Z720 Tobacco use: Secondary | ICD-10-CM | POA: Diagnosis not present

## 2020-10-14 DIAGNOSIS — I10 Essential (primary) hypertension: Secondary | ICD-10-CM

## 2020-10-14 MED ORDER — LORAZEPAM 1 MG PO TABS: ORAL_TABLET | ORAL | 5 refills | Status: DC

## 2020-10-14 NOTE — Patient Instructions (Signed)
Please continue all other medications as before, and refills have been done if requested - the lorazepam  Please have the pharmacy call with any other refills you may need.  Please continue your efforts at being more active, low cholesterol diet, and weight control  Please keep your appointments with your specialists as you may have planned  Please make an Appointment to return in 6 months, or sooner if needed,

## 2020-10-14 NOTE — Progress Notes (Addendum)
Subjective:    Patient ID: Jesse Hansen, male    DOB: 1946-09-01, 74 y.o.   MRN: [id number]  HPI  Here to f/u; overall doing ok,  Pt denies chest pain, increasing sob or doe, wheezing, orthopnea, PND, increased LE swelling, palpitations, dizziness or syncope.  Pt denies new neurological symptoms such as new headache, or facial or extremity weakness or numbness.  Pt denies polydipsia, polyuria, or low sugar episode.  Pt states overall good compliance with meds, mostly trying to follow appropriate diet, with wt overall stable,  but little exercise however. But can only walks 1 block then ntg prn.   Wants to hold on lab testing until next [facility] next [facility]., most recent a1c 6.9 in July 2021.  Denies worsening depressive symptoms, suicidal ideation, or panic; has ongoing anxiety, asks for refill Past Medical History:  Diagnosis Date  . Agent orange exposure   . Anxiety   . Carotid artery disease ([place])    a. s/p carotid endarterectomy remotely.  . Chronic systolic CHF (congestive heart failure) ([place])   . COLONIC POLYPS, HX OF 01/29/2011   Qualifier: Diagnosis of  By: Jenny Reichmann MD, Hunt Oris   . Coronary artery disease involving native coronary artery with angina pectoris ([place])    a. s/p Multiple PCIs. b. CABG x 3 in 2013 (SVG-OM1, SVG-Diag with freeLIMA-LAD from SVG-Diag hood) --> relook cath in ~2015 with at least 1 graft ~occluded by report.  . Diabetes mellitus with circulatory complication ([place])   . ERECTILE DYSFUNCTION 08/15/2007   Qualifier: Diagnosis of  By: Dance CMA ([place]), Kim    . Essential hypertension   . Hyperlipidemia   . Insomnia   . LBBB (left bundle branch block)   . PTSD 07/30/2010   Qualifier: Diagnosis of  By: Jenny Reichmann MD, Hunt Oris   . PVD (peripheral vascular disease) ([place])    a. s/p AOBF and LCE in the 1990s.   Past Surgical History:  Procedure Laterality Date  . ABDOMINAL AORTIC ANEURYSM REPAIR  1996?   Done at [facility] by Dr. Donnetta Hutching  . CAROTID ENDARTERECTOMY Left    Dr. Volney American at  [facility] in [place]   . CAROTID PTA/STENT INTERVENTION Left 03/24/2017   Procedure: Carotid PTA/Stent Intervention;  Surgeon: Serafina Mitchell, MD;  Location: [facility];  Service: Cardiovascular;  Laterality: Left;  . CORONARY ARTERY BYPASS GRAFT  2013   @ [place], [place] ([place] was contaminated) -- SVG-Diag with freeLIMA-LAD sewn to SVG hood, SVG-OM1  . CORONARY STENT PLACEMENT  2007  . LEFT HEART CATH AND CORS/GRAFTS ANGIOGRAPHY N/A 01/05/2018   Procedure: LEFT HEART CATH AND CORS/GRAFTS ANGIOGRAPHY;  Surgeon: Martinique, Peter M, MD;  Location: [facility];  Service: Cardiovascular;  Laterality: N/A;  . PROSTATE BIOPSY  2009    reports that he has been smoking cigarettes. He has been smoking about 0.50 packs per day. He has never used smokeless tobacco. He reports previous alcohol use. He reports that he does not use drugs. family history includes Hypertension in his father. Allergies  Allergen Reactions  . Codeine Nausea And Vomiting  . Nicotine     Nicotine Patch--Per Nurse "heart issues"   Current Outpatient Medications on File Prior to Visit  Medication Sig Dispense Refill  . albuterol (VENTOLIN HFA) 108 (90 Base) MCG/ACT inhaler Inhale into the lungs every 6 (six) hours as needed for wheezing or shortness of breath.    . Ascorbic Acid (VITAMIN  C WITH ROSE HIPS) 500 MG tablet Take 500 mg by mouth daily.    Marland Kitchen aspirin EC 81 MG tablet Take 1 tablet (81 mg total) by mouth daily. (Patient taking differently: Take 81 mg by mouth at bedtime. ) 90 tablet 11  . carvedilol (COREG) 6.25 MG tablet Take 6.25 mg by mouth 2 (two) times daily with a meal.    . cholecalciferol (VITAMIN D) 1000 units tablet Take 1,000 Units by mouth at bedtime.    . clopidogrel (PLAVIX) 75 MG tablet Take 75 mg by mouth daily.    Marland Kitchen co-enzyme Q-10 50 MG capsule Take 50 mg by mouth daily.    Marland Kitchen ELDERBERRY PO Take 1 capsule by mouth daily.    . furosemide (LASIX) 40 MG tablet Take 1  tablet (40 mg total) by mouth daily. 60 tablet 2  . glipiZIDE (GLUCOTROL) 5 MG tablet Take 5 mg by mouth 2 (two) times daily before a meal. Take 30 minutes prior to meals.    Marland Kitchen losartan (COZAAR) 25 MG tablet Take 1 tablet (25 mg total) by mouth daily. 30 tablet 4  . metFORMIN (GLUCOPHAGE) 500 MG tablet Take 1 tablet (500 mg total) by mouth 2 (two) times daily with a meal.    . nitroGLYCERIN (NITROSTAT) 0.4 MG SL tablet Place 0.4 mg under the tongue every 5 (five) minutes as needed for chest pain.    . potassium chloride (KLOR-CON) 10 MEQ tablet Take 1 tablet (10 mEq total) by mouth daily. 60 tablet 3  . rosuvastatin (CRESTOR) 40 MG tablet Take 40 mg by mouth daily.    . Zinc Sulfate (ZINC 15 PO) Take 1 Dose by mouth daily.     No current facility-administered medications on file prior to visit.   Review of Systems All otherwise neg per pt    Objective:   Physical Exam BP 118/62 (BP Location: Left Arm, Patient Position: Sitting, Cuff Size: Large)   Pulse (!) 58   Temp 98.1 F (36.7 C) (Oral)   Ht 6' (1.829 m)   Wt 168 lb (76.2 kg)   SpO2 96%   BMI 22.78 kg/m  VS noted,  Constitutional: Pt appears in NAD HENT: Head: NCAT.  Right Ear: External ear normal.  Left Ear: External ear normal.  Eyes: . Pupils are equal, round, and reactive to light. Conjunctivae and EOM are normal Nose: without d/c or deformity Neck: Neck supple. Gross normal ROM Cardiovascular: Normal rate and regular rhythm.   Pulmonary/Chest: Effort normal and breath sounds without rales or wheezing.  Abd:  Soft, NT, ND, + BS, no organomegaly Neurological: Pt is alert. At baseline orientation, motor grossly intact Skin: Skin is warm. No rashes, other new lesions, no LE edema Psychiatric: Pt behavior is normal without agitation  All otherwise neg per pt Lab Results  Component Value Date   WBC 7.7 06/24/2020   HGB 14.0 06/24/2020   HCT 42.5 06/24/2020   PLT 201 06/24/2020   GLUCOSE 141 (H) 06/24/2020   CHOL 96  01/04/2018   TRIG 133 01/04/2018   HDL 23 (L) 01/04/2018   LDLCALC 46 01/04/2018   ALT 48 01/05/2018   AST 67 (H) 01/05/2018   NA 137 06/24/2020   K 3.3 (L) 06/24/2020   CL 98 06/24/2020   CREATININE 1.42 (H) 06/24/2020   BUN 23 06/24/2020   CO2 30 06/24/2020   TSH 1.139 01/04/2018   INR 1.16 01/05/2018   HGBA1C 6.9 (H) 06/23/2020  Assessment & Plan:

## 2020-10-19 ENCOUNTER — Encounter: Payer: Self-pay | Admitting: Internal Medicine

## 2020-10-19 NOTE — Assessment & Plan Note (Signed)
stable overall by history and exam, recent data reviewed with pt, and pt to continue medical treatment as before,  to f/u any worsening symptoms or concerns  

## 2020-10-19 NOTE — Assessment & Plan Note (Signed)
Counseled to stop, not ready to quit

## 2020-10-19 NOTE — Assessment & Plan Note (Addendum)

## 2020-10-19 NOTE — Assessment & Plan Note (Signed)
[facility] for lorazepam prn refill,  to f/u any worsening symptoms or concerns

## 2021-03-23 ENCOUNTER — Other Ambulatory Visit: Payer: Self-pay | Admitting: Internal Medicine

## 2021-04-27 ENCOUNTER — Ambulatory Visit (INDEPENDENT_AMBULATORY_CARE_PROVIDER_SITE_OTHER): Payer: No Typology Code available for payment source | Admitting: Internal Medicine

## 2021-04-27 ENCOUNTER — Encounter: Payer: Self-pay | Admitting: Internal Medicine

## 2021-04-27 ENCOUNTER — Other Ambulatory Visit: Payer: Self-pay

## 2021-04-27 DIAGNOSIS — F411 Generalized anxiety disorder: Secondary | ICD-10-CM

## 2021-04-27 DIAGNOSIS — S0081XA Abrasion of other part of head, initial encounter: Secondary | ICD-10-CM | POA: Insufficient documentation

## 2021-04-27 DIAGNOSIS — F1999 Other psychoactive substance use, unspecified with unspecified psychoactive substance-induced disorder: Secondary | ICD-10-CM | POA: Insufficient documentation

## 2021-04-27 DIAGNOSIS — Z122 Encounter for screening for malignant neoplasm of respiratory organs: Secondary | ICD-10-CM | POA: Insufficient documentation

## 2021-04-27 DIAGNOSIS — R918 Other nonspecific abnormal finding of lung field: Secondary | ICD-10-CM | POA: Insufficient documentation

## 2021-04-27 DIAGNOSIS — D400 Neoplasm of uncertain behavior of prostate: Secondary | ICD-10-CM | POA: Insufficient documentation

## 2021-04-27 DIAGNOSIS — L570 Actinic keratosis: Secondary | ICD-10-CM | POA: Insufficient documentation

## 2021-04-27 DIAGNOSIS — C4490 Unspecified malignant neoplasm of skin, unspecified: Secondary | ICD-10-CM | POA: Insufficient documentation

## 2021-04-27 DIAGNOSIS — G459 Transient cerebral ischemic attack, unspecified: Secondary | ICD-10-CM | POA: Insufficient documentation

## 2021-04-27 DIAGNOSIS — Z0001 Encounter for general adult medical examination with abnormal findings: Secondary | ICD-10-CM | POA: Diagnosis not present

## 2021-04-27 DIAGNOSIS — E119 Type 2 diabetes mellitus without complications: Secondary | ICD-10-CM | POA: Diagnosis not present

## 2021-04-27 DIAGNOSIS — N1832 Chronic kidney disease, stage 3b: Secondary | ICD-10-CM

## 2021-04-27 DIAGNOSIS — I6529 Occlusion and stenosis of unspecified carotid artery: Secondary | ICD-10-CM | POA: Insufficient documentation

## 2021-04-27 DIAGNOSIS — R55 Syncope and collapse: Secondary | ICD-10-CM | POA: Insufficient documentation

## 2021-04-27 DIAGNOSIS — N480 Leukoplakia of penis: Secondary | ICD-10-CM | POA: Insufficient documentation

## 2021-04-27 DIAGNOSIS — Z9861 Coronary angioplasty status: Secondary | ICD-10-CM | POA: Insufficient documentation

## 2021-04-27 DIAGNOSIS — D126 Benign neoplasm of colon, unspecified: Secondary | ICD-10-CM | POA: Insufficient documentation

## 2021-04-27 DIAGNOSIS — Z7189 Other specified counseling: Secondary | ICD-10-CM | POA: Insufficient documentation

## 2021-04-27 DIAGNOSIS — J300 Vasomotor rhinitis: Secondary | ICD-10-CM | POA: Insufficient documentation

## 2021-04-27 MED ORDER — LORAZEPAM 1 MG PO TABS: ORAL_TABLET | ORAL | 5 refills | Status: DC

## 2021-04-27 NOTE — Assessment & Plan Note (Signed)
Pt brings jan 2022 [facility] lab with a1c 7.1, has further labs pending July 2022

## 2021-04-27 NOTE — Assessment & Plan Note (Signed)
Age and sex appropriate education and counseling updated with regular exercise and diet Referrals for preventative services - declines hep c screen or other labs Immunizations addressed - decliens pneumovax Smoking counseling  - counseled to quit, pt not ready Evidence for depression or other mood disorder - has chronic anxiety Most recent labs reviewed. I have personally reviewed and have noted: 1) the patient's medical and social history 2) The patient's current medications and supplements 3) The patient's height, weight, and BMI have been recorded in the chart

## 2021-04-27 NOTE — Progress Notes (Signed)
Patient ID: Jesse Hansen, male   DOB: 12/31/1945, 75 y.o.   MRN: [id number]         Chief Complaint:: wellness exam and Follow-up  anxiety, DM       HPI:  Jesse Hansen is a 75 y.o. male here for wellness exam; has had pneumonia shots at [place] but not sure of details, declines hep c screen, follow closely at the [facility] for CKD o/w up to date with preventive referrals and immunizations                        Also does not want lab today if possible as has f/u with renal in July 2022 at [place].  Pt denies chest pain, increased sob or doe, wheezing, orthopnea, PND, increased LE swelling, palpitations, dizziness or syncope.   Pt denies polydipsia, polyuria, or new focal neuro s/s.  Denies worsening depressive symptoms, suicidal ideation, or panic; has ongoing anxiety, stable but PC at [place] will not rx ativan that he has been on > 20 yrs and has tried mult other meds in past with side effects.   Pt denies fever, wt loss, night sweats, loss of appetite, or other constitutional symptoms  Wt Readings from Last 3 Encounters:  04/27/21 171 lb (77.6 kg)  10/14/20 168 lb (76.2 kg)  06/24/20 157 lb 4.8 oz (71.4 kg)   BP Readings from Last 3 Encounters:  04/27/21 120/72  10/14/20 118/62  06/24/20 (!) 108/59   Immunization History  Administered Date(s) Administered  . Influenza Whole 07/27/2010  . Influenza, High Dose Seasonal PF 08/28/2010, 11/04/2016, 10/12/2019  . Influenza, Seasonal, Injecte, Preservative Fre 12/15/2015  . Influenza-Unspecified 02/06/2002, 08/23/2003, 11/23/2003, 09/22/2004, 09/22/2005, 11/28/2009, 10/08/2011, 10/03/2014, 01/31/2015, 10/19/2017, 09/22/2018  . Moderna Sars-Covid-2 Vaccination 12/19/2019, 01/16/2020, 10/31/2020  . Pneumococcal Conjugate-13 09/13/2014, 04/04/2017  . Pneumococcal Polysaccharide-23 11/22/2004, 05/04/2013  . Pneumococcal-Unspecified 08/10/2002  . Td 08/10/2002, 11/23/2003  . Tdap 01/23/2012  . Zoster Recombinat (Shingrix) 04/10/2020, 06/12/2020, 08/20/2020    Health Maintenance Due  Topic Date Due  . HEMOGLOBIN A1C  12/24/2020  . COVID-19 Vaccine (4 - Booster for Moderna series) 01/29/2021      Past Medical History:  Diagnosis Date  . Agent orange exposure   . Anxiety   . Carotid artery disease ([place])    a. s/p carotid endarterectomy remotely.  . Chronic systolic CHF (congestive heart failure) ([place])   . COLONIC POLYPS, HX OF 01/29/2011   Qualifier: Diagnosis of  By: Jenny Reichmann MD, Hunt Oris   . Coronary artery disease involving native coronary artery with angina pectoris ([place])    a. s/p Multiple PCIs. b. CABG x 3 in 2013 (SVG-OM1, SVG-Diag with freeLIMA-LAD from SVG-Diag hood) --> relook cath in ~2015 with at least 1 graft ~occluded by report.  . Diabetes mellitus with circulatory complication ([place])   . ERECTILE DYSFUNCTION 08/15/2007   Qualifier: Diagnosis of  By: Dance CMA ([place]), Kim    . Essential hypertension   . Hyperlipidemia   . Insomnia   . LBBB (left bundle branch block)   . PTSD 07/30/2010   Qualifier: Diagnosis of  By: Jenny Reichmann MD, Hunt Oris   . PVD (peripheral vascular disease) ([place])    a. s/p AOBF and LCE in the 1990s.   Past Surgical History:  Procedure Laterality Date  . ABDOMINAL AORTIC ANEURYSM REPAIR  1996?   Done at [facility] by Dr. Donnetta Hutching  . CAROTID ENDARTERECTOMY Left    Dr. Volney American at [facility] in [place]   .  CAROTID PTA/STENT INTERVENTION Left 03/24/2017   Procedure: Carotid PTA/Stent Intervention;  Surgeon: Serafina Mitchell, MD;  Location: [place] [facility];  Service: Cardiovascular;  Laterality: Left;  . CORONARY ARTERY BYPASS GRAFT  2013   @ [place], [place] ([place] was contaminated) -- SVG-Diag with freeLIMA-LAD sewn to SVG hood, SVG-OM1  . CORONARY STENT PLACEMENT  2007  . LEFT HEART CATH AND CORS/GRAFTS ANGIOGRAPHY N/A 01/05/2018   Procedure: LEFT HEART CATH AND CORS/GRAFTS ANGIOGRAPHY;  Surgeon: Martinique, Peter M, MD;  Location: [place] [facility];  Service: Cardiovascular;  Laterality: N/A;  .  PROSTATE BIOPSY  2009    reports that he has been smoking cigarettes. He has been smoking about 0.50 packs per day. He has never used smokeless tobacco. He reports previous alcohol use. He reports that he does not use drugs. family history includes Hypertension in his father. Allergies  Allergen Reactions  . Codeine Nausea And Vomiting and Itching    Other reaction(s): Paresthesia, Finding of vomiting  . Nicotine     Nicotine Patch--Per Nurse "heart issues"  . Niacin Other (See Comments)   Current Outpatient Medications on File Prior to Visit  Medication Sig Dispense Refill  . albuterol (VENTOLIN HFA) 108 (90 Base) MCG/ACT inhaler Inhale into the lungs every 6 (six) hours as needed for wheezing or shortness of breath.    Marland Kitchen ammonium lactate (LAC-HYDRIN) 12 % lotion Apply topically.    . Ascorbic Acid (VITAMIN C WITH ROSE HIPS) 500 MG tablet Take 500 mg by mouth daily.    Marland Kitchen aspirin EC 81 MG tablet Take 1 tablet (81 mg total) by mouth daily. (Patient taking differently: Take 81 mg by mouth at bedtime.) 90 tablet 11  . azelastine (ASTELIN) 0.1 % nasal spray Place 2 sprays into the nose 2 (two) times daily.    . carboxymethylcellulose (REFRESH PLUS) 0.5 % SOLN Apply to eye.    . carvedilol (COREG) 6.25 MG tablet Take 6.25 mg by mouth 2 (two) times daily with a meal.    . cholecalciferol (VITAMIN D) 1000 units tablet Take 1,000 Units by mouth at bedtime.    . clopidogrel (PLAVIX) 75 MG tablet Take 75 mg by mouth daily.    Marland Kitchen co-enzyme Q-10 50 MG capsule Take 50 mg by mouth daily.    Marland Kitchen ELDERBERRY PO Take 1 capsule by mouth daily.    . furosemide (LASIX) 40 MG tablet Take 1 tablet (40 mg total) by mouth daily. 60 tablet 2  . glipiZIDE (GLUCOTROL) 5 MG tablet Take 5 mg by mouth 2 (two) times daily before a meal. Take 30 minutes prior to meals.    Marland Kitchen losartan (COZAAR) 25 MG tablet Take 1 tablet (25 mg total) by mouth daily. 30 tablet 4  . melatonin 3 MG TABS tablet Take by mouth.    . metFORMIN  (GLUCOPHAGE) 500 MG tablet Take 1 tablet (500 mg total) by mouth 2 (two) times daily with a meal.    . nitroGLYCERIN (NITROSTAT) 0.4 MG SL tablet Place 0.4 mg under the tongue every 5 (five) minutes as needed for chest pain.    . potassium chloride (KLOR-CON) 10 MEQ tablet Take 1 tablet (10 mEq total) by mouth daily. 60 tablet 3  . potassium chloride SA (KLOR-CON) 20 MEQ tablet Take by mouth.    . rosuvastatin (CRESTOR) 40 MG tablet Take 40 mg by mouth daily.    . vitamin B-12 (CYANOCOBALAMIN) 500 MCG tablet Take 1 tablet  by mouth daily.    . Zinc Sulfate (ZINC 15 PO) Take 1 Dose by mouth daily.     No current facility-administered medications on file prior to visit.        ROS:  All others reviewed and negative.  Objective        PE:  BP 120/72 (BP Location: Left Arm, Patient Position: Sitting, Cuff Size: Normal)   Pulse 64   Temp 98 F (36.7 C) (Oral)   Ht 6' (1.829 m)   Wt 171 lb (77.6 kg)   SpO2 98%   BMI 23.19 kg/m                 Constitutional: Pt appears in NAD               HENT: Head: NCAT.                Right Ear: External ear normal.                 Left Ear: External ear normal.                Eyes: . Pupils are equal, round, and reactive to light. Conjunctivae and EOM are normal               Nose: without d/c or deformity               Neck: Neck supple. Gross normal ROM               Cardiovascular: Normal rate and regular rhythm.                 Pulmonary/Chest: Effort normal and breath sounds without rales or wheezing.                Abd:  Soft, NT, ND, + BS, no organomegaly               Neurological: Pt is alert. At baseline orientation, motor grossly intact               Skin: Skin is warm. No rashes, no other new lesions, LE edema - none               Psychiatric: Pt behavior is normal without agitation   Micro: none  Cardiac tracings I have personally interpreted today:  none  Pertinent Radiological findings (summarize): none   Lab Results   Component Value Date   WBC 7.7 06/24/2020   HGB 14.0 06/24/2020   HCT 42.5 06/24/2020   PLT 201 06/24/2020   GLUCOSE 141 (H) 06/24/2020   CHOL 96 01/04/2018   TRIG 133 01/04/2018   HDL 23 (L) 01/04/2018   LDLCALC 46 01/04/2018   ALT 48 01/05/2018   AST 67 (H) 01/05/2018   NA 137 06/24/2020   K 3.3 (L) 06/24/2020   CL 98 06/24/2020   CREATININE 1.42 (H) 06/24/2020   BUN 23 06/24/2020   CO2 30 06/24/2020   TSH 1.139 01/04/2018   INR 1.16 01/05/2018   HGBA1C 6.9 (H) 06/23/2020   Assessment/Plan:  Jesse Hansen is a 75 y.o. White or Caucasian [1] male with  has a past medical history of Agent orange exposure, Anxiety, Carotid artery disease ([place]), Chronic systolic CHF (congestive heart failure) ([place]), COLONIC POLYPS, HX OF (01/29/2011), Coronary artery disease involving native coronary artery with angina pectoris ([place]), Diabetes mellitus with circulatory complication ([place]), ERECTILE DYSFUNCTION (08/15/2007), Essential hypertension, Hyperlipidemia, Insomnia, LBBB (left bundle branch  block), PTSD (07/30/2010), and PVD (peripheral vascular disease) ([place]).  Encounter for well adult exam with abnormal findings Age and sex appropriate education and counseling updated with regular exercise and diet Referrals for preventative services - declines hep c screen or other labs Immunizations addressed - decliens pneumovax Smoking counseling  - counseled to quit, pt not ready Evidence for depression or other mood disorder - has chronic anxiety Most recent labs reviewed. I have personally reviewed and have noted: 1) the patient's medical and social history 2) The patient's current medications and supplements 3) The patient's height, weight, and BMI have been recorded in the chart   Type 2 diabetes mellitus without complications ([place]) Pt brings jan 2022 [place] lab with a1c 7.1, has further labs pending July 2022  Chronic kidney disease, stage 3b ([place]) [facility]  Anxiety  state Stable, continue ativan prn,  to f/u any worsening symptoms or concerns  Followup: Return in about 1 year (around 04/27/2022).  Cathlean Cower, MD 04/27/2021 1:48 PM [facility]

## 2021-04-27 NOTE — Assessment & Plan Note (Signed)
[facility]

## 2021-04-27 NOTE — Patient Instructions (Signed)
Please continue all other medications as before, and refills have been done if requested - xanax  Please have the pharmacy call with any other refills you may need.  Please continue your efforts at being more active, low cholesterol diet, and weight control.  You are otherwise up to date with prevention measures today.  Please keep your appointments with your specialists as you may have planned  Please make an Appointment to return for your 1 year visit, or sooner if needed

## 2021-04-27 NOTE — Assessment & Plan Note (Signed)
Stable, continue ativan prn,  to f/u any worsening symptoms or concerns

## 2021-11-16 ENCOUNTER — Other Ambulatory Visit: Payer: Self-pay | Admitting: Internal Medicine

## 2021-11-19 ENCOUNTER — Other Ambulatory Visit: Payer: Self-pay | Admitting: *Deleted

## 2021-11-19 DIAGNOSIS — R55 Syncope and collapse: Secondary | ICD-10-CM

## 2021-11-19 DIAGNOSIS — I739 Peripheral vascular disease, unspecified: Secondary | ICD-10-CM

## 2021-12-07 ENCOUNTER — Ambulatory Visit (HOSPITAL_COMMUNITY)
Admission: RE | Admit: 2021-12-07 | Discharge: 2021-12-07 | Disposition: A | Discharge status: Home / Self Care | Payer: No Typology Code available for payment source | Source: Ambulatory Visit | Attending: Surgery | Admitting: Surgery

## 2021-12-07 ENCOUNTER — Ambulatory Visit (INDEPENDENT_AMBULATORY_CARE_PROVIDER_SITE_OTHER)
Admission: RE | Admit: 2021-12-07 | Discharge: 2021-12-07 | Disposition: A | Discharge status: Home / Self Care | Payer: No Typology Code available for payment source | Source: Ambulatory Visit | Attending: Surgery | Admitting: Surgery

## 2021-12-07 ENCOUNTER — Encounter: Payer: Self-pay | Admitting: Surgery

## 2021-12-07 ENCOUNTER — Other Ambulatory Visit: Payer: Self-pay

## 2021-12-07 ENCOUNTER — Ambulatory Visit (INDEPENDENT_AMBULATORY_CARE_PROVIDER_SITE_OTHER): Payer: No Typology Code available for payment source | Admitting: Surgery

## 2021-12-07 ENCOUNTER — Ambulatory Visit: Payer: No Typology Code available for payment source | Admitting: Surgery

## 2021-12-07 ENCOUNTER — Encounter (HOSPITAL_COMMUNITY): Payer: No Typology Code available for payment source

## 2021-12-07 VITALS — BP 120/67 | HR 60 | Temp 96.0°F | Resp 20 | Ht 72.0 in | Wt 168.0 lb

## 2021-12-07 DIAGNOSIS — R55 Syncope and collapse: Secondary | ICD-10-CM | POA: Diagnosis not present

## 2021-12-07 DIAGNOSIS — I6523 Occlusion and stenosis of bilateral carotid arteries: Secondary | ICD-10-CM

## 2021-12-07 DIAGNOSIS — I739 Peripheral vascular disease, unspecified: Secondary | ICD-10-CM | POA: Insufficient documentation

## 2021-12-07 NOTE — Progress Notes (Signed)
[facility]  Patient name: Jesse Hansen MRN: [id number] DOB: 12-Feb-1946 Sex: male   REASON FOR VISIT:    Follow up  HISOTRY OF PRESENT ILLNESS:    Jesse Hansen is a 76 y.o. male who has a history of a left carotid endarterectomy at the [facility] in [place].  His postoperative course was complicated by dysphagia and hoarseness which eventually resolved.  He was found to have recurrent stenosis at the endarterectomy site.  He underwent left carotid stenting on 05/09/2017.  He has a known right carotid artery occlusion.  He is also undergone aortic reconstruction by Dr. Donnetta Hutching.  A outside ultrasound showed that there was potential in-stent stenosis.  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  Most recent of which was 4 months ago.  The patient is medically managed for hypercholesterolemia.  He is on dual antiplatelet therapy.  He is a diabetic.  He is medically managed for hypertension PAST MEDICAL HISTORY:   Past Medical History:  Diagnosis Date   Agent orange exposure    Anxiety    Carotid artery disease ([place])    a. s/p carotid endarterectomy remotely.   Chronic systolic CHF (congestive heart failure) ([place])    COLONIC POLYPS, HX OF 01/29/2011   Qualifier: Diagnosis of  By: Jenny Reichmann MD, Hunt Oris    Coronary artery disease involving native coronary artery with angina pectoris [facility])    a. s/p Multiple PCIs. b. CABG x 3 in 2013 (SVG-OM1, SVG-Diag with freeLIMA-LAD from SVG-Diag hood) --> relook cath in ~2015 with at least 1 graft ~occluded by report.   Diabetes mellitus with circulatory complication [facility])    ERECTILE DYSFUNCTION 08/15/2007   Qualifier: Diagnosis of  By: Dance CMA (AAMA), Kim     Essential hypertension    Hyperlipidemia    Insomnia    LBBB (left bundle branch block)    PTSD 07/30/2010   Qualifier: Diagnosis of  By: Jenny Reichmann MD, Hunt Oris    PVD  (peripheral vascular disease) ([place])    a. s/p AOBF and LCE in the 1990s.     FAMILY HISTORY:   Family History  Problem Relation Age of Onset   Hypertension Father     SOCIAL HISTORY:   Social History   Tobacco Use   Smoking status: Every Day    Packs/day: 0.50    Types: Cigarettes   Smokeless tobacco: Never   Tobacco comments:    4-8 cigarettes per day  Substance Use Topics   Alcohol use: Not Currently    Alcohol/week: 0.0 standard drinks     ALLERGIES:   Allergies  Allergen Reactions   Codeine Nausea And Vomiting and Itching    Other reaction(s): Paresthesia, Finding of vomiting   Nicotine     Nicotine Patch--Per Nurse "heart issues"   Niacin Other (See Comments)     CURRENT MEDICATIONS:   Current Outpatient Medications  Medication Sig Dispense Refill   albuterol (VENTOLIN HFA) 108 (90 Base) MCG/ACT inhaler Inhale into the lungs every 6 (six) hours as needed for wheezing or shortness of breath.     ammonium lactate (LAC-HYDRIN) 12 % lotion Apply topically.     Ascorbic Acid (VITAMIN C WITH ROSE HIPS) 500 MG tablet Take 500 mg by mouth daily.     aspirin EC 81 MG tablet Take 1 tablet (81 mg total) by mouth daily. (Patient taking differently: Take 81 mg by mouth  at bedtime.) 90 tablet 11   azelastine (ASTELIN) 0.1 % nasal spray Place 2 sprays into the nose 2 (two) times daily.     carboxymethylcellulose (REFRESH PLUS) 0.5 % SOLN Apply to eye.     carvedilol (COREG) 6.25 MG tablet Take 6.25 mg by mouth 2 (two) times daily with a meal.     cholecalciferol (VITAMIN D) 1000 units tablet Take 1,000 Units by mouth at bedtime.     clopidogrel (PLAVIX) 75 MG tablet Take 75 mg by mouth daily.     co-enzyme Q-10 50 MG capsule Take 50 mg by mouth daily.     ELDERBERRY PO Take 1 capsule by mouth daily.     furosemide (LASIX) 40 MG tablet Take 1 tablet (40 mg total) by mouth daily. 60 tablet 2   glipiZIDE (GLUCOTROL) 5 MG tablet Take 5 mg by mouth 2 (two) times daily before  a meal. Take 30 minutes prior to meals.     LORazepam (ATIVAN) 1 MG tablet TAKE ONE (1) TABLET BY MOUTH TWO (2) TIMES DAILY AS NEEDED 60 tablet 2   losartan (COZAAR) 25 MG tablet Take 1 tablet (25 mg total) by mouth daily. 30 tablet 4   metFORMIN (GLUCOPHAGE) 500 MG tablet Take 1 tablet (500 mg total) by mouth 2 (two) times daily with a meal.     nitroGLYCERIN (NITROSTAT) 0.4 MG SL tablet Place 0.4 mg under the tongue every 5 (five) minutes as needed for chest pain.     rosuvastatin (CRESTOR) 40 MG tablet Take 40 mg by mouth daily.     vitamin B-12 (CYANOCOBALAMIN) 500 MCG tablet Take 1 tablet by mouth daily.     Zinc Sulfate (ZINC 15 PO) Take 1 Dose by mouth daily.     melatonin 3 MG TABS tablet Take by mouth. (Patient not taking: Reported on 12/07/2021)     potassium chloride (KLOR-CON) 10 MEQ tablet Take 1 tablet (10 mEq total) by mouth daily. (Patient not taking: Reported on 12/07/2021) 60 tablet 3   potassium chloride SA (KLOR-CON) 20 MEQ tablet Take by mouth. (Patient not taking: Reported on 12/07/2021)     No current facility-administered medications for this visit.    REVIEW OF SYSTEMS:   [X]  denotes positive finding, [ ]  denotes negative finding Cardiac  Comments:  Chest pain or chest pressure:    Shortness of breath upon exertion:    Short of breath when lying flat:    Irregular heart rhythm:        Vascular    Pain in calf, thigh, or hip brought on by ambulation:    Pain in feet at night that wakes you up from your sleep:     Blood clot in your veins:    Leg swelling:         Pulmonary    Oxygen at home:    Productive cough:     Wheezing:         Neurologic    Sudden weakness in arms or legs:     Sudden numbness in arms or legs:     Sudden onset of difficulty speaking or slurred speech:    Temporary loss of vision in one eye:     Problems with dizziness:         Gastrointestinal    Blood in stool:     Vomited blood:         Genitourinary    Burning when  urinating:     Blood in urine:  Psychiatric    Major depression:         Hematologic    Bleeding problems:    Problems with blood clotting too easily:        Skin    Rashes or ulcers:        Constitutional    Fever or chills:      PHYSICAL EXAM:   Vitals:   12/07/21 1150  BP: 120/67  Pulse: 60  Resp: 20  Temp: (!) 96 F (35.6 C)  SpO2: 96%  Weight: 168 lb (76.2 kg)  Height: 6' (1.829 m)    GENERAL: The patient is a well-nourished male, in no acute distress. The vital signs are documented above. CARDIAC: There is a regular rate and rhythm.  VASCULAR: Palpable right radial pulse, nonpalpable left PULMONARY: Non-labored respirations MUSCULOSKELETAL: There are no major deformities or cyanosis. NEUROLOGIC: No focal weakness or paresthesias are detected. SKIN: There are no ulcers or rashes noted. PSYCHIATRIC: The patient has a normal affect.  STUDIES:   I have reviewed the following studies: Right Carotid: Evidence consistent with a total occlusion of the right  ICA.   Left Carotid: Velocities in the left ICA are consistent with a 1-39%  stenosis.                Patent stent.   Vertebrals:  Right vertebral artery demonstrates antegrade flow. Left  vertebral               artery demonstrates retrograde flow.  Subclavians: Normal flow hemodynamics were seen in the right subclavian  artery.               Monophasic left.   +-------+-----------+-----------+------------+------------+   ABI/TBI Today's ABI Today's TBI Previous ABI Previous TBI   +-------+-----------+-----------+------------+------------+   Right   0.80        0.45                                    +-------+-----------+-----------+------------+------------+   Left    0.82        0.65                                    +-------+-----------+-----------+------------+------------+    MEDICAL ISSUES:   Carotid: The patient has a known right carotid occlusion.  He has undergone left carotid  endarterectomy and subsequent left carotid stenting.  Ultrasound at the [facility] suggested in-stent stenosis however that was not visualized on our imaging study today.  In addition, the patient has had 3 episodes of passing out.  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  I discussed with the patient I think I need to get a CT angiogram to determine whether or not there is a stenosis within his left carotid stent and also for surgical planning and [place] consider a left carotid subclavian bypass for his subclavian steal.  He will get a CT scan and return in 1 to 2 weeks.    Leia Alf, MD, FACS [facility] [phone] Pager [phone]

## 2021-12-25 ENCOUNTER — Inpatient Hospital Stay: Admission: RE | Admit: 2021-12-25 | Payer: No Typology Code available for payment source | Source: Ambulatory Visit

## 2021-12-28 ENCOUNTER — Ambulatory Visit: Payer: No Typology Code available for payment source | Admitting: Surgery

## 2021-12-28 ENCOUNTER — Other Ambulatory Visit: Payer: Self-pay

## 2022-01-11 ENCOUNTER — Ambulatory Visit: Payer: No Typology Code available for payment source | Admitting: Surgery

## 2022-01-14 ENCOUNTER — Ambulatory Visit
Admission: RE | Admit: 2022-01-14 | Discharge: 2022-01-14 | Disposition: A | Discharge status: Home / Self Care | Payer: No Typology Code available for payment source | Source: Ambulatory Visit | Attending: Surgery | Admitting: Surgery

## 2022-01-14 DIAGNOSIS — I6523 Occlusion and stenosis of bilateral carotid arteries: Secondary | ICD-10-CM

## 2022-01-14 MED ORDER — IOPAMIDOL (ISOVUE-370) INJECTION 76%
60.0000 mL | Freq: Once | INTRAVENOUS | Status: AC | PRN
  Administered 2022-01-14: 60 mL via INTRAVENOUS

## 2022-01-18 ENCOUNTER — Other Ambulatory Visit: Payer: Self-pay

## 2022-01-18 ENCOUNTER — Encounter: Payer: Self-pay | Admitting: Surgery

## 2022-01-18 ENCOUNTER — Ambulatory Visit: Payer: No Typology Code available for payment source | Admitting: Surgery

## 2022-01-18 ENCOUNTER — Ambulatory Visit (INDEPENDENT_AMBULATORY_CARE_PROVIDER_SITE_OTHER): Payer: No Typology Code available for payment source | Admitting: Surgery

## 2022-01-18 VITALS — BP 132/71 | HR 66 | Temp 98.0°F | Resp 20 | Ht 72.0 in | Wt 162.0 lb

## 2022-01-18 DIAGNOSIS — I708 Atherosclerosis of other arteries: Secondary | ICD-10-CM

## 2022-01-18 DIAGNOSIS — G450 Vertebro-basilar artery syndrome: Secondary | ICD-10-CM

## 2022-01-18 NOTE — Progress Notes (Signed)
Vascular and Vein Specialist of [facility]  Patient name: Jesse Hansen MRN: [id number] DOB: February 28, 1946 Sex: male   REASON FOR VISIT:    Follow up  HISOTRY OF PRESENT ILLNESS:    Jesse Hansen is a 76 y.o. male who has a history of a left carotid endarterectomy at the [facility] in [place].  His postoperative course was complicated by dysphagia and hoarseness which eventually resolved.  He was found to have recurrent stenosis at the endarterectomy site.  He underwent left carotid stenting on 05/09/2017.  He has a known right carotid artery occlusion.  He is also undergone aortic reconstruction by Dr. Donnetta Hutching.  A outside ultrasound showed that there was potential in-stent stenosis.  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  Each time, he has been using his arms over his head.   The patient is medically managed for hypercholesterolemia.  He is on dual antiplatelet therapy.  He is a diabetic.  He is medically managed for hypertension  I sent him for CT angiogram to further evaluate the stenosis within his left carotid stent and for surgical planning.  PAST MEDICAL HISTORY:   Past Medical History:  Diagnosis Date   Agent orange exposure    Anxiety    Carotid artery disease ([place])    a. s/p carotid endarterectomy remotely.   Chronic systolic CHF (congestive heart failure) ([place])    COLONIC POLYPS, HX OF 01/29/2011   Qualifier: Diagnosis of  By: Jenny Reichmann MD, Hunt Oris    Coronary artery disease involving native coronary artery with angina pectoris [facility])    a. s/p Multiple PCIs. b. CABG x 3 in 2013 (SVG-OM1, SVG-Diag with freeLIMA-LAD from SVG-Diag hood) --> relook cath in ~2015 with at least 1 graft ~occluded by report.   Diabetes mellitus with circulatory complication [facility])    ERECTILE DYSFUNCTION 08/15/2007   Qualifier: Diagnosis of  By: Dance CMA (AAMA), Kim     Essential hypertension     Hyperlipidemia    Insomnia    LBBB (left bundle branch block)    PTSD 07/30/2010   Qualifier: Diagnosis of  By: Jenny Reichmann MD, Hunt Oris    PVD (peripheral vascular disease) ([place])    a. s/p AOBF and LCE in the 1990s.     FAMILY HISTORY:   Family History  Problem Relation Age of Onset   Hypertension Father     SOCIAL HISTORY:   Social History   Tobacco Use   Smoking status: Every Day    Packs/day: 0.50    Types: Cigarettes   Smokeless tobacco: Never   Tobacco comments:    4-8 cigarettes per day  Substance Use Topics   Alcohol use: Not Currently    Alcohol/week: 0.0 standard drinks     ALLERGIES:   Allergies  Allergen Reactions   Codeine Nausea And Vomiting and Itching    Other reaction(s): Paresthesia, Finding of vomiting   Nicotine     Nicotine Patch--Per Nurse "heart issues"   Niacin Other (See Comments)     CURRENT MEDICATIONS:   Current Outpatient Medications  Medication Sig Dispense Refill   albuterol (VENTOLIN HFA) 108 (90 Base) MCG/ACT inhaler Inhale into the lungs every 6 (six) hours as needed for wheezing or shortness of breath.     ammonium lactate (LAC-HYDRIN) 12 % lotion Apply topically.     Ascorbic Acid (VITAMIN C WITH ROSE HIPS) 500 MG tablet Take 500 mg by mouth daily.  aspirin EC 81 MG tablet Take 1 tablet (81 mg total) by mouth daily. (Patient taking differently: Take 81 mg by mouth at bedtime.) 90 tablet 11   azelastine (ASTELIN) 0.1 % nasal spray Place 2 sprays into the nose 2 (two) times daily.     carboxymethylcellulose (REFRESH PLUS) 0.5 % SOLN Apply to eye.     carvedilol (COREG) 6.25 MG tablet Take 6.25 mg by mouth 2 (two) times daily with a meal.     cholecalciferol (VITAMIN D) 1000 units tablet Take 1,000 Units by mouth at bedtime.     clopidogrel (PLAVIX) 75 MG tablet Take 75 mg by mouth daily.     co-enzyme Q-10 50 MG capsule Take 50 mg by mouth daily.     ELDERBERRY PO Take 1 capsule by mouth daily.     furosemide (LASIX) 40 MG tablet  Take 1 tablet (40 mg total) by mouth daily. 60 tablet 2   glipiZIDE (GLUCOTROL) 5 MG tablet Take 5 mg by mouth 2 (two) times daily before a meal. Take 30 minutes prior to meals.     LORazepam (ATIVAN) 1 MG tablet TAKE ONE (1) TABLET BY MOUTH TWO (2) TIMES DAILY AS NEEDED 60 tablet 2   losartan (COZAAR) 25 MG tablet Take 1 tablet (25 mg total) by mouth daily. 30 tablet 4   melatonin 3 MG TABS tablet Take by mouth. (Patient not taking: Reported on 12/07/2021)     metFORMIN (GLUCOPHAGE) 500 MG tablet Take 1 tablet (500 mg total) by mouth 2 (two) times daily with a meal.     nitroGLYCERIN (NITROSTAT) 0.4 MG SL tablet Place 0.4 mg under the tongue every 5 (five) minutes as needed for chest pain.     potassium chloride (KLOR-CON) 10 MEQ tablet Take 1 tablet (10 mEq total) by mouth daily. (Patient not taking: Reported on 12/07/2021) 60 tablet 3   potassium chloride SA (KLOR-CON) 20 MEQ tablet Take by mouth. (Patient not taking: Reported on 12/07/2021)     rosuvastatin (CRESTOR) 40 MG tablet Take 40 mg by mouth daily.     vitamin B-12 (CYANOCOBALAMIN) 500 MCG tablet Take 1 tablet by mouth daily.     Zinc Sulfate (ZINC 15 PO) Take 1 Dose by mouth daily.     No current facility-administered medications for this visit.    REVIEW OF SYSTEMS:   [X]  denotes positive finding, [ ]  denotes negative finding Cardiac  Comments:  Chest pain or chest pressure:    Shortness of breath upon exertion:    Short of breath when lying flat:    Irregular heart rhythm:        Vascular    Pain in calf, thigh, or hip brought on by ambulation:    Pain in feet at night that wakes you up from your sleep:     Blood clot in your veins:    Leg swelling:         Pulmonary    Oxygen at home:    Productive cough:     Wheezing:         Neurologic    Sudden weakness in arms or legs:     Sudden numbness in arms or legs:     Sudden onset of difficulty speaking or slurred speech:    Temporary loss of vision in one eye:      Problems with dizziness:         Gastrointestinal    Blood in stool:     Vomited blood:  Genitourinary    Burning when urinating:     Blood in urine:        Psychiatric    Major depression:         Hematologic    Bleeding problems:    Problems with blood clotting too easily:        Skin    Rashes or ulcers:        Constitutional    Fever or chills:      PHYSICAL EXAM:   There were no vitals filed for this visit.  GENERAL: The patient is a well-nourished male, in no acute distress. The vital signs are documented above. CARDIAC: There is a regular rate and rhythm.  VASCULAR: Nonpalpable left radial pulse PULMONARY: Non-labored respirations MUSCULOSKELETAL: There are no major deformities or cyanosis. NEUROLOGIC: No focal weakness or paresthesias are detected. SKIN: There are no ulcers or rashes noted. PSYCHIATRIC: The patient has a normal affect.  STUDIES:   I have reviewed his CT scan with the following findings: 1. Status post stenting of the left carotid system traversing the distal common carotid artery through the proximal internal carotid artery. The stent is patent, but due to the dilation of the internal carotid artery distal to the stent, there is not apposition of the distal stent with the carotid artery walls, and there is contrast seen outside the distal end of the stent, consistent with a small endoleak. The internal carotid artery distal to the stent is patent., in the degree of dilation of the internal carotid artery is similar to the prior study. 2. The stent covers the carotid bifurcation, but the external carotid artery is patent. 3. Unchanged occlusion of the right internal carotid artery from the bifurcation through the intracranial segment with reconstitution of flow in the MCA and ACA likely via collateral flow. 4. Occlusion of the left subclavian artery from just after the origin to just after the takeoff of the left vertebral  artery. The occlusion is unchanged, but focal moderate to severe stenosis of the subclavian artery distal to the occlusion has worsened. 5. Severe stenosis or occlusion of the proximal left vertebral artery with reconstitution of flow in the V2 segment. The proximal stenosis/occlusion has worsened since 2017. 6. Patent right vertebral artery. 7. Aortic Atherosclerosis (ICD10-I70.0) and Emphysema (ICD10-J43.9).      MEDICAL ISSUES:   Carotid stenosis: CT scan does not show any significant in-stent stenosis.  He will need repeat carotid studies in 1 year.  Vertebrobasilar insufficiency: I am concerned that his falls are related to his subclavian occlusion which is affecting blood flow through the vertebrobasilar system, causing him to have symptoms.  I propose proceeding with a left carotid subclavian bypass graft.  I will need to reevaluate the vertebral artery in the operating room to see the Gause option for revascularization.  Hopefully can just be plugged into the graft.  He wants to discuss the timing of this with his wife who is scheduled to return home in about 2 days.  Once he is scheduled, he will need to be off of his Plavix.  I discussed the details of the operation as well as risks and benefits.  All questions were answered.    Leia Alf, MD, FACS Vascular and Vein Specialists of [facility] [phone] Pager [phone]

## 2022-01-22 ENCOUNTER — Other Ambulatory Visit: Payer: Self-pay

## 2022-01-22 DIAGNOSIS — I708 Atherosclerosis of other arteries: Secondary | ICD-10-CM

## 2022-01-22 DIAGNOSIS — G450 Vertebro-basilar artery syndrome: Secondary | ICD-10-CM

## 2022-02-02 NOTE — Progress Notes (Signed)
Surgical Instructions ? ? ? Your procedure is scheduled on Friday, March 17th, 2023. ? ? Report to [facility] Main Entrance "A" at 05:30 A.M., then check in with the Admitting office. ? Call this number if you have problems the morning of surgery: ? [phone] ? ? If you have any questions prior to your surgery date call [phone]: Open Monday-Friday 8am-4pm ? ? ? Remember: ? Do not eat or drink after midnight the night before your surgery ?  ? Take these medicines the morning of surgery with A SIP OF WATER:  ? ?carvedilol (COREG) ?rosuvastatin (CRESTOR) ?isosorbide dinitrate (ISORDIL) ? ?If Needed: ? ?acetaminophen (TYLENOL)  ?albuterol (VENTOLIN HFA)  ?azelastine (ASTELIN) ?carboxymethylcellulose (REFRESH PLUS) ?ipratropium (ATROVENT)  ?nitroGLYCERIN (NITROSTAT) ? ?Follow your surgeon's instructions on when to stop Aspirin and Plavix.  If no instructions were given by your surgeon then you will need to call the office to get those instructions.    ? ?As of today, STOP taking any Aspirin (unless otherwise instructed by your surgeon) Aleve, Naproxen, Ibuprofen, Motrin, Advil, Goody's, BC's, all herbal medications, fish oil, and all vitamins. ? ? ?WHAT DO I DO ABOUT MY DIABETES MEDICATION? ? ? ?Do not take empagliflozin (JARDIANCE) the day before surgery and the day of surgery. ? ?Do not take glipiZIDE (GLUCOTROL) the night before surgery and the morning of surgery. ? ?Do not take metFORMIN (GLUCOPHAGE) the morning of surgery. ? ? ?HOW TO MANAGE YOUR DIABETES ?BEFORE AND AFTER SURGERY ? ?Why is it important to control my blood sugar before and after surgery? ?Improving blood sugar levels before and after surgery helps healing and can limit problems. ?A way of improving blood sugar control is eating a healthy diet by: ? Eating less sugar and carbohydrates ? Increasing activity/exercise ? Talking with your doctor about reaching your blood sugar goals ?High blood sugars (greater than 180 mg/dL) can raise your risk  of infections and slow your recovery, so you will need to focus on controlling your diabetes during the weeks before surgery. ?Make sure that the doctor who takes care of your diabetes knows about your planned surgery including the date and location. ? ?How do I manage my blood sugar before surgery? ?Check your blood sugar at least 4 times a day, starting 2 days before surgery, to make sure that the level is not too high or low. ? ?Check your blood sugar the morning of your surgery when you wake up and every 2 hours until you get to the Short Stay unit. ? ?If your blood sugar is less than 70 mg/dL, you will need to treat for low blood sugar: ?Do not take insulin. ?Treat a low blood sugar (less than 70 mg/dL) with ? cup of clear juice (cranberry or apple), 4 glucose tablets, OR glucose gel. ?Recheck blood sugar in 15 minutes after treatment (to make sure it is greater than 70 mg/dL). If your blood sugar is not greater than 70 mg/dL on recheck, call [phone] for further instructions. ?Report your blood sugar to the short stay nurse when you get to Short Stay. ? ?If you are admitted to the hospital after surgery: ?Your blood sugar will be checked by the staff and you will probably be given insulin after surgery (instead of oral diabetes medicines) to make sure you have good blood sugar levels. ?The goal for blood sugar control after surgery is 80-180 mg/dL.  ? ? ? The day of surgery: ?         ?Do not wear  jewelry  ?Do not wear lotions, powders, colognes, or deodorant. ?Men may shave face and neck. ?Do not bring valuables to the hospital. ? ? ?[facility] is not responsible for any belongings or valuables. .  ? ?Do NOT Smoke (Tobacco/Vaping)  24 hours prior to your procedure ? ?If you use a CPAP at night, you may bring your mask for your overnight stay. ?  ?Contacts, glasses, hearing aids, dentures or partials may not be worn into surgery, please bring cases for these belongings ?  ?For patients admitted to the  hospital, discharge time will be determined by your treatment team. ?  ?Patients discharged the day of surgery will not be allowed to drive home, and someone needs to stay with them for 24 hours. ? ?NO VISITORS WILL BE ALLOWED IN PRE-OP WHERE PATIENTS ARE PREPPED FOR SURGERY.  ONLY 1 SUPPORT PERSON MAY BE PRESENT IN THE WAITING ROOM WHILE YOU ARE IN SURGERY.  IF YOU ARE TO BE ADMITTED, ONCE YOU ARE IN YOUR ROOM YOU WILL BE ALLOWED TWO (2) VISITORS. 1 (ONE) VISITOR MAY STAY OVERNIGHT BUT MUST ARRIVE TO THE ROOM BY 8pm.  Minor children may have two parents present. Special consideration for safety and communication needs will be reviewed on a case by case basis. ? ?Special instructions:   ? ?Oral Hygiene is also important to reduce your risk of infection.  Remember - BRUSH YOUR TEETH THE MORNING OF SURGERY WITH YOUR REGULAR TOOTHPASTE ? ? ?[facility]- Preparing For Surgery ? ?Before surgery, you can play an important role. Because skin is not sterile, your skin needs to be as free of germs as possible. You can reduce the number of germs on your skin by washing with CHG (chlorahexidine gluconate) Soap before surgery.  CHG is an antiseptic cleaner which kills germs and bonds with the skin to continue killing germs even after washing.   ? ? ?Please do not use if you have an allergy to CHG or antibacterial soaps. If your skin becomes reddened/irritated stop using the CHG.  ?Do not shave (including legs and underarms) for at least 48 hours prior to first CHG shower. It is OK to shave your face. ? ?Please follow these instructions carefully. ?  ? ? Shower the NIGHT BEFORE SURGERY and the MORNING OF SURGERY with CHG Soap.  ? If you chose to wash your hair, wash your hair first as usual with your normal shampoo. After you shampoo, rinse your hair and body thoroughly to remove the shampoo.  Then [employer] and genitals (private parts) with your normal soap and rinse thoroughly to remove soap. ? ?After that Use CHG Soap as you  would any other liquid soap. You can apply CHG directly to the skin and wash gently with a scrungie or a clean washcloth.  ? ?Apply the CHG Soap to your body ONLY FROM THE NECK DOWN.  Do not use on open wounds or open sores. Avoid contact with your eyes, ears, mouth and genitals (private parts). Wash Face and genitals (private parts)  with your normal soap.  ? ?Wash thoroughly, paying special attention to the area where your surgery will be performed. ? ?Thoroughly rinse your body with warm water from the neck down. ? ?DO NOT shower/wash with your normal soap after using and rinsing off the CHG Soap. ? ?Pat yourself dry with a CLEAN TOWEL. ? ?Wear CLEAN PAJAMAS to bed the night before surgery ? ?Place CLEAN SHEETS on your bed the night before your surgery ? ?  DO NOT SLEEP WITH PETS. ? ? ?Day of Surgery: ? ?Take a shower with CHG soap. ?Wear Clean/Comfortable clothing the morning of surgery ?Do not apply any deodorants/lotions.   ?Remember to brush your teeth WITH YOUR REGULAR TOOTHPASTE. ? ? ? ?COVID testing ? ?If you are going to stay overnight or be admitted after your procedure/surgery and require a pre-op COVID test, please follow these instructions after your COVID test  ? ?You are not required to quarantine however you are required to wear a well-fitting mask when you are out and around people not in your household.  If your mask becomes wet or soiled, replace with a new one. ? ?Wash your hands often with soap and water for 20 seconds or clean your hands with an alcohol-based hand sanitizer that contains at least 60% alcohol. ? ?Do not share personal items. ? ?Notify your provider: ?if you are in close contact with someone who has COVID  ?or if you develop a fever of 100.4 or greater, sneezing, cough, sore throat, shortness of breath or body aches. ? ?  ?Please read over the following fact sheets that you were given.   ?

## 2022-02-03 ENCOUNTER — Encounter (HOSPITAL_COMMUNITY)
Admission: RE | Admit: 2022-02-03 | Discharge: 2022-02-03 | Disposition: A | Discharge status: Home / Self Care | Payer: No Typology Code available for payment source | Source: Ambulatory Visit | Attending: Surgery | Admitting: Surgery

## 2022-02-03 ENCOUNTER — Encounter (HOSPITAL_COMMUNITY): Payer: Self-pay

## 2022-02-03 ENCOUNTER — Other Ambulatory Visit: Payer: Self-pay

## 2022-02-03 VITALS — BP 109/61 | HR 61 | Temp 97.7°F | Resp 18 | Ht 72.0 in | Wt 165.2 lb

## 2022-02-03 DIAGNOSIS — G450 Vertebro-basilar artery syndrome: Secondary | ICD-10-CM | POA: Insufficient documentation

## 2022-02-03 DIAGNOSIS — Z01812 Encounter for preprocedural laboratory examination: Secondary | ICD-10-CM | POA: Insufficient documentation

## 2022-02-03 DIAGNOSIS — E119 Type 2 diabetes mellitus without complications: Secondary | ICD-10-CM

## 2022-02-03 DIAGNOSIS — Z01818 Encounter for other preprocedural examination: Secondary | ICD-10-CM

## 2022-02-03 DIAGNOSIS — I447 Left bundle-branch block, unspecified: Secondary | ICD-10-CM | POA: Diagnosis not present

## 2022-02-03 DIAGNOSIS — Z951 Presence of aortocoronary bypass graft: Secondary | ICD-10-CM | POA: Diagnosis not present

## 2022-02-03 DIAGNOSIS — E1151 Type 2 diabetes mellitus with diabetic peripheral angiopathy without gangrene: Secondary | ICD-10-CM | POA: Diagnosis not present

## 2022-02-03 DIAGNOSIS — I708 Atherosclerosis of other arteries: Secondary | ICD-10-CM | POA: Diagnosis not present

## 2022-02-03 DIAGNOSIS — I5022 Chronic systolic (congestive) heart failure: Secondary | ICD-10-CM | POA: Diagnosis not present

## 2022-02-03 DIAGNOSIS — I110 Hypertensive heart disease with heart failure: Secondary | ICD-10-CM | POA: Insufficient documentation

## 2022-02-03 HISTORY — DX: Pneumonia, unspecified organism: J18.9

## 2022-02-03 LAB — TYPE AND SCREEN
ABO/RH(D): A POS
Antibody Screen: NEGATIVE

## 2022-02-03 LAB — COMPREHENSIVE METABOLIC PANEL
ALT: 21 U/L (ref 0–44)
AST: 21 U/L (ref 15–41)
Albumin: 3.9 g/dL (ref 3.5–5.0)
Alkaline Phosphatase: 50 U/L (ref 38–126)
Anion gap: 9 (ref 5–15)
BUN: 21 mg/dL (ref 8–23)
CO2: 26 mmol/L (ref 22–32)
Calcium: 9.3 mg/dL (ref 8.9–10.3)
Chloride: 103 mmol/L (ref 98–111)
Creatinine, Ser: 1.62 mg/dL — ABNORMAL HIGH (ref 0.61–1.24)
GFR, Estimated: 43 mL/min — ABNORMAL LOW (ref >60)
Glucose, Bld: 192 mg/dL — ABNORMAL HIGH (ref 70–99)
Potassium: 3.9 mmol/L (ref 3.5–5.1)
Sodium: 138 mmol/L (ref 135–145)
Total Bilirubin: 0.9 mg/dL (ref 0.3–1.2)
Total Protein: 6.9 g/dL (ref 6.5–8.1)

## 2022-02-03 LAB — URINALYSIS, ROUTINE W REFLEX MICROSCOPIC
Bilirubin Urine: NEGATIVE
Glucose, UA: >=500 mg/dL — AB
Ketones, ur: NEGATIVE mg/dL
Leukocytes,Ua: NEGATIVE
Nitrite: NEGATIVE
Protein, ur: NEGATIVE mg/dL
Specific Gravity, Urine: 1.011 (ref 1.005–1.030)
pH: 5 (ref 5.0–8.0)

## 2022-02-03 LAB — CBC
HCT: 40.6 % (ref 39.0–52.0)
Hemoglobin: 14 g/dL (ref 13.0–17.0)
MCH: 31.7 pg (ref 26.0–34.0)
MCHC: 34.5 g/dL (ref 30.0–36.0)
MCV: 91.9 fL (ref 80.0–100.0)
Platelets: 186 10*3/uL (ref 150–400)
RBC: 4.42 MIL/uL (ref 4.22–5.81)
RDW: 13.7 % (ref 11.5–15.5)
WBC: 8 10*3/uL (ref 4.0–10.5)
nRBC: 0 % (ref 0.0–0.2)

## 2022-02-03 LAB — SURGICAL PCR SCREEN
MRSA, PCR: NEGATIVE
Staphylococcus aureus: NEGATIVE

## 2022-02-03 LAB — PROTIME-INR
INR: 1.1 (ref 0.8–1.2)
Prothrombin Time: 14 seconds (ref 11.4–15.2)

## 2022-02-03 LAB — APTT: aPTT: 35 seconds (ref 24–36)

## 2022-02-03 LAB — GLUCOSE, CAPILLARY: Glucose-Capillary: 192 mg/dL — ABNORMAL HIGH (ref 70–99)

## 2022-02-03 NOTE — Progress Notes (Signed)
[facility] - [name]-Dr. Elon Alas ?Cardiologist - [name]-pt could not provide name.  ? ?PPM/ICD - denies. ?Device Orders -  ?Rep Notified -  ? ?Chest x-ray - na ?EKG - 02/03/22 ?Stress Test - faxed [facility] for results ?ECHO - 01/03/18 ?Cardiac Cath - 01/05/18 ? ?Sleep Study - no ?CPAP - no ? ?Fasting Blood Sugar - 120's-130's ?Checks Blood Sugar every 7days ? ?Blood Thinner Instructions: Pt stated his last dose of Plavix was 01/28/22. ?Aspirin Instructions:Pt stated he was instructed to continue aspirin.  ? ?ERAS Protcol -no ?PRE-SURGERY Ensure or G2-  ? ?COVID TEST- na ? ? ?Anesthesia review: yes-cardiac history ? ?Patient denies shortness of breath, fever, cough at PAT appointment. Pt did complain of "light" chest pain at PAT appointment. He was seen by Willeen Cass NP anesthesia NP. EKG performed. No further action needed. Cardiac records requested from [facility]. ? ? ?All instructions explained to the patient, with a verbal understanding of the material. Patient agrees to go over the instructions while at home for a better understanding. Patient also instructed to self quarantine after being tested for COVID-19. The opportunity to ask questions was provided. ?  ?

## 2022-02-04 LAB — HEMOGLOBIN A1C
Hgb A1c MFr Bld: 6.8 % — ABNORMAL HIGH (ref 4.8–5.6)
Mean Plasma Glucose: 148 mg/dL

## 2022-02-04 NOTE — Anesthesia Preprocedure Evaluation (Addendum)
Anesthesia Evaluation  ?Patient identified by MRN, date of birth, ID band ?Patient awake ? ? ? ?Reviewed: ?Allergy & Precautions, NPO status , Patient's Chart, lab work & pertinent test results ? ?History of Anesthesia Complications ?Negative for: history of anesthetic complications ? ?Airway ?Mallampati: I ? ?TM Distance: >3 FB ?Neck ROM: Full ? ? ? Dental ? ?(+) Edentulous Upper, Edentulous Lower ?  ?Pulmonary ?COPD, Current Smoker and Patient abstained from smoking.,  ?  ?breath sounds clear to auscultation ? ? ? ? ? ? Cardiovascular ?hypertension, + angina (weekly, resolved with NTG) + CAD, + CABG, + Peripheral Vascular Disease and +CHF  ?+ Valvular Problems/Murmurs (mild AS) AS  ?Rhythm:Regular Rate:Normal ? ?09/2021 ECHo: EF 35-40%, mild LVH, mod reduced LVF with global hypokinesis, Grade III DD, mild AS mild-mod MR ?  ?Neuro/Psych ?Anxiety negative neurological ROS ?   ? GI/Hepatic ?negative GI ROS, Neg liver ROS,   ?Endo/Other  ?diabetes (glu 151), Oral Hypoglycemic Agents ? Renal/GU ?Renal InsufficiencyRenal disease  ? ?  ?Musculoskeletal ? ? Abdominal ?  ?Peds ? Hematology ?  ?Anesthesia Other Findings ? ? Reproductive/Obstetrics ? ?  ? ? ? ? ? ? ? ? ? ? ? ? ? ?  ?  ? ? ? ? ? ? ?Anesthesia Physical ?Anesthesia Plan ? ?ASA: 4 ? ?Anesthesia Plan: General  ? ?Post-op Pain Management: Tylenol PO (pre-op)*  ? ?Induction: Intravenous ? ?PONV Risk Score and Plan: 1 and Ondansetron and Dexamethasone ? ?Airway Management Planned: Oral ETT ? ?Additional Equipment: Arterial line ? ?Intra-op Plan:  ? ?Post-operative Plan: Extubation in OR ? ?Informed Consent: I have reviewed the patients History and Physical, chart, labs and discussed the procedure including the risks, benefits and alternatives for the proposed anesthesia with the patient or authorized representative who has indicated his/her understanding and acceptance.  ? ? ? ?Dental advisory given ? ?Plan Discussed with: CRNA and  Surgeon ? ?Anesthesia Plan Comments: (See APP note by Durel Salts, FNP )  ? ? ? ? ?Anesthesia Quick Evaluation ? ?

## 2022-02-04 NOTE — Progress Notes (Signed)
Anesthesia Consult: ? ? Case: [id number] Date/Time: 02/05/22 0715  ? Procedure: LEFT CAROTID-SUBCLAVIAN BYPASS GRAFT (Left)  ? Anesthesia type: General  ? Pre-op diagnosis: Vertebrobasilar insufficiency, Left subclavian artery occlusion  ? Location: [facility] ROOM 12 / [facility] OR  ? Surgeons: Serafina Mitchell, MD  ? ?  ? ? ?DISCUSSION: ?Pt is 76 years old with hx:  ?- CAD. S/p CABG 2013. By 2019 cath, severe 3 vessel disease; SVG-D1 occluded, SVG-OM occluded; no targets for revascularization. He is dependent on free LIMA graft to the LAD which supplies collaterals to other vessels ? ?Other history includes:  ?- LBBB ?- CHF (EF is 35-40% by 09/30/21 echo) ?- aortic stenosis (mild by 09/30/21 echo) ?- HTN ?- DM ?- PAD (s/p AOBF in the 1990s.) ?- carotid stenosis (R ICA occlusion; s/p L CEA ~2011 and s/p L stent 2018) ?- lung nodules ? ?Last dose plavix 01/28/22 ? ?Called to see pt in pre-admission testing for pt complaint of recent chest pain. Pt reports 3 episodes of mild chest pain (rates 1/10), each lasting about 5 minutes before spontaneously resolving. First episode 01/29/22 after eating dinner (fried fish); most recent episode 01/31/22 after eating dinner (pork chop). Episodes do not occur with exertion and he did not use nitroglycerin. Denies any other symptoms associated with CP episodes: no SOB, nausea, dizziness, anxiety, palpitations. Denies peripheral edema. On exam, lungs CTA B, HRR, 2/6 systolic murmur heard, no peripheral edema, no carotid bruits. Discussed with pt reasons for seeking emergency care/taking nitro.  ? ?Review of records from [place] (care everywhere) document a visit with PCP Dr. Domenica Fail 12/22/21 in which Dr. Domenica Fail notes pt has "known intermittent chest pain relieved by nitro." Dr. Domenica Fail referred pt to vascular surgery for intervention due to falls/pre-syncope ? ? ?VS: BP 109/61   Pulse 61   Temp 36.5 ?C (Oral)   Resp 18   Ht 6' (1.829 m)   Wt 74.9 kg   BMI 22.41 kg/m?  ? ?PROVIDERS: ?Primary care Charlyne Petrin,  MD) and cardiology care at [facility], [place] ? ? ?LABS: Labs reviewed: Acceptable for surgery. ?- Cr 1.62. This is consistent with prior results. Results in care everywhere from [place] show Cr was 1.61 on 02/02/22 ? ?(all labs ordered are listed, but only abnormal results are displayed) ? ?Labs Reviewed  ?GLUCOSE, CAPILLARY - Abnormal; Notable for the following components:  ?    Result Value  ? Glucose-Capillary 192 (*)   ? All other components within normal limits  ?COMPREHENSIVE METABOLIC PANEL - Abnormal; Notable for the following components:  ? Glucose, Bld 192 (*)   ? Creatinine, Ser 1.62 (*)   ? GFR, Estimated 43 (*)   ? All other components within normal limits  ?URINALYSIS, ROUTINE W REFLEX MICROSCOPIC - Abnormal; Notable for the following components:  ? Color, Urine STRAW (*)   ? Glucose, UA >=500 (*)   ? Hgb urine dipstick SMALL (*)   ? Bacteria, UA RARE (*)   ? All other components within normal limits  ?HEMOGLOBIN A1C - Abnormal; Notable for the following components:  ? Hgb A1c MFr Bld 6.8 (*)   ? All other components within normal limits  ?SURGICAL PCR SCREEN  ?CBC  ?PROTIME-INR  ?APTT  ?TYPE AND SCREEN  ? ? ? ?IMAGES: ?CT angio neck 01/14/22:  ?1. Status post stenting of the left carotid system traversing the distal common carotid artery through the proximal internal carotid artery. The stent is patent, but due to the dilation  of the internal carotid artery distal to the stent, there is not apposition of the distal stent with the carotid artery walls, and there is contrast seen outside the distal end of the stent, consistent with a small endoleak. The internal carotid artery distal to the stent is patent., in the degree of dilation of the internal carotid artery is similar to the prior study. ?2. The stent covers the carotid bifurcation, but the external carotid artery is patent. ?3. Unchanged occlusion of the right internal carotid artery from the bifurcation through the intracranial segment with  reconstitution of flow in the MCA and ACA likely via collateral flow. ?4. Occlusion of the left subclavian artery from just after the origin to just after the takeoff of the left vertebral artery. The occlusion is unchanged, but focal moderate to severe stenosis of the subclavian artery distal to the occlusion has worsened. ?5. Severe stenosis or occlusion of the proximal left vertebral artery with reconstitution of flow in the V2 segment. The proximal stenosis/occlusion has worsened since 2017. ?6. Patent right vertebral artery. ?7. Aortic Atherosclerosis and Emphysema  ? ? ?CT chest (lung cancer screening) 12/21/21 (at [facility] - see care everywhere):  ?- Enlarging 4 mm superior segment RIGHT lower lobe index  ?nodule dependently within a cyst in the RIGHT lower lobe near the  ?major fissure. New subcentimeter nonsolid nodule.  ?RECOMMENDATION: 3 month low-dose CT.  ? ? ?EKG 02/03/22: NSR. LBBB ? ? ?CV: ?Echo 09/30/21 (at [facility] - in care everywhere):  ?- There is mild concentric left ventricular hypertrophy. The left ventricle is mildly dilated. Left ventricular systolic function is moderately reduced. Ejection Fraction = 35-40% (Visual Estimation). EF= 33% by 2D biplane method. There is mild global hypokinesis of the left ventricle. Basal posterolateral wall severe hypokinesis. Proximal mid posteriolateral wall severe hypokinesis. ?Diastolic dysfunction, Grade III (restrictive pattern), consistent with markedly increased left atrial pressure. The E/e'ratio is 20.6. ?- Mild valvular aortic stenosis. The peak aortic valve velocity 222 cm/s ?- There is mild to moderate mitral regurgitation. ?- There is trace tricuspid regurgitation. ?- Right ventricular systolic pressure is estimated at 25 mmHg. ?- Compared to the prior echo report on 02/01/2018, there is no significant change. ? ?Carotid duplex 08/24/21 (at [facility] - in care everywhere): ?- Known right internal carotid artery occlusion. Stenting involving the left common carotid  artery and the carotid bulb as previously reported. There is moderate stenosis (50%-69% stenosis) involving the left carotid bulb and the visualized left internal carotid artery, new since the prior from 07/04/2020.  ?- The left external and distal internal carotid arteries are not definitively visualized, due to technical factors. There is reversal of flow involving the left vertebral artery as previously reported, and the possibility of severe proximal left subclavian artery stenosis or occlusion should be considered.  ? ?Cardiac cath 01/05/18:  ?Ost RCA to Prox RCA lesion is 99% stenosed. ?Prox LAD to Mid LAD lesion is 100% stenosed. ?Ost 1st Diag to 1st Diag lesion is 100% stenosed. ?Prox Cx to Mid Cx lesion is 100% stenosed. ?Dist Cx lesion is 100% stenosed. ?SVG. ?Origin to Prox Graft lesion is 100% stenosed. ?SVG. ?Origin to Prox Graft lesion is 100% stenosed. ?LIMA graft was visualized by angiography and is normal in caliber. ?The graft exhibits no disease. ?LV end diastolic pressure is moderately elevated. ?Conclusion ?1. Severe 3 vessel occlusive CAD ?   - 100% proximal LAD ?   - 100% proximal first diagonal ?   - 100% mid LCx after  OM1. This is a large dominant vessel ?   - 99% ostial nondominant RCA ?2. Patent free LIMA to the LAD. This supplies collaterals to other vessels. ?3. Occluded SVG to first diagonal ?4. Occluded SVG to OM ?5. Moderately elevated LVEDP ?- Plan; there are no targets for revascularization. He is dependent on free LIMA graft to the LAD. Medical management.  ? ? ?Past Medical History:  ?Diagnosis Date  ? Agent orange exposure   ? Anxiety   ? Carotid artery disease ([place])   ? a. s/p carotid endarterectomy remotely.  ? Chronic systolic CHF (congestive heart failure) ([place])   ? COLONIC POLYPS, HX OF 01/29/2011  ? Qualifier: Diagnosis of  By: Jenny Reichmann MD, Hunt Oris   ? Coronary artery disease involving native coronary artery with angina pectoris ([place])   ? a. s/p Multiple PCIs. b. CABG x 3 in 2013  (SVG-OM1, SVG-Diag with freeLIMA-LAD from SVG-Diag hood) --> relook cath in ~2015 with at least 1 graft ~occluded by report.  ? Diabetes mellitus with circulatory complication ([place])   ? ERECTILE DYSFUNCTION 09/23

## 2022-02-05 ENCOUNTER — Encounter (HOSPITAL_COMMUNITY)
Admission: RE | Disposition: A | Discharge status: Home / Self Care | Payer: Self-pay | Source: Home / Self Care | Attending: Surgery

## 2022-02-05 ENCOUNTER — Other Ambulatory Visit: Payer: Self-pay

## 2022-02-05 ENCOUNTER — Inpatient Hospital Stay (HOSPITAL_COMMUNITY)
Admission: RE | Admit: 2022-02-05 | Discharge: 2022-02-06 | DRG: 253 | Disposition: A | Discharge status: Home / Self Care | Payer: No Typology Code available for payment source | Attending: Surgery | Admitting: Surgery

## 2022-02-05 ENCOUNTER — Inpatient Hospital Stay (HOSPITAL_COMMUNITY): Payer: No Typology Code available for payment source | Admitting: Emergency Medicine

## 2022-02-05 ENCOUNTER — Encounter (HOSPITAL_COMMUNITY): Payer: Self-pay | Admitting: Surgery

## 2022-02-05 ENCOUNTER — Inpatient Hospital Stay (HOSPITAL_COMMUNITY): Payer: No Typology Code available for payment source | Admitting: Anesthesiology

## 2022-02-05 DIAGNOSIS — Z8249 Family history of ischemic heart disease and other diseases of the circulatory system: Secondary | ICD-10-CM | POA: Diagnosis not present

## 2022-02-05 DIAGNOSIS — I5022 Chronic systolic (congestive) heart failure: Secondary | ICD-10-CM | POA: Diagnosis present

## 2022-02-05 DIAGNOSIS — I2510 Atherosclerotic heart disease of native coronary artery without angina pectoris: Secondary | ICD-10-CM | POA: Diagnosis not present

## 2022-02-05 DIAGNOSIS — I110 Hypertensive heart disease with heart failure: Secondary | ICD-10-CM

## 2022-02-05 DIAGNOSIS — Z7984 Long term (current) use of oral hypoglycemic drugs: Secondary | ICD-10-CM | POA: Diagnosis not present

## 2022-02-05 DIAGNOSIS — I771 Stricture of artery: Secondary | ICD-10-CM

## 2022-02-05 DIAGNOSIS — G4700 Insomnia, unspecified: Secondary | ICD-10-CM | POA: Diagnosis present

## 2022-02-05 DIAGNOSIS — Z95828 Presence of other vascular implants and grafts: Secondary | ICD-10-CM

## 2022-02-05 DIAGNOSIS — Z20822 Contact with and (suspected) exposure to covid-19: Secondary | ICD-10-CM | POA: Diagnosis present

## 2022-02-05 DIAGNOSIS — Z79899 Other long term (current) drug therapy: Secondary | ICD-10-CM

## 2022-02-05 DIAGNOSIS — F419 Anxiety disorder, unspecified: Secondary | ICD-10-CM | POA: Diagnosis present

## 2022-02-05 DIAGNOSIS — E7800 Pure hypercholesterolemia, unspecified: Secondary | ICD-10-CM | POA: Diagnosis present

## 2022-02-05 DIAGNOSIS — E1151 Type 2 diabetes mellitus with diabetic peripheral angiopathy without gangrene: Secondary | ICD-10-CM | POA: Diagnosis present

## 2022-02-05 DIAGNOSIS — I6521 Occlusion and stenosis of right carotid artery: Secondary | ICD-10-CM | POA: Diagnosis present

## 2022-02-05 DIAGNOSIS — Z885 Allergy status to narcotic agent status: Secondary | ICD-10-CM

## 2022-02-05 DIAGNOSIS — Z951 Presence of aortocoronary bypass graft: Secondary | ICD-10-CM

## 2022-02-05 DIAGNOSIS — I708 Atherosclerosis of other arteries: Secondary | ICD-10-CM | POA: Diagnosis present

## 2022-02-05 DIAGNOSIS — I25119 Atherosclerotic heart disease of native coronary artery with unspecified angina pectoris: Secondary | ICD-10-CM | POA: Diagnosis present

## 2022-02-05 DIAGNOSIS — G450 Vertebro-basilar artery syndrome: Secondary | ICD-10-CM

## 2022-02-05 DIAGNOSIS — Z888 Allergy status to other drugs, medicaments and biological substances status: Secondary | ICD-10-CM

## 2022-02-05 DIAGNOSIS — F4310 Post-traumatic stress disorder, unspecified: Secondary | ICD-10-CM | POA: Diagnosis present

## 2022-02-05 DIAGNOSIS — F17210 Nicotine dependence, cigarettes, uncomplicated: Secondary | ICD-10-CM | POA: Diagnosis present

## 2022-02-05 DIAGNOSIS — Z7902 Long term (current) use of antithrombotics/antiplatelets: Secondary | ICD-10-CM

## 2022-02-05 DIAGNOSIS — Z7982 Long term (current) use of aspirin: Secondary | ICD-10-CM | POA: Diagnosis not present

## 2022-02-05 HISTORY — PX: CAROTID-SUBCLAVIAN BYPASS GRAFT: SHX910

## 2022-02-05 LAB — CBC
HCT: 41.2 % (ref 39.0–52.0)
Hemoglobin: 13.7 g/dL (ref 13.0–17.0)
MCH: 30.4 pg (ref 26.0–34.0)
MCHC: 33.3 g/dL (ref 30.0–36.0)
MCV: 91.6 fL (ref 80.0–100.0)
Platelets: 192 10*3/uL (ref 150–400)
RBC: 4.5 MIL/uL (ref 4.22–5.81)
RDW: 13.6 % (ref 11.5–15.5)
WBC: 11.6 10*3/uL — ABNORMAL HIGH (ref 4.0–10.5)
nRBC: 0 % (ref 0.0–0.2)

## 2022-02-05 LAB — GLUCOSE, CAPILLARY
Glucose-Capillary: 151 mg/dL — ABNORMAL HIGH (ref 70–99)
Glucose-Capillary: 147 mg/dL — ABNORMAL HIGH (ref 70–99)
Glucose-Capillary: 169 mg/dL — ABNORMAL HIGH (ref 70–99)

## 2022-02-05 LAB — POCT ACTIVATED CLOTTING TIME: Activated Clotting Time: 263 seconds

## 2022-02-05 LAB — CREATININE, SERUM
Creatinine, Ser: 1.46 mg/dL — ABNORMAL HIGH (ref 0.61–1.24)
GFR, Estimated: 49 mL/min — ABNORMAL LOW (ref >60)

## 2022-02-05 LAB — ABO/RH: ABO/RH(D): A POS

## 2022-02-05 SURGERY — CREATION, BYPASS, ARTERIAL, SUBCLAVIAN TO CAROTID, USING GRAFT
Anesthesia: General | Laterality: Left

## 2022-02-05 MED ORDER — ONDANSETRON HCL 4 MG/2ML IJ SOLN: 4.0000 mg | Freq: Four times a day (QID) | INTRAMUSCULAR | Status: DC | PRN

## 2022-02-05 MED ORDER — FENTANYL CITRATE (PF) 250 MCG/5ML IJ SOLN
INTRAMUSCULAR | Status: DC | PRN
  Administered 2022-02-05: 25 ug via INTRAVENOUS
  Administered 2022-02-05: 50 ug via INTRAVENOUS
  Administered 2022-02-05: 250 ug via INTRAVENOUS
  Administered 2022-02-05: 25 ug via INTRAVENOUS

## 2022-02-05 MED ORDER — FENTANYL CITRATE (PF) 250 MCG/5ML IJ SOLN
INTRAMUSCULAR | Status: AC
  Filled 2022-02-05: qty 5

## 2022-02-05 MED ORDER — OXYCODONE HCL 5 MG/5ML PO SOLN: 5.0000 mg | Freq: Once | ORAL | Status: DC | PRN

## 2022-02-05 MED ORDER — ROCURONIUM BROMIDE 10 MG/ML (PF) SYRINGE
PREFILLED_SYRINGE | INTRAVENOUS | Status: AC
  Filled 2022-02-05: qty 10

## 2022-02-05 MED ORDER — DOCUSATE SODIUM 100 MG PO CAPS
100.0000 mg | ORAL_CAPSULE | Freq: Every day | ORAL | Status: DC
  Administered 2022-02-06: 100 mg via ORAL
  Filled 2022-02-05: qty 1

## 2022-02-05 MED ORDER — LACTATED RINGERS IV SOLN
INTRAVENOUS | Status: DC
Start: 2022-02-05 — End: 2022-02-05

## 2022-02-05 MED ORDER — CARVEDILOL 6.25 MG PO TABS
6.2500 mg | ORAL_TABLET | Freq: Two times a day (BID) | ORAL | Status: DC
  Administered 2022-02-05 – 2022-02-06 (×2): 6.25 mg via ORAL
  Filled 2022-02-05 (×2): qty 1

## 2022-02-05 MED ORDER — PHENYLEPHRINE HCL-NACL 20-0.9 MG/250ML-% IV SOLN
INTRAVENOUS | Status: DC | PRN
  Administered 2022-02-05: 50 ug/min via INTRAVENOUS
  Administered 2022-02-05: 30 ug/min via INTRAVENOUS

## 2022-02-05 MED ORDER — ALBUTEROL SULFATE (2.5 MG/3ML) 0.083% IN NEBU
2.5000 mg | INHALATION_SOLUTION | Freq: Two times a day (BID) | RESPIRATORY_TRACT | Status: DC
  Administered 2022-02-05: 2.5 mg via RESPIRATORY_TRACT
  Filled 2022-02-05 (×2): qty 3

## 2022-02-05 MED ORDER — LABETALOL HCL 5 MG/ML IV SOLN
INTRAVENOUS | Status: AC
  Filled 2022-02-05: qty 4

## 2022-02-05 MED ORDER — AZELASTINE HCL 0.1 % NA SOLN
2.0000 | Freq: Two times a day (BID) | NASAL | Status: DC | PRN
  Filled 2022-02-05: qty 30

## 2022-02-05 MED ORDER — BISACODYL 5 MG PO TBEC: 5.0000 mg | DELAYED_RELEASE_TABLET | Freq: Every day | ORAL | Status: DC | PRN

## 2022-02-05 MED ORDER — POLYVINYL ALCOHOL 1.4 % OP SOLN
1.0000 [drp] | OPHTHALMIC | Status: DC | PRN
  Filled 2022-02-05: qty 15

## 2022-02-05 MED ORDER — PROPOFOL 10 MG/ML IV BOLUS
INTRAVENOUS | Status: DC | PRN
  Administered 2022-02-05: 70 mg via INTRAVENOUS

## 2022-02-05 MED ORDER — LABETALOL HCL 5 MG/ML IV SOLN
INTRAVENOUS | Status: DC | PRN
Start: 2022-02-05 — End: 2022-02-05
  Administered 2022-02-05 (×3): 5 mg via INTRAVENOUS

## 2022-02-05 MED ORDER — CHLORHEXIDINE GLUCONATE CLOTH 2 % EX PADS: 6.0000 | MEDICATED_PAD | Freq: Once | CUTANEOUS | Status: DC

## 2022-02-05 MED ORDER — ISOSORBIDE DINITRATE 20 MG PO TABS
20.0000 mg | ORAL_TABLET | Freq: Two times a day (BID) | ORAL | Status: DC
  Administered 2022-02-05 – 2022-02-06 (×2): 20 mg via ORAL
  Filled 2022-02-05 (×3): qty 1

## 2022-02-05 MED ORDER — CHLORHEXIDINE GLUCONATE 0.12 % MT SOLN
15.0000 mL | Freq: Once | OROMUCOSAL | Status: AC
  Administered 2022-02-05: 15 mL via OROMUCOSAL
  Filled 2022-02-05: qty 15

## 2022-02-05 MED ORDER — SUGAMMADEX SODIUM 200 MG/2ML IV SOLN
INTRAVENOUS | Status: DC | PRN
  Administered 2022-02-05: 200 mg via INTRAVENOUS

## 2022-02-05 MED ORDER — LORAZEPAM 1 MG PO TABS
1.0000 mg | ORAL_TABLET | Freq: Every day | ORAL | Status: DC
Start: 2022-02-05 — End: 2022-02-06
  Administered 2022-02-05: 1 mg via ORAL
  Filled 2022-02-05: qty 1

## 2022-02-05 MED ORDER — FERROUS SULFATE 325 (65 FE) MG PO TABS: 325.0000 mg | ORAL_TABLET | Freq: Every morning | ORAL | Status: DC

## 2022-02-05 MED ORDER — PROTAMINE SULFATE 10 MG/ML IV SOLN
INTRAVENOUS | Status: AC
  Filled 2022-02-05: qty 5

## 2022-02-05 MED ORDER — POTASSIUM CHLORIDE CRYS ER 20 MEQ PO TBCR
20.0000 meq | EXTENDED_RELEASE_TABLET | ORAL | Status: DC
  Filled 2022-02-05: qty 1

## 2022-02-05 MED ORDER — ALBUTEROL SULFATE HFA 108 (90 BASE) MCG/ACT IN AERS: 2.0000 | INHALATION_SPRAY | Freq: Two times a day (BID) | RESPIRATORY_TRACT | Status: DC

## 2022-02-05 MED ORDER — SODIUM CHLORIDE 0.9 % IV SOLN: INTRAVENOUS | Status: DC

## 2022-02-05 MED ORDER — SURGIFLO WITH THROMBIN (HEMOSTATIC MATRIX KIT) OPTIME
TOPICAL | Status: DC | PRN
  Administered 2022-02-05: 1 via TOPICAL

## 2022-02-05 MED ORDER — PHENYLEPHRINE 40 MCG/ML (10ML) SYRINGE FOR IV PUSH (FOR BLOOD PRESSURE SUPPORT)
PREFILLED_SYRINGE | INTRAVENOUS | Status: DC | PRN
  Administered 2022-02-05 (×2): 160 ug via INTRAVENOUS

## 2022-02-05 MED ORDER — GLYCOPYRROLATE PF 0.2 MG/ML IJ SOSY
PREFILLED_SYRINGE | INTRAMUSCULAR | Status: AC
  Filled 2022-02-05: qty 1

## 2022-02-05 MED ORDER — MIDAZOLAM HCL 2 MG/2ML IJ SOLN: 0.5000 mg | Freq: Once | INTRAMUSCULAR | Status: DC | PRN

## 2022-02-05 MED ORDER — HEMOSTATIC AGENTS (NO CHARGE) OPTIME
TOPICAL | Status: DC | PRN
  Administered 2022-02-05: 1 via TOPICAL

## 2022-02-05 MED ORDER — HEPARIN SODIUM (PORCINE) 1000 UNIT/ML IJ SOLN
INTRAMUSCULAR | Status: DC | PRN
  Administered 2022-02-05: 1000 [IU] via INTRAVENOUS
  Administered 2022-02-05: 8000 [IU] via INTRAVENOUS

## 2022-02-05 MED ORDER — MIRTAZAPINE 15 MG PO TABS
15.0000 mg | ORAL_TABLET | Freq: Every day | ORAL | Status: DC
  Administered 2022-02-05: 15 mg via ORAL
  Filled 2022-02-05: qty 1

## 2022-02-05 MED ORDER — MEPERIDINE HCL 25 MG/ML IJ SOLN: 6.2500 mg | INTRAMUSCULAR | Status: DC | PRN

## 2022-02-05 MED ORDER — PHENYLEPHRINE 40 MCG/ML (10ML) SYRINGE FOR IV PUSH (FOR BLOOD PRESSURE SUPPORT)
PREFILLED_SYRINGE | INTRAVENOUS | Status: AC
  Filled 2022-02-05: qty 10

## 2022-02-05 MED ORDER — SENNOSIDES-DOCUSATE SODIUM 8.6-50 MG PO TABS: 1.0000 | ORAL_TABLET | Freq: Every evening | ORAL | Status: DC | PRN

## 2022-02-05 MED ORDER — FUROSEMIDE 40 MG PO TABS
40.0000 mg | ORAL_TABLET | Freq: Every day | ORAL | Status: DC
Start: 2022-02-05 — End: 2022-02-06
  Administered 2022-02-06: 40 mg via ORAL
  Filled 2022-02-05: qty 1

## 2022-02-05 MED ORDER — ONDANSETRON HCL 4 MG/2ML IJ SOLN
INTRAMUSCULAR | Status: DC | PRN
Start: 2022-02-05 — End: 2022-02-05
  Administered 2022-02-05: 4 mg via INTRAVENOUS

## 2022-02-05 MED ORDER — ROCURONIUM BROMIDE 10 MG/ML (PF) SYRINGE
PREFILLED_SYRINGE | INTRAVENOUS | Status: DC | PRN
  Administered 2022-02-05: 30 mg via INTRAVENOUS
  Administered 2022-02-05: 70 mg via INTRAVENOUS
  Administered 2022-02-05: 30 mg via INTRAVENOUS

## 2022-02-05 MED ORDER — HEPARIN SODIUM (PORCINE) 1000 UNIT/ML IJ SOLN
INTRAMUSCULAR | Status: AC
  Filled 2022-02-05: qty 10

## 2022-02-05 MED ORDER — ONDANSETRON HCL 4 MG/2ML IJ SOLN
INTRAMUSCULAR | Status: AC
  Filled 2022-02-05: qty 2

## 2022-02-05 MED ORDER — ESMOLOL HCL 100 MG/10ML IV SOLN
INTRAVENOUS | Status: AC
  Filled 2022-02-05: qty 10

## 2022-02-05 MED ORDER — ACETAMINOPHEN 500 MG PO TABS: 500.0000 mg | ORAL_TABLET | Freq: Four times a day (QID) | ORAL | Status: DC | PRN

## 2022-02-05 MED ORDER — ESMOLOL HCL 100 MG/10ML IV SOLN
INTRAVENOUS | Status: DC | PRN
  Administered 2022-02-05 (×3): 10 mg via INTRAVENOUS

## 2022-02-05 MED ORDER — 0.9 % SODIUM CHLORIDE (POUR BTL) OPTIME
TOPICAL | Status: DC | PRN
  Administered 2022-02-05 (×2): 1000 mL

## 2022-02-05 MED ORDER — ROSUVASTATIN CALCIUM 20 MG PO TABS
40.0000 mg | ORAL_TABLET | Freq: Every evening | ORAL | Status: DC
Start: 2022-02-06 — End: 2022-02-06

## 2022-02-05 MED ORDER — GUAIFENESIN-DM 100-10 MG/5ML PO SYRP: 15.0000 mL | ORAL_SOLUTION | ORAL | Status: DC | PRN

## 2022-02-05 MED ORDER — ORAL CARE MOUTH RINSE: 15.0000 mL | Freq: Once | OROMUCOSAL | Status: AC

## 2022-02-05 MED ORDER — ACETAMINOPHEN 500 MG PO TABS
1000.0000 mg | ORAL_TABLET | Freq: Once | ORAL | Status: AC
  Administered 2022-02-05: 1000 mg via ORAL
  Filled 2022-02-05: qty 2

## 2022-02-05 MED ORDER — ALBUTEROL SULFATE (2.5 MG/3ML) 0.083% IN NEBU: 2.5000 mg | INHALATION_SOLUTION | Freq: Two times a day (BID) | RESPIRATORY_TRACT | Status: DC

## 2022-02-05 MED ORDER — CEFAZOLIN SODIUM-DEXTROSE 2-4 GM/100ML-% IV SOLN
2.0000 g | INTRAVENOUS | Status: AC
Start: 2022-02-05 — End: 2022-02-05
  Administered 2022-02-05: 2 g via INTRAVENOUS
  Filled 2022-02-05: qty 100

## 2022-02-05 MED ORDER — HEPARIN 6000 UNIT IRRIGATION SOLUTION
Status: DC | PRN
  Administered 2022-02-05: 1

## 2022-02-05 MED ORDER — ACETAMINOPHEN 325 MG RE SUPP
325.0000 mg | RECTAL | Status: DC | PRN
  Filled 2022-02-05: qty 2

## 2022-02-05 MED ORDER — EMPAGLIFLOZIN 10 MG PO TABS
10.0000 mg | ORAL_TABLET | Freq: Every morning | ORAL | Status: DC
  Administered 2022-02-06: 10 mg via ORAL
  Filled 2022-02-05: qty 1

## 2022-02-05 MED ORDER — DEXAMETHASONE SODIUM PHOSPHATE 10 MG/ML IJ SOLN
INTRAMUSCULAR | Status: DC | PRN
  Administered 2022-02-05: 5 mg via INTRAVENOUS

## 2022-02-05 MED ORDER — POTASSIUM CHLORIDE CRYS ER 20 MEQ PO TBCR: 20.0000 meq | EXTENDED_RELEASE_TABLET | Freq: Every day | ORAL | Status: DC | PRN

## 2022-02-05 MED ORDER — ASPIRIN EC 81 MG PO TBEC
81.0000 mg | DELAYED_RELEASE_TABLET | Freq: Every day | ORAL | Status: DC
  Filled 2022-02-05: qty 1

## 2022-02-05 MED ORDER — IPRATROPIUM BROMIDE 0.03 % NA SOLN
2.0000 | Freq: Two times a day (BID) | NASAL | Status: DC | PRN
  Filled 2022-02-05: qty 30

## 2022-02-05 MED ORDER — CEFAZOLIN SODIUM-DEXTROSE 2-4 GM/100ML-% IV SOLN
2.0000 g | Freq: Three times a day (TID) | INTRAVENOUS | Status: AC
  Administered 2022-02-05 (×2): 2 g via INTRAVENOUS
  Filled 2022-02-05 (×2): qty 100

## 2022-02-05 MED ORDER — HYDROMORPHONE HCL 1 MG/ML IJ SOLN: 0.5000 mg | INTRAMUSCULAR | Status: DC | PRN

## 2022-02-05 MED ORDER — INSULIN ASPART 100 UNIT/ML IJ SOLN: 0.0000 [IU] | INTRAMUSCULAR | Status: DC | PRN

## 2022-02-05 MED ORDER — OXYCODONE HCL 5 MG PO TABS: 5.0000 mg | ORAL_TABLET | Freq: Once | ORAL | Status: DC | PRN

## 2022-02-05 MED ORDER — ACETAMINOPHEN 325 MG PO TABS: 325.0000 mg | ORAL_TABLET | ORAL | Status: DC | PRN

## 2022-02-05 MED ORDER — HYDROMORPHONE HCL 1 MG/ML IJ SOLN: 0.2500 mg | INTRAMUSCULAR | Status: DC | PRN

## 2022-02-05 MED ORDER — METFORMIN HCL 500 MG PO TABS
500.0000 mg | ORAL_TABLET | Freq: Two times a day (BID) | ORAL | Status: DC
  Administered 2022-02-05 – 2022-02-06 (×2): 500 mg via ORAL
  Filled 2022-02-05 (×2): qty 1

## 2022-02-05 MED ORDER — ALUM & MAG HYDROXIDE-SIMETH 200-200-20 MG/5ML PO SUSP
15.0000 mL | ORAL | Status: DC | PRN
  Administered 2022-02-05: 15 mL via ORAL
  Filled 2022-02-05: qty 30

## 2022-02-05 MED ORDER — LIDOCAINE HCL (PF) 1 % IJ SOLN
INTRAMUSCULAR | Status: AC
  Filled 2022-02-05: qty 30

## 2022-02-05 MED ORDER — LIDOCAINE 2% (20 MG/ML) 5 ML SYRINGE
INTRAMUSCULAR | Status: DC | PRN
  Administered 2022-02-05: 30 mg via INTRAVENOUS

## 2022-02-05 MED ORDER — HEPARIN SODIUM (PORCINE) 5000 UNIT/ML IJ SOLN
5000.0000 [IU] | Freq: Three times a day (TID) | INTRAMUSCULAR | Status: DC
  Administered 2022-02-06: 5000 [IU] via SUBCUTANEOUS
  Filled 2022-02-05: qty 1

## 2022-02-05 MED ORDER — OXYCODONE-ACETAMINOPHEN 5-325 MG PO TABS: 1.0000 | ORAL_TABLET | ORAL | Status: DC | PRN

## 2022-02-05 MED ORDER — DEXAMETHASONE SODIUM PHOSPHATE 10 MG/ML IJ SOLN
INTRAMUSCULAR | Status: AC
  Filled 2022-02-05: qty 1

## 2022-02-05 MED ORDER — GLYCOPYRROLATE PF 0.2 MG/ML IJ SOSY
PREFILLED_SYRINGE | INTRAMUSCULAR | Status: DC | PRN
  Administered 2022-02-05: .2 mg via INTRAVENOUS

## 2022-02-05 MED ORDER — LABETALOL HCL 5 MG/ML IV SOLN: 10.0000 mg | INTRAVENOUS | Status: DC | PRN

## 2022-02-05 MED ORDER — MAGNESIUM SULFATE 2 GM/50ML IV SOLN: 2.0000 g | Freq: Every day | INTRAVENOUS | Status: DC | PRN

## 2022-02-05 MED ORDER — LOSARTAN POTASSIUM 25 MG PO TABS
25.0000 mg | ORAL_TABLET | Freq: Every day | ORAL | Status: DC
  Administered 2022-02-06: 25 mg via ORAL
  Filled 2022-02-05: qty 1

## 2022-02-05 MED ORDER — GLIPIZIDE 5 MG PO TABS
5.0000 mg | ORAL_TABLET | Freq: Two times a day (BID) | ORAL | Status: DC
Start: 2022-02-05 — End: 2022-02-06
  Administered 2022-02-05 – 2022-02-06 (×2): 5 mg via ORAL
  Filled 2022-02-05 (×3): qty 1

## 2022-02-05 MED ORDER — NITROGLYCERIN 0.4 MG SL SUBL: 0.4000 mg | SUBLINGUAL_TABLET | SUBLINGUAL | Status: DC | PRN

## 2022-02-05 MED ORDER — CLOPIDOGREL BISULFATE 75 MG PO TABS
75.0000 mg | ORAL_TABLET | Freq: Every day | ORAL | Status: DC
  Administered 2022-02-06: 75 mg via ORAL
  Filled 2022-02-05: qty 1

## 2022-02-05 MED ORDER — LIDOCAINE 2% (20 MG/ML) 5 ML SYRINGE
INTRAMUSCULAR | Status: AC
  Filled 2022-02-05: qty 5

## 2022-02-05 MED ORDER — ICOSAPENT ETHYL 1 G PO CAPS
1.0000 g | ORAL_CAPSULE | Freq: Two times a day (BID) | ORAL | Status: DC
  Administered 2022-02-05 – 2022-02-06 (×2): 1 g via ORAL
  Filled 2022-02-05 (×3): qty 1

## 2022-02-05 MED ORDER — PROPOFOL 10 MG/ML IV BOLUS
INTRAVENOUS | Status: AC
  Filled 2022-02-05: qty 20

## 2022-02-05 MED ORDER — HEPARIN 6000 UNIT IRRIGATION SOLUTION
Status: AC
  Filled 2022-02-05: qty 500

## 2022-02-05 MED ORDER — PANTOPRAZOLE SODIUM 40 MG PO TBEC
40.0000 mg | DELAYED_RELEASE_TABLET | Freq: Every day | ORAL | Status: DC
  Administered 2022-02-06: 40 mg via ORAL
  Filled 2022-02-05: qty 1

## 2022-02-05 MED ORDER — SODIUM CHLORIDE 0.9 % IV SOLN: 500.0000 mL | Freq: Once | INTRAVENOUS | Status: DC | PRN

## 2022-02-05 MED ORDER — PHENOL 1.4 % MT LIQD: 1.0000 | OROMUCOSAL | Status: DC | PRN

## 2022-02-05 MED ORDER — METOPROLOL TARTRATE 5 MG/5ML IV SOLN: 2.0000 mg | INTRAVENOUS | Status: DC | PRN

## 2022-02-05 MED ORDER — CARBOXYMETHYLCELLULOSE SODIUM 0.5 % OP SOLN: 1.0000 [drp] | Freq: Three times a day (TID) | OPHTHALMIC | Status: DC | PRN

## 2022-02-05 MED ORDER — PROTAMINE SULFATE 10 MG/ML IV SOLN
INTRAVENOUS | Status: DC | PRN
  Administered 2022-02-05: 50 mg via INTRAVENOUS

## 2022-02-05 MED ORDER — HYDRALAZINE HCL 20 MG/ML IJ SOLN: 5.0000 mg | INTRAMUSCULAR | Status: DC | PRN

## 2022-02-05 SURGICAL SUPPLY — 46 items
ADH SKN CLS APL DERMABOND .7 (GAUZE/BANDAGES/DRESSINGS) ×1
AGENT HMST KT MTR STRL THRMB (HEMOSTASIS) ×1
BAG COUNTER SPONGE SURGICOUNT (BAG) ×3 IMPLANT
BAG DECANTER FOR FLEXI CONT (MISCELLANEOUS) ×2 IMPLANT
BAG SPNG CNTER NS LX DISP (BAG) ×1
CANISTER SUCT 3000ML PPV (MISCELLANEOUS) ×2 IMPLANT
CLIP VESOCCLUDE MED 24/CT (CLIP) ×2 IMPLANT
CLIP VESOCCLUDE SM WIDE 24/CT (CLIP) ×2 IMPLANT
DERMABOND ADVANCED (GAUZE/BANDAGES/DRESSINGS) ×1
DERMABOND ADVANCED .7 DNX12 (GAUZE/BANDAGES/DRESSINGS) ×2 IMPLANT
DRAIN CHANNEL 15F RND FF W/TCR (WOUND CARE) ×1 IMPLANT
ELECT REM PT RETURN 9FT ADLT (ELECTROSURGICAL) ×2
ELECTRODE REM PT RTRN 9FT ADLT (ELECTROSURGICAL) ×2 IMPLANT
EVACUATOR SILICONE 100CC (DRAIN) ×1 IMPLANT
GAUZE SPONGE 2X2 8PLY STRL LF (GAUZE/BANDAGES/DRESSINGS) IMPLANT
GAUZE SPONGE 4X4 12PLY STRL (GAUZE/BANDAGES/DRESSINGS) ×2 IMPLANT
GLOVE SRG 8 PF TXTR STRL LF DI (GLOVE) ×1 IMPLANT
GLOVE SURG POLYISO LF SZ7.5 (GLOVE) ×2 IMPLANT
GLOVE SURG UNDER POLY LF SZ8 (GLOVE) ×2
GOWN STRL REUS W/ TWL LRG LVL3 (GOWN DISPOSABLE) ×2 IMPLANT
GOWN STRL REUS W/ TWL XL LVL3 (GOWN DISPOSABLE) ×2 IMPLANT
GOWN STRL REUS W/TWL LRG LVL3 (GOWN DISPOSABLE) ×4
GOWN STRL REUS W/TWL XL LVL3 (GOWN DISPOSABLE) ×2
GRAFT CV STRG 30X7KNIT SFT (Vascular Products) IMPLANT
GRAFT HEMASHIELD 7MM (Vascular Products) ×2 IMPLANT
HEMOSTAT SNOW SURGICEL 2X4 (HEMOSTASIS) ×1 IMPLANT
KIT BASIN OR (CUSTOM PROCEDURE TRAY) ×3 IMPLANT
KIT DRAIN CSF ACCUDRAIN (MISCELLANEOUS) IMPLANT
KIT TURNOVER KIT B (KITS) ×2 IMPLANT
NS IRRIG 1000ML POUR BTL (IV SOLUTION) ×6 IMPLANT
PACK CAROTID (CUSTOM PROCEDURE TRAY) ×3 IMPLANT
PAD ARMBOARD 7.5X6 YLW CONV (MISCELLANEOUS) ×6 IMPLANT
PENCIL BUTTON HOLSTER BLD 10FT (ELECTRODE) ×2 IMPLANT
POSITIONER HEAD DONUT 9IN (MISCELLANEOUS) ×3 IMPLANT
PUNCH AORTIC ROTATE 5MM 8IN (MISCELLANEOUS) ×1 IMPLANT
SPONGE GAUZE 2X2 STER 10/PKG (GAUZE/BANDAGES/DRESSINGS) ×1
SURGIFLO W/THROMBIN 8M KIT (HEMOSTASIS) ×1 IMPLANT
SUT ETHILON 3 0 PS 1 (SUTURE) ×1 IMPLANT
SUT PROLENE 5 0 C 1 24 (SUTURE) ×6 IMPLANT
SUT PROLENE 6 0 BV (SUTURE) ×2 IMPLANT
SUT VIC AB 3-0 SH 27 (SUTURE) ×4
SUT VIC AB 3-0 SH 27X BRD (SUTURE) ×2 IMPLANT
SUT VIC AB 3-0 X1 27 (SUTURE) ×3 IMPLANT
TOWEL GREEN STERILE (TOWEL DISPOSABLE) ×3 IMPLANT
TRAY FOLEY MTR SLVR 16FR STAT (SET/KITS/TRAYS/PACK) ×2 IMPLANT
WATER STERILE IRR 1000ML POUR (IV SOLUTION) ×3 IMPLANT

## 2022-02-05 NOTE — Progress Notes (Signed)
Patient displayed equal bilateral strength in hands and feet, tongue midline; pre-op and post-op. 

## 2022-02-05 NOTE — Op Note (Addendum)
? ? ?Patient name: Jesse Hansen MRN: [id number] DOB: January 30, 1946 Sex: adult ? ?02/05/2022 ?Pre-operative Diagnosis: Vertebrobasilar insufficiency ?Post-operative diagnosis:  Same ?Surgeon:  Annamarie Major ?Assistants:  Laurence Slate, PA ?Procedure:   #1: Left carotid subclavian bypass ?  #2: Exposure of left vertebral artery ?Anesthesia:  General ?Blood Loss:  100 cc ?Specimens:  none ? ?Findings: A 7 mm dacryon graft was used for bypass between the left subclavian and left carotid artery.  I exposed the vertebral artery beginning at its origin for approximately 1.5 cm.  It was very small and I did not feel it was a suitable candidate for reimplantation.  There was a brisk signal within the vertebral artery after the case ? ?Indications: This is a 76 year old gentleman who has been following an getting dizzy.  He has an occluded left subclavian.  I have been concerned about vertebrobasilar insufficiency.  We talked about carotid subclavian bypass graft to help with this.  Risks and benefits were discussed.  Informed consent was obtained. ? ?Procedure:  The patient was identified in the holding area and taken to [facility]  The patient was then placed supine on the table. general anesthesia was administered.  The patient was prepped and draped in the usual sterile fashion.  A time out was called and antibiotics were administered.  A PA was necessary to expedite the procedure and assist with technical details. ? ?I began by making a supraclavicular incision beginning at the sternal head of the sternocleidomastoid and extending laterally for approximately 5 cm cautery was used divide subcutaneous tissue and platysma muscle.  I then developed a plane between the 2 heads of the sternocleidomastoid and identified the internal jugular vein.  The vagus nerve was coursing anterior medially.  This was mobilized and protected.  I then proceeded to dissect out the common carotid artery which was relatively disease-free.  Next,  dissection began on the lateral border of the internal jugular vein.  The scalene fat pad was reflected laterally, ligating anything that appeared to be a lymphatic vessel.  I then identified the thoracic duct and divided this between silk ties.  Ultimately, I exposed the anterior scalene.  I visualized the phrenic nerve which was coursing along the medial edge.  This was protected throughout.  I then divided the anterior scalene with cautery.  The PA helped with division of the muscle.  The subclavian artery was then readily visible on the anterior scalene.  It was mobilized as far laterally as possible.  Medially I dissected out up to the thyrocervical trunk.  The artery was mildly diseased. ? ?I then began dissecting between the carotid and the jugular vein.  I identified the vertebral vein and divided this between silk ties.  I then identified the subclavian artery within this plane and traced it proximally until I identified the vertebral artery origin.  I then dissected out the vertebral artery for approximately 1.5 cm.  This is a very small artery approximately 2 mm.  Next, the patient was fully heparinized.  Heparin levels were redosed based off ACT measurements throughout the case.  The subclavian artery was then occluded and the position below the anterior scalene with vascular clamps.  A #11 blade was used to make an arteriotomy which was extended longitudinally with Potts scissors.  A 7 mm dacryon graft was then beveled to fit the size the arteriotomy in a running anastomosis was created with 6-0 Prolene.  Prior to completion appropriate flushing maneuvers were performed and  the anastomosis was completed.  The graft was then brought up posterior to the internal jugular vein.  I marked a spot on the lateral posterior side of the common carotid artery.  The carotid artery was then occluded with vascular clamps.  It was opened with a #11 blade followed by a #5 punch.  The graft was then cut the appropriate  length and beveled to fit the size the arteriotomy.  A running anastomosis was created with 6-0 Prolene.  Prior to completion, appropriate flushing was performed and then sequential D clamping was done.  The PA was necessary for suture following as well as suctioning and retraction for adequate exposure.  Hand-held Doppler was used to confirm excellent signals in the carotid and subclavian arteries.  I then evaluated the vertebral artery with Doppler and it had a brisk signal.  I did not think that it was of adequate diameter for reimplantation or bypass.  Since it had a good signal I elected not to address it at this time.  The patient's heparin was then reversed with 50 mg of protamine.  The wound was irrigated.  Hemostasis was achieved.  There was some oozing, likely from his antiplatelet medication.  I elected to place a 77 Blake drain within the incision which was brought out through a stab incision.  The PA sutured the drain in place.  At this point, I was satisfied with hemostasis.  The platysma muscle was reapproximated 3-0 Vicryl and the skin was closed with 3-0 Vicryl followed by Dermabond.  Sterile dressings were applied.  The patient was successfully awakened from anesthesia, and found to be neurologically intact.  He was taken recovery in stable condition.  There were no immediate complications.   ? ? ?Disposition: To PACU stable. ? ? ?V. Annamarie Major, M.D., FACS ?[facility] ?Office: [phone] ?Pager:  [phone]  ?

## 2022-02-05 NOTE — Interval H&P Note (Signed)
History and Physical Interval Note: ? ?02/05/2022 ?7:28 AM ? ?Jesse Hansen  has presented today for surgery, with the diagnosis of Vertebrobasilar insufficiency, Left subclavian artery occlusion.  The various methods of treatment have been discussed with the patient and family. After consideration of risks, benefits and other options for treatment, the patient has consented to  Procedure(s): ?LEFT CAROTID-SUBCLAVIAN BYPASS GRAFT (Left) as a surgical intervention.  The patient's history has been reviewed, patient examined, no change in status, stable for surgery.  I have reviewed the patient's chart and labs.  Questions were answered to the patient's satisfaction.   ? ? ?[name] ? ? ?

## 2022-02-05 NOTE — Progress Notes (Signed)
Status post left carotid subclavian bypass ?Resting comfortably in PACU. ?Neurologically intact.  Palpable left radial pulse.  Approximately 10 cc within the drain. ?Patient is stable for transfer to 4 E. ? ?Annamarie Major ?

## 2022-02-05 NOTE — Transfer of Care (Signed)
Immediate Anesthesia Transfer of Care Note ? ?Patient: Jesse Hansen ? ?Procedure(s) Performed: LEFT CAROTID-SUBCLAVIAN BYPASS GRAFT (Left) ? ?Patient Location: PACU ? ?Anesthesia Type:General ? ?Level of Consciousness: drowsy ? ?Airway & Oxygen Therapy: Patient Spontanous Breathing and Patient connected to face mask oxygen ? ?Post-op Assessment: Report given to RN and Post -op Vital signs reviewed and stable ? ?Post vital signs: Reviewed and stable ? ?Last Vitals:  ?Vitals Value Taken Time  ?BP 130/74 02/05/22 1210  ?Temp    ?Pulse 67 02/05/22 1216  ?Resp 12 02/05/22 1216  ?SpO2 93 % 02/05/22 1216  ?Vitals shown include unvalidated device data. ? ?Last Pain:  ?Vitals:  ? 02/05/22 0621  ?TempSrc:   ?PainSc: 0-No pain  ?   ? ?  ? ?Complications: No notable events documented. ?

## 2022-02-05 NOTE — Anesthesia Postprocedure Evaluation (Signed)
Anesthesia Post Note ? ?Patient: Jesse Hansen ? ?Procedure(s) Performed: LEFT CAROTID-SUBCLAVIAN BYPASS GRAFT (Left) ? ?  ? ?Patient location during evaluation: PACU ?Anesthesia Type: General ?Level of consciousness: awake and alert, oriented and patient cooperative ?Pain management: pain level controlled ?Vital Signs Assessment: post-procedure vital signs reviewed and stable ?Respiratory status: spontaneous breathing, nonlabored ventilation, respiratory function stable and patient connected to nasal cannula oxygen ?Cardiovascular status: blood pressure returned to baseline and stable ?Postop Assessment: no apparent nausea or vomiting ?Anesthetic complications: no ? ? ?No notable events documented. ? ?Last Vitals:  ?Vitals:  ? 02/05/22 1240 02/05/22 1255  ?BP: (!) 127/54 138/71  ?Pulse: 71 74  ?Resp: 11 13  ?Temp:    ?SpO2: 95% 96%  ?  ?Last Pain:  ?Vitals:  ? 02/05/22 1240  ?TempSrc:   ?PainSc: 0-No pain  ? ? ?  ?  ?  ?  ?  ?  ? ?Keithon Mccoin,E. Pranav Lince ? ? ? ? ?

## 2022-02-05 NOTE — Anesthesia Procedure Notes (Signed)
Procedure Name: Intubation ?Date/Time: 02/05/2022 8:37 AM ?Performed by: Lorie Phenix, CRNA ?Pre-anesthesia Checklist: Patient identified, Emergency Drugs available, Suction available and Patient being monitored ?Patient Re-evaluated:Patient Re-evaluated prior to induction ?Oxygen Delivery Method: Circle system utilized ?Preoxygenation: Pre-oxygenation with 100% oxygen ?Induction Type: IV induction ?Ventilation: Mask ventilation without difficulty ?Laryngoscope Size: Mac and 4 ?Grade View: Grade I ?Tube type: Oral ?Number of attempts: 1 ?Airway Equipment and Method: Stylet ?Placement Confirmation: ETT inserted through vocal cords under direct vision, positive ETCO2 and breath sounds checked- equal and bilateral ?Secured at: 24 cm ?Tube secured with: Tape ?Dental Injury: Teeth and Oropharynx as per pre-operative assessment  ? ? ? ? ?

## 2022-02-05 NOTE — Anesthesia Procedure Notes (Addendum)
Arterial Line Insertion ?Start/End3/17/2023 7:00 AM, 02/05/2022 7:45 AM ?Performed by: Annye Asa, MD, anesthesiologist ? Patient location: Pre-op. ?Preanesthetic checklist: patient identified, IV checked, site marked, risks and benefits discussed, surgical consent, monitors and equipment checked, pre-op evaluation, timeout performed and anesthesia consent ?Lidocaine 1% used for infiltration ?Right, brachial was placed ?Catheter size: 20 G ?Hand hygiene performed  and maximum sterile barriers used  ? ?Attempts: 3 ?Procedure performed using ultrasound guided technique. ?Ultrasound Notes:anatomy identified, needle tip was noted to be adjacent to the nerve/plexus identified, no ultrasound evidence of intravascular and/or intraneural injection and image(s) printed for medical record ?Following insertion, dressing applied and Biopatch. ?Post procedure assessment: normal ? ?Patient tolerated the procedure well with no immediate complications. ?Additional procedure comments: Difficulty placing arterial line in right radial artery. U/S used by Dr. Glennon Mac, successful placement, but unable to pull back later. Radial removed and pressure held. [place] placed on right brachial artery with U/Sguidance. . ? ? ? ? ?

## 2022-02-05 NOTE — TOC Initial Note (Signed)
Transition of Care (TOC) - Initial/Assessment Note  ? ? ?Patient Details  ?Name: Jesse Hansen ?MRN: [id number] ?Date of Birth: 1946/06/28 ? ?Transition of Care (TOC) CM/SW Contact:    ?Verdell Carmine, RN ?Phone Number: ?02/05/2022, 4:50 PM ? ?Clinical Narrative:                 ? ?The Transition of Care Department [facility]) has reviewed patient and no TOC needs have been identified at this time. We will continue to monitor patient advancement through interdisciplinary progression rounds. If new patient transition needs arise, please place a TOC consult ?  ?  ? ? ?Patient Goals and CMS Choice ?  ?  ?  ? ?Expected Discharge Plan and Services ?  ?  ?  ?  ?  ?                ?  ?  ?  ?  ?  ?  ?  ?  ?  ?  ? ?Prior Living Arrangements/Services ?  ?  ?  ?       ?  ?  ?  ?  ? ?Activities of Daily Living ?  ?ADL Screening (condition at time of admission) ?Patient's cognitive ability adequate to safely complete daily activities?: Yes ?Is the patient deaf or have difficulty hearing?: Yes ?Does the patient have difficulty seeing, even when wearing glasses/contacts?: No ?Does the patient have difficulty concentrating, remembering, or making decisions?: No ?Patient able to express need for assistance with ADLs?: Yes ?Does the patient have difficulty dressing or bathing?: No ?Independently performs ADLs?: Yes (appropriate for developmental age) ?Does the patient have difficulty walking or climbing stairs?: No ?Weakness of Legs: Both ?Weakness of Arms/Hands: None ? ?Permission Sought/Granted ?  ?  ?   ?   ?   ?   ? ?Emotional Assessment ?  ?  ?  ?  ?  ?  ? ?Admission diagnosis:  Left subclavian artery occlusion [I70.8] ?Patient Active Problem List  ? Diagnosis Date Noted  ? 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  ? Abrasion of other part of head, initial encounter 04/27/2021  ? Actinic keratosis 04/27/2021  ? Adenomatous polyp of colon 04/27/2021  ? 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 Chronic kidney disease, stage 3b  ([place]) 04/27/2021  ? 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 Multiple nodules of lung 04/27/2021  ? 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 Occlusion and stenosis of carotid artery 04/27/2021  ? Postsurgical percutaneous transluminal coronary angioplasty status 04/27/2021  ? Skin cancer 04/27/2021  ? Syncope and collapse 04/27/2021  ? Transient cerebral ischemia 04/27/2021  ? 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 Encounter for screening for malignant neoplasm of respiratory organs 04/27/2021  ? Other specified counseling 04/27/2021  ? Encounter for well adult exam with abnormal findings 04/27/2021  ? Chest pain 06/23/2020  ? Non-ST elevation (NSTEMI) myocardial infarction [facility])   ? Tobacco abuse 01/03/2018  ? Influenza 01/03/2018  ? Lactic acidosis 01/03/2018  ? Hypokalemia 01/03/2018  ? Hypotension 01/03/2018  ? AKI (acute kidney injury) ([place]) 01/02/2018  ? Carotid stenosis 03/24/2017  ? Generalized ischemic myocardial dysfunction 01/27/2016  ? Aortic valve stenosis 01/27/2016  ? CAP (community acquired pneumonia) 01/26/2016  ? Chronic obstructive pulmonary disease with acute exacerbation ([place]) 01/26/2016  ? Acute congestive heart failure ([place]) 01/26/2016  ? History of coronary artery bypass surgery 01/26/2016  ? Agent orange exposure 01/26/2016  ? Left bundle branch block (LBBB) 01/26/2016  ?  Stable angina ([place]) 01/26/2016  ? Accidental exposure to phenoxyacid derivative herbicide 01/26/2016  ? Elevated troponin 01/25/2016  ? Insomnia 03/02/2013  ? Encounter for immunization 02/28/2012  ? Type 2 diabetes mellitus without complications ([place]) 09/07/5101  ? COLONIC POLYPS, HX OF 01/29/2011  ? 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 Anxiety state 08/23/2009  ? Hyperlipidemia 02/23/2008  ? Atherosclerotic heart disease of native coronary artery with other forms of angina pectoris ([place]) 02/23/2008  ? Peripheral vascular disease ([place]) 02/23/2008  ? INSOMNIA-SLEEP DISORDER-UNSPEC 02/23/2008  ? Coronary  artery abnormality 02/23/2008  ? ERECTILE DYSFUNCTION 08/15/2007  ? Essential hypertension 08/15/2007  ? ?PCP:  [facility] ?Pharmacy:   ?[place], [place] [address] ?[phone] ?[place] [address] ?Phone: [phone] Fax: [phone] ? ?[facility] [place], [place] - [address] ?[address] ?[place] [address] ?Phone: [phone] Fax: [phone] ? ? ? ? ?Social Determinants of Health (SDOH) Interventions ?  ? ?Readmission Risk Interventions ?No flowsheet data found. ? ? ?

## 2022-02-06 LAB — LIPID PANEL
Cholesterol: 137 mg/dL (ref 0–200)
HDL: 29 mg/dL — ABNORMAL LOW (ref >40)
LDL Cholesterol: 92 mg/dL (ref 0–99)
Total CHOL/HDL Ratio: 4.7 RATIO
Triglycerides: 81 mg/dL (ref <150)
VLDL: 16 mg/dL (ref 0–40)

## 2022-02-06 LAB — BASIC METABOLIC PANEL
Anion gap: 9 (ref 5–15)
BUN: 21 mg/dL (ref 8–23)
CO2: 22 mmol/L (ref 22–32)
Calcium: 8.6 mg/dL — ABNORMAL LOW (ref 8.9–10.3)
Chloride: 105 mmol/L (ref 98–111)
Creatinine, Ser: 1.52 mg/dL — ABNORMAL HIGH (ref 0.61–1.24)
GFR, Estimated: 47 mL/min — ABNORMAL LOW (ref >60)
Glucose, Bld: 158 mg/dL — ABNORMAL HIGH (ref 70–99)
Potassium: 3.9 mmol/L (ref 3.5–5.1)
Sodium: 136 mmol/L (ref 135–145)

## 2022-02-06 LAB — CBC
HCT: 38.4 % — ABNORMAL LOW (ref 39.0–52.0)
Hemoglobin: 13.5 g/dL (ref 13.0–17.0)
MCH: 31.3 pg (ref 26.0–34.0)
MCHC: 35.2 g/dL (ref 30.0–36.0)
MCV: 88.9 fL (ref 80.0–100.0)
Platelets: 200 10*3/uL (ref 150–400)
RBC: 4.32 MIL/uL (ref 4.22–5.81)
RDW: 13.5 % (ref 11.5–15.5)
WBC: 10.9 10*3/uL — ABNORMAL HIGH (ref 4.0–10.5)
nRBC: 0 % (ref 0.0–0.2)

## 2022-02-06 LAB — GLUCOSE, CAPILLARY: Glucose-Capillary: 176 mg/dL — ABNORMAL HIGH (ref 70–99)

## 2022-02-06 MED ORDER — ALBUTEROL SULFATE HFA 108 (90 BASE) MCG/ACT IN AERS: 2.0000 | INHALATION_SPRAY | Freq: Two times a day (BID) | RESPIRATORY_TRACT | Status: DC

## 2022-02-06 MED ORDER — ALBUTEROL SULFATE (2.5 MG/3ML) 0.083% IN NEBU: 2.5000 mg | INHALATION_SOLUTION | Freq: Two times a day (BID) | RESPIRATORY_TRACT | Status: DC

## 2022-02-06 MED ORDER — EZETIMIBE 10 MG PO TABS
10.0000 mg | ORAL_TABLET | Freq: Every day | ORAL | Status: DC
  Administered 2022-02-06: 10 mg via ORAL
  Filled 2022-02-06: qty 1

## 2022-02-06 MED ORDER — OXYCODONE-ACETAMINOPHEN 5-325 MG PO TABS: 1.0000 | ORAL_TABLET | Freq: Four times a day (QID) | ORAL | 0 refills | Status: DC | PRN

## 2022-02-06 NOTE — Progress Notes (Signed)
Patient walked approximately 200 ft. Patient tolerated well. independent  ?

## 2022-02-06 NOTE — Progress Notes (Signed)
PHARMACIST LIPID MONITORING ? ? ?Jesse Hansen is a 76 y.o. 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  Pharmacy has been consulted to optimize lipid-lowering therapy with the indication of secondary prevention for clinical ASCVD. ? ?Recent Labs: ? ?Lipid Panel (last 6 months):   ?Lab Results  ?Component Value Date  ? CHOL 137 02/06/2022  ? TRIG 81 02/06/2022  ? HDL 29 (L) 02/06/2022  ? CHOLHDL 4.7 02/06/2022  ? VLDL 16 02/06/2022  ? [place] 92 02/06/2022  ? ? ?Hepatic function panel (last 6 months):   ?Lab Results  ?Component Value Date  ? AST 21 02/03/2022  ? ALT 21 02/03/2022  ? ALKPHOS 50 02/03/2022  ? BILITOT 0.9 02/03/2022  ? ? ?SCr (since admission):   ?Serum creatinine: 1.52 mg/dL (H) 02/06/22 0248 ?Estimated creatinine clearance: 35.8 mL/min (A) (Male) ?Estimated creatinine clearance: 43.1 mL/min (A) (Male) ? ?Current therapy and lipid therapy tolerance ?Current lipid-lowering therapy: Rosuvastatin 40 mg  ?Previous lipid-lowering therapies (if applicable): n/a ?Documented or reported allergies or intolerances to lipid-lowering therapies (if applicable): niacin ? ?Assessment:   ?Patient agrees with changes to lipid-lowering therapy ? ?Plan:   ? ?1.Statin intensity (high intensity recommended for all patients regardless of the LDL):  No statin changes. The patient is already on a high intensity statin. ? ?2.Add ezetimibe (if any one of the following):   On a high intensity statin with LDL > 70. ? ?3.Refer to lipid clinic:   No ? ?4.Follow-up with:  Primary care provider - [facility], [facility] ? ?5.Follow-up labs after discharge:  Changes in lipid therapy were made. Check a lipid panel in 8-12 weeks then annually.    ? ?Thank you for allowing pharmacy to participate in this patient's care. ? ?Reatha Harps, PharmD ?PGY1 Pharmacy Resident ?02/06/2022 10:08 AM ?Check [email or web address] for unit specific pharmacy number ? ?

## 2022-02-06 NOTE — Discharge Summary (Signed)
Physician Discharge Summary  ?Patient ID: ?Jesse Hansen ?MRN: [id number] ?DOB/AGE: 77-24-1947 77 y.o. ? ?Admit date: 02/05/2022 ?Discharge date: 02/06/2022 ? ?Admission Diagnoses: left subclavian artery occlusion ? ?Discharge Diagnoses:  ?Principal Problem: ?  Left subclavian artery occlusion ? ? ?Discharged Condition: good ? ?Hospital Course: Jesse Hansen is a 76 y.o. male who has a history of a left carotid endarterectomy at the [facility] in [place].  His postoperative course was complicated by dysphagia and hoarseness which eventually resolved.  He was found to have recurrent stenosis at the endarterectomy site.  He underwent left carotid stenting on 05/09/2017.  He has a known right carotid artery occlusion.  He is also undergone aortic reconstruction by Dr. Donnetta Hutching. ? ?A outside ultrasound showed that there was potential in-stent stenosis.  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  Each time, he has been using his arms over his head. ?  ?The patient is medically managed for hypercholesterolemia.  He is on dual antiplatelet therapy.  He is a diabetic.  He is medically managed for hypertension ?  ?I sent him for CT angiogram to further evaluate the stenosis within his left carotid stent and for surgical planning. ? ?Patient underwent left carotid to subclavian artery bypass on 02/05/22. He tolerated this well. He had a fair amount of ooze from the operative procedure and so a drain was left in place. This had a generous amount of serosanguinous output, and so was left in place POD1. The patient desired discharge with short interval follow up for drain removal. I felt this was appropriate.  ? ?Consults: None ? ?Significant Diagnostic Studies: none ? ?Treatments: left carotid subclavian bypass as above ? ?Discharge Exam: ?Blood pressure (!) 146/81, pulse 76, temperature 98.2 ?F (36.8 ?C), temperature source Oral, resp. rate 20, height 6' (1.829 m), weight  74.8 kg, SpO2 97 %. ?Cardiac:  regular ?Lungs:  non labored ?Incisions:  left neck incision is clean, dry and intact without swelling or hematoma. Drain with 50 cc serosanguinous output ?Extremities:  moving all extremities without deficits.2+ left radial pulse. Left hand warm and well perfused ?Neurologic: neurologically intact. Speech coherent. Tongue midline. Smile symmetric ? ?Disposition: Discharge disposition: 01-Home or Self Care ? ? ? ? ? ? ?Discharge Instructions   ? ? Call MD for:  redness, tenderness, or signs of infection (pain, swelling, bleeding, redness, odor or green/yellow discharge around incision site)   Complete by: As directed ?  ? Call MD for:  severe or increased pain, loss or decreased feeling  in affected limb(s)   Complete by: As directed ?  ? Call MD for:  temperature >100.5   Complete by: As directed ?  ? Discharge patient   Complete by: As directed ?  ? Discharge disposition: 01-Home or Self Care  ? Discharge patient date: 02/06/2022  ? Discharge wound care:   Complete by: As directed ?  ? Empty drain twice daily and as needed. Record the date, time, and volume emptied. Follow up with Dr. Trula Slade Monday for drain removal.  ? Driving Restrictions   Complete by: As directed ?  ? No driving while taking narcotic pain medicine.  ? Resume previous diet   Complete by: As directed ?  ? ?  ? ?Allergies as of 02/06/2022   ? ?   Reactions  ? Codeine Nausea And Vomiting, Itching  ? Other reaction(s): Paresthesia, Finding of vomiting  ? Nicotine   ?  Nicotine Patch--Per Nurse "heart issues"  ? Niacin Other (See Comments)  ? unknown  ? ?  ? ?  ?Medication List  ?  ? ?TAKE these medications   ? ?acetaminophen 500 MG tablet ?Commonly known as: TYLENOL ?Take 500-1,000 mg by mouth every 6 (six) hours as needed (for pain.). ?  ?albuterol 108 (90 Base) MCG/ACT inhaler ?Commonly known as: VENTOLIN HFA ?Inhale 2 puffs into the lungs 2 (two) times daily. ?  ?ammonium lactate 12 % lotion ?Commonly known as:  LAC-HYDRIN ?Apply 1 application. topically as needed for dry skin. ?  ?aspirin EC 81 MG tablet ?Take 1 tablet (81 mg total) by mouth daily. ?What changed: when to take this ?  ?azelastine 0.1 % nasal spray ?Commonly known as: ASTELIN ?Place 2 sprays into both nostrils 2 (two) times daily as needed (runny nose). ?  ?BLACK WALNUT POLLEN [place] ?Place 1 drop under the tongue every morning. ?  ?carboxymethylcellulose 0.5 % Soln ?Commonly known as: REFRESH PLUS ?Place 1 drop into both eyes 3 (three) times daily as needed (dry/irritated eyes). ?  ?carvedilol 6.25 MG tablet ?Commonly known as: COREG ?Take 6.25 mg by mouth 2 (two) times daily with a meal. ?  ?clopidogrel 75 MG tablet ?Commonly known as: PLAVIX ?Take 75 mg by mouth daily. ?  ?CoQ10 50 MG Caps ?Take 50 mg by mouth every morning. ?  ?ELDERBERRY PO ?Take 1 capsule by mouth daily. ?  ?empagliflozin 25 MG Tabs tablet ?Commonly known as: JARDIANCE ?Take 12.5 mg by mouth in the morning. ?  ?ferrous sulfate 325 (65 FE) MG tablet ?Take 325 mg by mouth every morning. ?  ?furosemide 40 MG tablet ?Commonly known as: LASIX ?Take 1 tablet (40 mg total) by mouth daily. ?What changed: when to take this ?  ?glipiZIDE 5 MG tablet ?Commonly known as: GLUCOTROL ?Take 5 mg by mouth 2 (two) times daily before a meal. Take 30 minutes prior to meals. ?  ?icosapent Ethyl 1 g capsule ?Commonly known as: VASCEPA ?Take 1 g by mouth 2 (two) times daily. ?  ?ipratropium 0.03 % nasal spray ?Commonly known as: ATROVENT ?Place 2 sprays into both nostrils 2 (two) times daily as needed for rhinitis. ?  ?isosorbide dinitrate 20 MG tablet ?Commonly known as: ISORDIL ?Take 20 mg by mouth 2 (two) times daily. ?  ?LORazepam 1 MG tablet ?Commonly known as: ATIVAN ?TAKE ONE (1) TABLET BY MOUTH TWO (2) TIMES DAILY AS NEEDED ?What changed:  ?how much to take ?how to take this ?when to take this ?additional instructions ?  ?losartan 25 MG tablet ?Commonly known as: COZAAR ?Take 1 tablet (25 mg total) by  mouth daily. ?  ?metFORMIN 500 MG tablet ?Commonly known as: GLUCOPHAGE ?Take 1 tablet (500 mg total) by mouth 2 (two) times daily with a meal. ?  ?mirtazapine 15 MG tablet ?Commonly known as: REMERON ?Take 15 mg by mouth at bedtime. ?  ?nitroGLYCERIN 0.4 MG SL tablet ?Commonly known as: NITROSTAT ?Place 0.4 mg under the tongue every 5 (five) minutes x 3 doses as needed for chest pain. ?  ?oxyCODONE-acetaminophen 5-325 MG tablet ?Commonly known as: PERCOCET/ROXICET ?Take 1 tablet by mouth every 6 (six) hours as needed for moderate pain. ?  ?potassium chloride SA 20 MEQ tablet ?Commonly known as: KLOR-CON M ?Take 20 mEq by mouth every other day. In the morning ?  ?rosuvastatin 40 MG tablet ?Commonly known as: CRESTOR ?Take 40 mg by mouth every evening. ?  ?vitamin C 500 MG tablet ?  Commonly known as: ASCORBIC ACID ?Take 500 mg by mouth every morning. ?  ?Vitamin D3 50 MCG (2000 UT) Tabs ?Take 50 mcg by mouth every morning. ?  ?ZINC PO ?Take 1 tablet by mouth every morning. For men ?  ? ?  ? ?  ?  ? ? ?  ?Discharge Care Instructions  ?(From admission, onward)  ?  ? ? ?  ? ?  Start     Ordered  ? 02/06/22 0000  Discharge wound care:       ?Comments: Empty drain twice daily and as needed. Record the date, time, and volume emptied. Follow up with Dr. Trula Slade Monday for drain removal.  ? 02/06/22 1128  ? ?  ?  ? ?  ? ? Follow-up Information   ? ? [facility] Follow up in 2 week(s).   ?Why: The office will call the patient with an appointment ?Contact information: ?[address] ?[place] ?[phone] ? ?  ?  ? ?  ?  ? ?  ? ? ?Signed: ?Cherre Robins ?02/06/2022, 11:34 AM ? ? ?

## 2022-02-06 NOTE — Plan of Care (Signed)
  Problem: Education: Goal: Knowledge of disease or condition will improve Outcome: Progressing   

## 2022-02-06 NOTE — Discharge Instructions (Signed)
   [facility]  Discharge Instructions   Carotid Surgery  Please refer to the following instructions for your post-procedure care. Your surgeon or physician assistant will discuss any changes with you.  Activity  You are encouraged to walk as much as you can. You can slowly return to normal activities but must avoid strenuous activity and heavy lifting until your doctor tell you it's okay. Avoid activities such as vacuuming or swinging a golf club. You can drive after one week if you are comfortable and you are no longer taking prescription pain medications. It is normal to feel tired for serval weeks after your surgery. It is also normal to have difficulty with sleep habits, eating, and bowel movements after surgery. These will go away with time.  Bathing/Showering  Shower daily after you go home. Do not soak in a bathtub, hot tub, or swim until the incision heals completely.  Incision Care  Shower every day. Clean your incision with mild soap and water. Pat the area dry with a clean towel. You do not need a bandage unless otherwise instructed. Do not apply any ointments or creams to your incision. You may have skin glue on your incision. Do not peel it off. It will come off on its own in about one week. Your incision may feel thickened and raised for several weeks after your surgery. This is normal and the skin will soften over time.   For Men Only: It's okay to shave around the incision but do not shave the incision itself for 2 weeks. It is common to have numbness under your chin that could last for several months.  Diet  Resume your normal diet. There are no special food restrictions following this procedure. A low fat/low cholesterol diet is recommended for all patients with vascular disease. In order to heal from your surgery, it is CRITICAL to get adequate nutrition. Your body requires vitamins, minerals, and protein. Vegetables are the [place] source of  vitamins and minerals. Vegetables also provide the perfect balance of protein. Processed food has little nutritional value, so try to avoid this.  Medications  Resume taking all of your medications unless your doctor or physician assistant tells you not to. If your incision is causing pain, you may take over-the- counter pain relievers such as acetaminophen (Tylenol). If you were prescribed a stronger pain medication, please be aware these medications can cause nausea and constipation. Prevent nausea by taking the medication with a snack or meal. Avoid constipation by drinking plenty of fluids and eating foods with a high amount of fiber, such as fruits, vegetables, and grains.   Do not take Tylenol if you are taking prescription pain medications.  Follow Up  Our office will schedule a follow up appointment 2-3 weeks following discharge.  Please call us immediately for any of the following conditions  . Increased pain, redness, drainage (pus) from your incision site. . Fever of 101 degrees or higher. . If you should develop stroke (slurred speech, difficulty swallowing, weakness on one side of your body, loss of vision) you should call 911 and go to the nearest emergency room. .  Reduce your risk of vascular disease:  . Stop smoking. If you would like help call [facility] at 1-800-QUIT-NOW (1-800-784-8669) or [facility] at 336-586-4000. . Manage your cholesterol . Maintain a desired weight . Control your diabetes . Keep your blood pressure down .  If you have any questions, please call the office at [phone]. 

## 2022-02-06 NOTE — Progress Notes (Addendum)
?  Progress Note ? ? ? ?02/06/2022 ?8:24 AM ?1 Day Post-Op ? ?Subjective:  sitting up eating breakfast. Says he feels great. Minimal pain. Has ambulated in hall. Tolerating diet. Hopeful to go home soon ? ? ?Vitals:  ? 02/05/22 2316 02/06/22 0424  ?BP: 103/61 138/66  ?Pulse: 81 70  ?Resp: 18 12  ?Temp: 98.2 ?F (36.8 ?C) 97.8 ?F (36.6 ?C)  ?SpO2: 96% 96%  ? ?Physical Exam: ?Cardiac:  regular ?Lungs:  non labored ?Incisions:  left neck incision is clean, dry and intact without swelling or hematoma. Drain with 50 cc serosanguinous output ?Extremities:  moving all extremities without deficits.2+ left radial pulse. Left hand warm and well perfused ?Neurologic: neurologically intact. Speech coherent. Tongue midline. Smile symmetric ? ?CBC ?   ?Component Value Date/Time  ? WBC 10.9 (H) 02/06/2022 0248  ? RBC 4.32 02/06/2022 0248  ? HGB 13.5 02/06/2022 0248  ? HCT 38.4 (L) 02/06/2022 0248  ? PLT 200 02/06/2022 0248  ? MCV 88.9 02/06/2022 0248  ? MCH 31.3 02/06/2022 0248  ? MCHC 35.2 02/06/2022 0248  ? RDW 13.5 02/06/2022 0248  ? LYMPHSABS 1.7 01/04/2018 0455  ? MONOABS 0.7 01/04/2018 0455  ? EOSABS 0.0 01/04/2018 0455  ? BASOSABS 0.0 01/04/2018 0455  ? ? ?BMET ?   ?Component Value Date/Time  ? NA 136 02/06/2022 0248  ? K 3.9 02/06/2022 0248  ? CL 105 02/06/2022 0248  ? CO2 22 02/06/2022 0248  ? GLUCOSE 158 (H) 02/06/2022 0248  ? BUN 21 02/06/2022 0248  ? CREATININE 1.52 (H) 02/06/2022 0248  ? CALCIUM 8.6 (L) 02/06/2022 0248  ? GFRNONAA 47 (L) 02/06/2022 0248  ? GFRAA 56 (L) 06/24/2020 0436  ? ? ?INR ?   ?Component Value Date/Time  ? INR 1.1 02/03/2022 1330  ? ? ? ?Intake/Output Summary (Last 24 hours) at 02/06/2022 0824 ?Last data filed at 02/06/2022 0436 ?Gross per 24 hour  ?Intake 3028.22 ml  ?Output 1805 ml  ?Net 1223.22 ml  ? ? ? ?Assessment/Plan:  76 y.o. adult is s/p  #1: Left carotid subclavian bypass ?                      #2: Exposure of left vertebral artery 1 Day Post-Op  ? ?Left supraclavicular incision c/d/I  without swelling or hematoma ?Drain still with fair amount of output > 50 cc ?Neurologically intact ?LUE well perfused with 2+ left radial pulse ?Pain well controlled ?Possible D/c this afternoon vs tomorrow morning pending d/c of drain ? ?DVT prophylaxis: sq heparin ? ? ?Karoline Caldwell, PA-C ?[facility] ?[phone] ?02/06/2022 ?8:24 AM ? ?VASCULAR STAFF ADDENDUM: ?I have independently interviewed and examined the patient. ?I agree with the above.  ?Looks great POD1 from L CCA - SCA bypass for VBI ?2+ L radial pulse. Incision clean. ?Wants to go home. JP output significant (>11m) but reassuring serosanguinous. ?I think it is safe for him to go home and follow up with [facility] Monday with Dr. BTrula Slade or Tuesday with me for JP removal.  ? ?TYevonne Aline HStanford Breed MD ?[facility]?Office Phone Number: ([phone]?02/06/2022 11:30 AM ? ? ?

## 2022-02-08 ENCOUNTER — Other Ambulatory Visit: Payer: Self-pay

## 2022-02-08 ENCOUNTER — Encounter: Payer: Self-pay | Admitting: Surgery

## 2022-02-08 ENCOUNTER — Ambulatory Visit (INDEPENDENT_AMBULATORY_CARE_PROVIDER_SITE_OTHER): Payer: No Typology Code available for payment source | Admitting: Surgery

## 2022-02-08 VITALS — BP 129/61 | HR 65 | Temp 98.3°F | Resp 20 | Ht 72.0 in | Wt 165.0 lb

## 2022-02-08 DIAGNOSIS — G450 Vertebro-basilar artery syndrome: Secondary | ICD-10-CM

## 2022-02-08 NOTE — Progress Notes (Signed)
? ?Patient name: Jesse Hansen MRN: [id number] DOB: [birth year] Sex: adult ? ?REASON FOR VISIT:  ? ? ? Post op ? ?HISTORY OF PRESENT ILLNESS:  ? ?Jesse Hansen is a 76 y.o. who is status post left carotid subclavian bypass on 02/05/2022 for vertebrobasilar insufficiency.  He was discharged home on postoperative day 1 with a JP drain in place.  It is put out approximately 30 cc over the past 2 days.  He is here for drain removal ? ?CURRENT MEDICATIONS:  ? ? ?Current Outpatient Medications  ?Medication Sig Dispense Refill  ? acetaminophen (TYLENOL) 500 MG tablet Take 500-1,000 mg by mouth every 6 (six) hours as needed (for pain.).    ? albuterol (VENTOLIN HFA) 108 (90 Base) MCG/ACT inhaler Inhale 2 puffs into the lungs 2 (two) times daily.    ? ammonium lactate (LAC-HYDRIN) 12 % lotion Apply 1 application. topically as needed for dry skin.    ? aspirin EC 81 MG tablet Take 1 tablet (81 mg total) by mouth daily. (Patient taking differently: Take 81 mg by mouth every evening.) 90 tablet 11  ? azelastine (ASTELIN) 0.1 % nasal spray Place 2 sprays into both nostrils 2 (two) times daily as needed (runny nose).    ? BLACK WALNUT POLLEN [place] Place 1 drop under the tongue every morning.    ? carboxymethylcellulose (REFRESH PLUS) 0.5 % SOLN Place 1 drop into both eyes 3 (three) times daily as needed (dry/irritated eyes).    ? carvedilol (COREG) 6.25 MG tablet Take 6.25 mg by mouth 2 (two) times daily with a meal.    ? Cholecalciferol (VITAMIN D3) 50 MCG (2000 UT) TABS Take 50 mcg by mouth every morning.    ? clopidogrel (PLAVIX) 75 MG tablet Take 75 mg by mouth daily.    ? Coenzyme Q10 (COQ10) 50 MG CAPS Take 50 mg by mouth every morning.    ? ELDERBERRY PO Take 1 capsule by mouth daily.    ? empagliflozin (JARDIANCE) 25 MG TABS tablet Take 12.5 mg by mouth in the morning.    ? ferrous sulfate 325 (65 FE) MG tablet Take 325 mg by mouth every morning.    ? furosemide (LASIX) 40 MG tablet Take 1  tablet (40 mg total) by mouth daily. (Patient taking differently: Take 40 mg by mouth every other day.) 60 tablet 2  ? glipiZIDE (GLUCOTROL) 5 MG tablet Take 5 mg by mouth 2 (two) times daily before a meal. Take 30 minutes prior to meals.    ? icosapent Ethyl (VASCEPA) 1 g capsule Take 1 g by mouth 2 (two) times daily.    ? ipratropium (ATROVENT) 0.03 % nasal spray Place 2 sprays into both nostrils 2 (two) times daily as needed for rhinitis.    ? isosorbide dinitrate (ISORDIL) 20 MG tablet Take 20 mg by mouth 2 (two) times daily.    ? LORazepam (ATIVAN) 1 MG tablet TAKE ONE (1) TABLET BY MOUTH TWO (2) TIMES DAILY AS NEEDED (Patient taking differently: Take 1 mg by mouth at bedtime.) 60 tablet 2  ? losartan (COZAAR) 25 MG tablet Take 1 tablet (25 mg total) by mouth daily. 30 tablet 4  ? metFORMIN (GLUCOPHAGE) 500 MG tablet Take 1 tablet (500 mg total) by mouth 2 (two) times daily with a meal.    ? mirtazapine (REMERON) 15 MG tablet Take 15 mg by mouth at bedtime.    ? Multiple Vitamins-Minerals (ZINC PO) Take 1 tablet by mouth every morning. For men    ?  nitroGLYCERIN (NITROSTAT) 0.4 MG SL tablet Place 0.4 mg under the tongue every 5 (five) minutes x 3 doses as needed for chest pain.    ? oxyCODONE-acetaminophen (PERCOCET/ROXICET) 5-325 MG tablet Take 1 tablet by mouth every 6 (six) hours as needed for moderate pain. 20 tablet 0  ? potassium chloride SA (KLOR-CON) 20 MEQ tablet Take 20 mEq by mouth every other day. In the morning    ? rosuvastatin (CRESTOR) 40 MG tablet Take 40 mg by mouth every evening.    ? vitamin C (ASCORBIC ACID) 500 MG tablet Take 500 mg by mouth every morning.    ? ?No current facility-administered medications for this visit.  ? ? ?REVIEW OF SYSTEMS:  ? ?'[X]'$  denotes positive finding, '[ ]'$  denotes negative finding ?Cardiac  Comments:  ?Chest pain or chest pressure:    ?Shortness of breath upon exertion:    ?Short of breath when lying flat:    ?Irregular heart rhythm:    ?Constitutional    ?Fever  or chills:    ? ? ?PHYSICAL EXAM:  ? ?Vitals:  ? 02/08/22 1523  ?BP: 129/61  ?Pulse: 65  ?Resp: 20  ?Temp: 98.3 ?F (36.8 ?C)  ?SpO2: 96%  ?Weight: 165 lb (74.8 kg)  ?Height: 6' (1.829 m)  ? ? ?GENERAL: The patient is a well-nourished adult, in no acute distress. The vital signs are documented above. ?CARDIOVASCULAR: There is a regular rate and rhythm. ?PULMONARY: Non-labored respirations ?JP drain removed completely intact.  Drainage in the bulb was thin. ?Palpable radial pulse ?STUDIES:  ? ? ? ? ?MEDICAL ISSUES:  ? ?Patient will keep his regular scheduled follow-up appointment.  He knows to contact me should he develop swelling at his incision site. ? ?Annamarie Major, IV, MD, FACS ?[facility] ?Tel [phone] ?Pager [phone] ? ? ?  ?

## 2022-02-22 ENCOUNTER — Ambulatory Visit (INDEPENDENT_AMBULATORY_CARE_PROVIDER_SITE_OTHER): Payer: No Typology Code available for payment source | Admitting: Physician Assistant

## 2022-02-22 VITALS — BP 123/62 | HR 66 | Temp 98.0°F | Resp 20 | Ht 72.0 in | Wt 161.2 lb

## 2022-02-22 DIAGNOSIS — I708 Atherosclerosis of other arteries: Secondary | ICD-10-CM

## 2022-02-22 NOTE — Progress Notes (Signed)
?POST OPERATIVE OFFICE NOTE ? ? ? ?CC:  F/u for surgery ? ?HPI:  This is a 76 y.o. 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  He is s/p Left carotid subclavian bypass on 02/05/22 by Dr. Trula Slade.  His postoperative course was complicated by dysphagia and hoarseness which eventually resolved.  He is here for incisional check.  He also had a CC of right UE pain with ecchymosis. He states he was stuck "11" times to start an IV day of surgery and this caused him right UE discomfort until yesterday.   ?  He has a known right carotid artery occlusion.  He is also undergone aortic reconstruction by Dr. Donnetta Hutching. ? He denies difficulty swallowing, pain at the neck incision or symptoms of stroke to include amaurosis, aphasia, or weakness in extremities.  He is very happy with his surgical recovery.   ? ? ? ?Allergies  ?Allergen Reactions  ? Codeine Nausea And Vomiting and Itching  ?  Other reaction(s): Paresthesia, Finding of vomiting  ? Nicotine   ?  Nicotine Patch--Per Nurse "heart issues"  ? Niacin Other (See Comments)  ?  unknown  ? ? ?Current Outpatient Medications  ?Medication Sig Dispense Refill  ? acetaminophen (TYLENOL) 500 MG tablet Take 500-1,000 mg by mouth every 6 (six) hours as needed (for pain.).    ? albuterol (VENTOLIN HFA) 108 (90 Base) MCG/ACT inhaler Inhale 2 puffs into the lungs 2 (two) times daily.    ? ammonium lactate (LAC-HYDRIN) 12 % lotion Apply 1 application. topically as needed for dry skin.    ? aspirin EC 81 MG tablet Take 1 tablet (81 mg total) by mouth daily. (Patient taking differently: Take 81 mg by mouth every evening.) 90 tablet 11  ? azelastine (ASTELIN) 0.1 % nasal spray Place 2 sprays into both nostrils 2 (two) times daily as needed (runny nose).    ? BLACK WALNUT POLLEN [place] Place 1 drop under the tongue every morning.    ? carboxymethylcellulose (REFRESH PLUS) 0.5 % SOLN Place 1 drop into both eyes 3 (three) times daily as needed (dry/irritated eyes).     ? carvedilol (COREG) 6.25 MG tablet Take 6.25 mg by mouth 2 (two) times daily with a meal.    ? Cholecalciferol (VITAMIN D3) 50 MCG (2000 UT) TABS Take 50 mcg by mouth every morning.    ? clopidogrel (PLAVIX) 75 MG tablet Take 75 mg by mouth daily.    ? Coenzyme Q10 (COQ10) 50 MG CAPS Take 50 mg by mouth every morning.    ? ELDERBERRY PO Take 1 capsule by mouth daily.    ? empagliflozin (JARDIANCE) 25 MG TABS tablet Take 12.5 mg by mouth in the morning.    ? ferrous sulfate 325 (65 FE) MG tablet Take 325 mg by mouth every morning.    ? furosemide (LASIX) 40 MG tablet Take 1 tablet (40 mg total) by mouth daily. (Patient taking differently: Take 40 mg by mouth every other day.) 60 tablet 2  ? glipiZIDE (GLUCOTROL) 5 MG tablet Take 5 mg by mouth 2 (two) times daily before a meal. Take 30 minutes prior to meals.    ? icosapent Ethyl (VASCEPA) 1 g capsule Take 1 g by mouth 2 (two) times daily.    ? ipratropium (ATROVENT) 0.03 % nasal spray Place 2 sprays into both nostrils 2 (two) times daily as needed for rhinitis.    ? isosorbide dinitrate (ISORDIL) 20 MG tablet Take 20 mg by mouth  2 (two) times daily.    ? LORazepam (ATIVAN) 1 MG tablet TAKE ONE (1) TABLET BY MOUTH TWO (2) TIMES DAILY AS NEEDED (Patient taking differently: Take 1 mg by mouth at bedtime.) 60 tablet 2  ? losartan (COZAAR) 25 MG tablet Take 1 tablet (25 mg total) by mouth daily. 30 tablet 4  ? metFORMIN (GLUCOPHAGE) 500 MG tablet Take 1 tablet (500 mg total) by mouth 2 (two) times daily with a meal.    ? mirtazapine (REMERON) 15 MG tablet Take 15 mg by mouth at bedtime.    ? Multiple Vitamins-Minerals (ZINC PO) Take 1 tablet by mouth every morning. For men    ? nitroGLYCERIN (NITROSTAT) 0.4 MG SL tablet Place 0.4 mg under the tongue every 5 (five) minutes x 3 doses as needed for chest pain.    ? oxyCODONE-acetaminophen (PERCOCET/ROXICET) 5-325 MG tablet Take 1 tablet by mouth every 6 (six) hours as needed for moderate pain. 20 tablet 0  ? potassium  chloride SA (KLOR-CON) 20 MEQ tablet Take 20 mEq by mouth every other day. In the morning    ? rosuvastatin (CRESTOR) 40 MG tablet Take 40 mg by mouth every evening.    ? vitamin C (ASCORBIC ACID) 500 MG tablet Take 500 mg by mouth every morning.    ? ?No current facility-administered medications for this visit.  ? ? ? ROS:  See HPI ? ?Physical Exam: ? ? ? ?Incision:  well healed without hematoma, no erythema or edema ?Extremities:  moving all 4 ext, palpable radial pulses B, right UE with resolving ecchymosis, no edema. ?Neuro: sensation B UE intact and equal, no tongue deviation or facial droop noted.  ?Lungs non labored breathing ? ? ?Assessment/Plan:  This is a 75 y.o. adult who is s/p: Left carotid subclavian bypass ?He has healed well.  The incision is well healed and he is asymptomatic for stroke/TIA.  He has a known right ICA occlusion.  ? ?His right UE symptoms have resolved from multiple sticks for IV access as well.  He will f/u in 2-3 weeks for carotid duplex to check flow and get a new baseline.  He is on dual antiplatelets and Stain daily.   ? ? ?Jesse Hansen ?PA-C ?[facility] ?[phone] ? ? ?Clinic MD:  Trula Slade ?

## 2022-03-03 ENCOUNTER — Other Ambulatory Visit: Payer: Self-pay | Admitting: *Deleted

## 2022-03-03 DIAGNOSIS — I6523 Occlusion and stenosis of bilateral carotid arteries: Secondary | ICD-10-CM

## 2022-03-17 ENCOUNTER — Other Ambulatory Visit: Payer: Self-pay | Admitting: Internal Medicine

## 2022-03-26 NOTE — Progress Notes (Signed)
?POST OPERATIVE OFFICE NOTE ? ? ? ?CC:  F/u for surgery ? ?HPI:  This is a 76 y.o. adult who is s/p #1: Left carotid subclavian bypass #2: Exposure of left vertebral artery on 02/05/22 by Dr. Trula Slade. This was performed secondary to patent having dizzy spells. His post operative course was complicated by dysphagia and hoarseness. These were resolved at the time of his last visit 1 month ago. Today he reports no dizziness, headaches, visual changes, slurred speech, facial drooping, upper or lower extremity weakness or numbness. He has not left upper arm weakness, numbness, pain, coldness. Says he has started back to lifting 5 lb weights and hopes to start back to walking. Says he recently just started feeling well enough to increase his activity. He denies any lower extremity claudication symptoms, rest pain or non healing wounds. ? ?He has known right ICA occlusion. He previously had Left CEA in 2000 at the [facility]. He had recurrent stenosis and subsequently underwent left carotid artery stenting by Dr. Trula Slade in 2018. He has very remote history of AAA repair by Dr. Donnetta Hutching over 20 years ago.  ? ?Allergies  ?Allergen Reactions  ? Codeine Nausea And Vomiting and Itching  ?  Other reaction(s): Paresthesia, Finding of vomiting  ? Nicotine   ?  Nicotine Patch--Per Nurse "heart issues"  ? Niacin Other (See Comments)  ?  unknown  ? ? ?Current Outpatient Medications  ?Medication Sig Dispense Refill  ? acetaminophen (TYLENOL) 500 MG tablet Take 500-1,000 mg by mouth every 6 (six) hours as needed (for pain.).    ? albuterol (VENTOLIN HFA) 108 (90 Base) MCG/ACT inhaler Inhale 2 puffs into the lungs 2 (two) times daily.    ? ammonium lactate (LAC-HYDRIN) 12 % lotion Apply 1 application. topically as needed for dry skin.    ? aspirin EC 81 MG tablet Take 1 tablet (81 mg total) by mouth daily. (Patient taking differently: Take 81 mg by mouth every evening.) 90 tablet 11  ? azelastine (ASTELIN) 0.1 % nasal spray  Place 2 sprays into both nostrils 2 (two) times daily as needed (runny nose).    ? BLACK WALNUT POLLEN [place] Place 1 drop under the tongue every morning.    ? carboxymethylcellulose (REFRESH PLUS) 0.5 % SOLN Place 1 drop into both eyes 3 (three) times daily as needed (dry/irritated eyes).    ? carvedilol (COREG) 6.25 MG tablet Take 6.25 mg by mouth 2 (two) times daily with a meal.    ? Cholecalciferol (VITAMIN D3) 50 MCG (2000 UT) TABS Take 50 mcg by mouth every morning.    ? clopidogrel (PLAVIX) 75 MG tablet Take 75 mg by mouth daily.    ? Coenzyme Q10 (COQ10) 50 MG CAPS Take 50 mg by mouth every morning.    ? ELDERBERRY PO Take 1 capsule by mouth daily.    ? empagliflozin (JARDIANCE) 25 MG TABS tablet Take 12.5 mg by mouth in the morning.    ? ferrous sulfate 325 (65 FE) MG tablet Take 325 mg by mouth every morning.    ? furosemide (LASIX) 40 MG tablet Take 1 tablet (40 mg total) by mouth daily. (Patient taking differently: Take 40 mg by mouth every other day.) 60 tablet 2  ? glipiZIDE (GLUCOTROL) 5 MG tablet Take 5 mg by mouth 2 (two) times daily before a meal. Take 30 minutes prior to meals.    ? icosapent Ethyl (VASCEPA) 1 g capsule Take 1 g by mouth 2 (two) times daily.    ?  ipratropium (ATROVENT) 0.03 % nasal spray Place 2 sprays into both nostrils 2 (two) times daily as needed for rhinitis.    ? isosorbide dinitrate (ISORDIL) 20 MG tablet Take 20 mg by mouth 2 (two) times daily.    ? LORazepam (ATIVAN) 1 MG tablet TAKE ONE (1) TABLET BY MOUTH TWO (2) TIMES DAILY AS NEEDED 60 tablet 2  ? losartan (COZAAR) 25 MG tablet Take 1 tablet (25 mg total) by mouth daily. 30 tablet 4  ? metFORMIN (GLUCOPHAGE) 500 MG tablet Take 1 tablet (500 mg total) by mouth 2 (two) times daily with a meal.    ? mirtazapine (REMERON) 15 MG tablet Take 15 mg by mouth at bedtime.    ? Multiple Vitamins-Minerals (ZINC PO) Take 1 tablet by mouth every morning. For men    ? nitroGLYCERIN (NITROSTAT) 0.4 MG SL tablet Place 0.4 mg under the  tongue every 5 (five) minutes x 3 doses as needed for chest pain.    ? oxyCODONE-acetaminophen (PERCOCET/ROXICET) 5-325 MG tablet Take 1 tablet by mouth every 6 (six) hours as needed for moderate pain. 20 tablet 0  ? potassium chloride SA (KLOR-CON) 20 MEQ tablet Take 20 mEq by mouth every other day. In the morning    ? rosuvastatin (CRESTOR) 40 MG tablet Take 40 mg by mouth every evening.    ? vitamin C (ASCORBIC ACID) 500 MG tablet Take 500 mg by mouth every morning.    ? ?No current facility-administered medications for this visit.  ? ? ? ROS:  See HP ? ?Physical Exam: ? ?Vitals:  ? 03/29/22 1223 03/29/22 1224  ?BP: (!) 106/53 112/61  ?Pulse: (!) 58   ?Resp: 20   ?Temp: 97.8 ?F (36.6 ?C)   ?TempSrc: Temporal   ?SpO2: 98%   ?Weight: 159 lb 12.8 oz (72.5 kg)   ?Height: 6' (1.829 m)   ? ? ?General: well appearing, in no distress ?Chest: regular rate and rhythm ?Lungs: non labored ?Incision:  left neck incision healed very well ?Extremities:  extremities well perfused and warm. 2+ radial and brachial pulses bilaterally. Hands warm and well perfused. 5/5 grip strength. 2+ dp pulses bilaterally. Feet warm and well perfused ?Neuro: alert and oriented. CN intact ?Abdomen:  soft, non tender ? ?Non invasive vascular lab: ?Summary:  ?Right Carotid: Evidence consistent with a total occlusion of the right ICA.  ? ?Left Carotid: Velocities in the left ICA are consistent with a 1-39% stenosis. Patent stent.  ? ?Vertebrals:  Right vertebral artery demonstrates antegrade flow. Left vertebral artery demonstrates retrograde flow.  ?Subclavians: Left subclavian artery was stenotic. Normal flow hemodynamics were seen in the right subclavian artery.  ? ? ?Assessment/Plan:  This is a 76 y.o. adult who is s/p: #1: Left carotid subclavian bypass #2: Exposure of left vertebral artery on 02/05/22 by Dr. Trula Slade. He has healed very nicely. He has no new neurological symptoms. Left upper extremity well perfused and warm with palpable left  radial and brachial pulses.  ?- Duplex today shows known occlusion of right ICA.left ICA with 1-39% stenosis and patent stent. Left Subclavian appears patent. There is elevated velocities noted on duplex that is likely due to tortuosity vs more proximal calcification. Left upper extremity is well perfused on clinical exam. Will repeat study in 6 months to re evaluate this ?- continue Aspirin, Statin, Plavix ?- Encourage walking regimen ?- He will follow up in 9 months with Carotid Duplex ? ?Karoline Caldwell, PA-C ?[facility] ?[phone] ? ?Clinic MD:  [place]  ?

## 2022-03-29 ENCOUNTER — Ambulatory Visit (INDEPENDENT_AMBULATORY_CARE_PROVIDER_SITE_OTHER): Payer: No Typology Code available for payment source | Admitting: Physician Assistant

## 2022-03-29 ENCOUNTER — Ambulatory Visit (HOSPITAL_COMMUNITY)
Admission: RE | Admit: 2022-03-29 | Discharge: 2022-03-29 | Disposition: A | Discharge status: Home / Self Care | Payer: No Typology Code available for payment source | Source: Ambulatory Visit | Attending: Surgery | Admitting: Surgery

## 2022-03-29 ENCOUNTER — Encounter: Payer: Self-pay | Admitting: Physician Assistant

## 2022-03-29 VITALS — BP 112/61 | HR 58 | Temp 97.8°F | Resp 20 | Ht 72.0 in | Wt 159.8 lb

## 2022-03-29 DIAGNOSIS — I739 Peripheral vascular disease, unspecified: Secondary | ICD-10-CM

## 2022-03-29 DIAGNOSIS — I6521 Occlusion and stenosis of right carotid artery: Secondary | ICD-10-CM

## 2022-03-29 DIAGNOSIS — Z95828 Presence of other vascular implants and grafts: Secondary | ICD-10-CM

## 2022-03-29 DIAGNOSIS — Z9889 Other specified postprocedural states: Secondary | ICD-10-CM

## 2022-03-29 DIAGNOSIS — I6523 Occlusion and stenosis of bilateral carotid arteries: Secondary | ICD-10-CM | POA: Insufficient documentation

## 2022-03-29 DIAGNOSIS — I708 Atherosclerosis of other arteries: Secondary | ICD-10-CM

## 2022-04-02 ENCOUNTER — Other Ambulatory Visit: Payer: Self-pay | Admitting: *Deleted

## 2022-04-02 DIAGNOSIS — Z95828 Presence of other vascular implants and grafts: Secondary | ICD-10-CM

## 2022-04-02 DIAGNOSIS — I6523 Occlusion and stenosis of bilateral carotid arteries: Secondary | ICD-10-CM

## 2022-04-28 ENCOUNTER — Ambulatory Visit: Payer: No Typology Code available for payment source | Admitting: Internal Medicine

## 2022-06-22 ENCOUNTER — Other Ambulatory Visit: Payer: Self-pay | Admitting: Internal Medicine

## 2022-07-23 ENCOUNTER — Other Ambulatory Visit: Payer: Self-pay | Admitting: Internal Medicine

## 2022-08-04 ENCOUNTER — Encounter: Payer: Self-pay | Admitting: Internal Medicine

## 2022-08-04 ENCOUNTER — Ambulatory Visit (INDEPENDENT_AMBULATORY_CARE_PROVIDER_SITE_OTHER): Payer: No Typology Code available for payment source | Admitting: Internal Medicine

## 2022-08-04 VITALS — BP 118/72 | HR 64 | Temp 97.7°F | Ht 71.0 in | Wt 169.0 lb

## 2022-08-04 DIAGNOSIS — Z0001 Encounter for general adult medical examination with abnormal findings: Secondary | ICD-10-CM

## 2022-08-04 DIAGNOSIS — Z0000 Encounter for general adult medical examination without abnormal findings: Secondary | ICD-10-CM | POA: Diagnosis not present

## 2022-08-04 MED ORDER — LORAZEPAM 1 MG PO TABS: ORAL_TABLET | ORAL | 5 refills | Status: DC

## 2022-08-04 NOTE — Patient Instructions (Signed)
Please continue all other medications as before, and refills have been done if requested - the lorazepam  Please have the pharmacy call with any other refills you may need.  Please continue your efforts at being more active, low cholesterol diet, and weight control.  You are otherwise up to date with prevention measures today.  Please keep your appointments with your specialists as you may have planned at the [facility]  Please make an Appointment to return for your 1 year visit, or sooner if needed

## 2022-08-04 NOTE — Progress Notes (Signed)
Patient ID: Jesse Hansen, adult   DOB: 1946/05/14, 76 y.o.   MRN: [id number]         Chief Complaint:: wellness exam and anxiety       HPI:  Jesse Hansen is a 76 y.o. adult here for wellness exam.  Due for eye exam soon.  Does not want lab work today.                          Also s/p cervical spine surgury, left ear lesion removed, and PPM placed after syncope and low HR since last seen.  Pt denies chest pain, increased sob or doe, wheezing, orthopnea, PND, increased LE swelling, palpitations, dizziness or syncope.   Pt denies polydipsia, polyuria, or new focal neuro s/s.    Pt denies fever, wt loss, night sweats, loss of appetite, or other constitutional symptoms  Denies worsening depressive symptoms, suicidal ideation, or panic; has ongoing anxiety, not increased recently.  Has DM care at [facility] only per pt   Wt Readings from Last 3 Encounters:  08/04/22 169 lb (76.7 kg)  03/29/22 159 lb 12.8 oz (72.5 kg)  02/22/22 161 lb 3.2 oz (73.1 kg)   BP Readings from Last 3 Encounters:  08/04/22 118/72  03/29/22 112/61  02/22/22 123/62   Immunization History  Administered Date(s) Administered   Influenza Whole 07/27/2010   Influenza, High Dose Seasonal PF 08/28/2010, 11/04/2016, 10/12/2019   Influenza, Seasonal, Injecte, Preservative Fre 12/15/2015   Influenza-Unspecified 02/06/2002, 08/23/2003, 11/23/2003, 09/22/2004, 09/22/2005, 11/28/2009, 10/08/2011, 10/03/2014, 01/31/2015, 10/19/2017, 09/22/2018   Moderna Sars-Covid-2 Vaccination 12/19/2019, 01/16/2020, 10/31/2020   Pneumococcal Conjugate-13 09/13/2014, 04/04/2017   Pneumococcal Polysaccharide-23 11/22/2004, 05/04/2013   Pneumococcal-Unspecified 08/10/2002   Td 08/10/2002, 11/23/2003   Tdap 01/23/2012   Zoster Recombinat (Shingrix) 04/10/2020, 06/12/2020, 08/20/2020   There are no preventive care reminders to display for this patient.     Past Medical History:  Diagnosis Date   Agent orange exposure    Anxiety    Carotid artery  disease ([place])    a. s/p carotid endarterectomy remotely.   Chronic systolic CHF (congestive heart failure) ([place])    COLONIC POLYPS, HX OF 01/29/2011   Qualifier: Diagnosis of  By: Jenny Reichmann MD, Hunt Oris    Coronary artery disease involving native coronary artery with angina pectoris [facility])    a. s/p Multiple PCIs. b. CABG x 3 in 2013 (SVG-OM1, SVG-Diag with freeLIMA-LAD from SVG-Diag hood) --> relook cath in ~2015 with at least 1 graft ~occluded by report.   Diabetes mellitus with circulatory complication [facility])    ERECTILE DYSFUNCTION 08/15/2007   Qualifier: Diagnosis of  By: Dance CMA (AAMA), Kim     Essential hypertension    Hyperlipidemia    Insomnia    LBBB (left bundle branch block)    Pneumonia    PTSD 07/30/2010   Qualifier: Diagnosis of  By: Jenny Reichmann MD, Hunt Oris    PVD (peripheral vascular disease) ([place])    a. s/p AOBF and LCE in the 1990s.   Past Surgical History:  Procedure Laterality Date   ABDOMINAL AORTIC ANEURYSM REPAIR  1996?   Done at [facility] by Dr. Donnetta Hutching   CAROTID ENDARTERECTOMY Left    Dr. Volney American at [facility] in [place] PTA/STENT INTERVENTION Left 03/24/2017   Procedure: Carotid PTA/Stent Intervention;  Surgeon: Serafina Mitchell, MD;  Location: [place] [facility];  Service: Cardiovascular;  Laterality: Left;   CAROTID-SUBCLAVIAN BYPASS GRAFT Left 02/05/2022  Procedure: LEFT CAROTID-SUBCLAVIAN BYPASS GRAFT;  Surgeon: Serafina Mitchell, MD;  Location: [place];  Service: Vascular;  Laterality: Left;   CORONARY ARTERY BYPASS GRAFT  2013   @ [place], [place] -- SVG-Diag with freeLIMA-LAD sewn to SVG hood, SVG-OM1   CORONARY STENT PLACEMENT  2007   LEFT HEART CATH AND CORS/GRAFTS ANGIOGRAPHY N/A 01/05/2018   Procedure: LEFT HEART CATH AND CORS/GRAFTS ANGIOGRAPHY;  Surgeon: Martinique, Peter M, MD;  Location: [place] [facility];  Service: Cardiovascular;  Laterality: N/A;   PROSTATE BIOPSY  2009    reports that he has been smoking  cigarettes. He has been smoking an average of .25 packs per day. He has never been exposed to tobacco smoke. He has never used smokeless tobacco. He reports that he does not currently use alcohol. He reports that he does not use drugs. family history includes Hypertension in his father. Allergies  Allergen Reactions   Codeine Nausea And Vomiting and Itching    Other reaction(s): Paresthesia, Finding of vomiting   Nicotine     Nicotine Patch--Per Nurse "heart issues"   Niacin Other (See Comments)    unknown   Current Outpatient Medications on File Prior to Visit  Medication Sig Dispense Refill   acetaminophen (TYLENOL) 77 MG tablet Take 500-1,000 mg by mouth every 6 (six) hours as needed (for pain.).     albuterol (VENTOLIN HFA) 108 (90 Base) MCG/ACT inhaler Inhale 2 puffs into the lungs 2 (two) times daily.     ammonium lactate (LAC-HYDRIN) 12 % lotion Apply 1 application. topically as needed for dry skin.     aspirin EC 81 MG tablet Take 1 tablet (81 mg total) by mouth daily. (Patient taking differently: Take 81 mg by mouth every evening.) 90 tablet 11   azelastine (ASTELIN) 0.1 % nasal spray Place 2 sprays into both nostrils 2 (two) times daily as needed (runny nose).     BLACK WALNUT POLLEN [place] Place 1 drop under the tongue every morning.     carboxymethylcellulose (REFRESH PLUS) 0.5 % SOLN Place 1 drop into both eyes 3 (three) times daily as needed (dry/irritated eyes).     carvedilol (COREG) 6.25 MG tablet Take 6.25 mg by mouth 2 (two) times daily with a meal.     Cholecalciferol (VITAMIN D3) 50 MCG (2000 UT) TABS Take 50 mcg by mouth every morning.     clopidogrel (PLAVIX) 75 MG tablet Take 75 mg by mouth daily.     Coenzyme Q10 (COQ10) 50 MG CAPS Take 50 mg by mouth every morning.     ELDERBERRY PO Take 1 capsule by mouth daily.     empagliflozin (JARDIANCE) 25 MG TABS tablet Take 12.5 mg by mouth in the morning.     ferrous sulfate 325 (65 FE) MG tablet Take 325 mg by mouth every  morning.     furosemide (LASIX) 40 MG tablet Take 1 tablet (40 mg total) by mouth daily. (Patient taking differently: Take 40 mg by mouth every other day.) 60 tablet 2   glipiZIDE (GLUCOTROL) 5 MG tablet Take 5 mg by mouth 2 (two) times daily before a meal. Take 30 minutes prior to meals.     icosapent Ethyl (VASCEPA) 1 g capsule Take 1 g by mouth 2 (two) times daily.     ipratropium (ATROVENT) 0.03 % nasal spray Place 2 sprays into both nostrils 2 (two) times daily as needed for rhinitis.  losartan (COZAAR) 25 MG tablet Take 1 tablet (25 mg total) by mouth daily. 30 tablet 4   metFORMIN (GLUCOPHAGE) 500 MG tablet Take 1 tablet (500 mg total) by mouth 2 (two) times daily with a meal.     mirtazapine (REMERON) 15 MG tablet Take 15 mg by mouth at bedtime.     Multiple Vitamins-Minerals (ZINC PO) Take 1 tablet by mouth every morning. For men     nitroGLYCERIN (NITROSTAT) 0.4 MG SL tablet Place 0.4 mg under the tongue every 5 (five) minutes x 3 doses as needed for chest pain.     oxyCODONE-acetaminophen (PERCOCET/ROXICET) 5-325 MG tablet Take 1 tablet by mouth every 6 (six) hours as needed for moderate pain. 20 tablet 0   potassium chloride SA (KLOR-CON) 20 MEQ tablet Take 20 mEq by mouth daily. In the morning     rosuvastatin (CRESTOR) 40 MG tablet Take 40 mg by mouth every evening.     vitamin C (ASCORBIC ACID) 500 MG tablet Take 500 mg by mouth every morning.     No current facility-administered medications on file prior to visit.        ROS:  All others reviewed and negative.  Objective        PE:  BP 118/72   Pulse 64   Temp 97.7 F (36.5 C)   Ht '5\' 11"'$  (1.803 m)   Wt 169 lb (76.7 kg)   SpO2 99%   BMI 23.57 kg/m                 Constitutional: Pt appears in NAD               HENT: Head: NCAT.                Right Ear: External ear normal.                 Left Ear: External ear normal.                Eyes: . Pupils are equal, round, and reactive to light. Conjunctivae and EOM  are normal               Nose: without d/c or deformity               Neck: Neck supple. Gross normal ROM               Cardiovascular: Normal rate and regular rhythm.                 Pulmonary/Chest: Effort normal and breath sounds without rales or wheezing.                Abd:  Soft, NT, ND, + BS, no organomegaly               Neurological: Pt is alert. At baseline orientation, motor grossly intact               Skin: Skin is warm. No rashes, no other new lesions, LE edema - none               Psychiatric: Pt behavior is normal without agitation   Micro: none  Cardiac tracings I have personally interpreted today:  none  Pertinent Radiological findings (summarize): none   Lab Results  Component Value Date   WBC 10.9 (H) 02/06/2022   HGB 13.5 02/06/2022   HCT 38.4 (L) 02/06/2022   PLT 200 02/06/2022   GLUCOSE 158 (H) 02/06/2022  CHOL 137 02/06/2022   TRIG 81 02/06/2022   HDL 29 (L) 02/06/2022   LDLCALC 92 02/06/2022   ALT 21 02/03/2022   AST 21 02/03/2022   NA 136 02/06/2022   K 3.9 02/06/2022   CL 105 02/06/2022   CREATININE 1.52 (H) 02/06/2022   BUN 21 02/06/2022   CO2 22 02/06/2022   TSH 1.139 01/04/2018   INR 1.1 02/03/2022   HGBA1C 6.8 (H) 02/03/2022   Assessment/Plan:  Jesse Hansen is a 76 y.o. White or Caucasian [1] adult with  has a past medical history of Agent orange exposure, Anxiety, Carotid artery disease ([place]), Chronic systolic CHF (congestive heart failure) ([place]), COLONIC POLYPS, HX OF (01/29/2011), Coronary artery disease involving native coronary artery with angina pectoris ([place]), Diabetes mellitus with circulatory complication ([place]), ERECTILE DYSFUNCTION (08/15/2007), Essential hypertension, Hyperlipidemia, Insomnia, LBBB (left bundle branch block), Pneumonia, PTSD (07/30/2010), and PVD (peripheral vascular disease) ([place]).  Encounter for well adult exam with abnormal findings Age and sex appropriate education and counseling updated with regular exercise  and diet Referrals for preventative services - none needed Immunizations addressed - declines flu shot Smoking counseling  - counsled to quit, pt not ready Evidence for depression or other mood disorder - chronic anxiety - stable Most recent labs reviewed. I have personally reviewed and have noted: 1) the patient's medical and social history 2) The patient's current medications and supplements 3) The patient's height, weight, and BMI have been recorded in the chart  Followup: Return in about 1 year (around 08/05/2023).  Cathlean Cower, MD 08/07/2022 8:20 PM [facility]

## 2022-08-07 ENCOUNTER — Encounter: Payer: Self-pay | Admitting: Internal Medicine

## 2022-08-07 NOTE — Assessment & Plan Note (Addendum)
Age and sex appropriate education and counseling updated with regular exercise and diet Referrals for preventative services - none needed Immunizations addressed - declines flu shot Smoking counseling  - counsled to quit, pt not ready Evidence for depression or other mood disorder - chronic anxiety - stable Most recent labs reviewed. I have personally reviewed and have noted: 1) the patient's medical and social history 2) The patient's current medications and supplements 3) The patient's height, weight, and BMI have been recorded in the chart

## 2022-09-27 ENCOUNTER — Ambulatory Visit (HOSPITAL_COMMUNITY): Payer: No Typology Code available for payment source

## 2022-09-27 ENCOUNTER — Ambulatory Visit: Payer: No Typology Code available for payment source

## 2022-10-25 ENCOUNTER — Ambulatory Visit (HOSPITAL_COMMUNITY): Payer: No Typology Code available for payment source

## 2022-10-25 ENCOUNTER — Encounter (HOSPITAL_COMMUNITY): Payer: Self-pay

## 2022-10-25 ENCOUNTER — Ambulatory Visit: Payer: No Typology Code available for payment source

## 2022-11-29 ENCOUNTER — Ambulatory Visit (HOSPITAL_COMMUNITY)
Admission: RE | Admit: 2022-11-29 | Discharge: 2022-11-29 | Disposition: A | Discharge status: Home / Self Care | Payer: No Typology Code available for payment source | Source: Ambulatory Visit | Attending: Physician Assistant | Admitting: Physician Assistant

## 2022-11-29 ENCOUNTER — Ambulatory Visit (INDEPENDENT_AMBULATORY_CARE_PROVIDER_SITE_OTHER): Payer: No Typology Code available for payment source | Admitting: Physician Assistant

## 2022-11-29 VITALS — BP 112/65 | HR 63 | Temp 98.2°F | Resp 20 | Ht 71.0 in | Wt 156.0 lb

## 2022-11-29 DIAGNOSIS — Z9889 Other specified postprocedural states: Secondary | ICD-10-CM | POA: Diagnosis present

## 2022-11-29 DIAGNOSIS — Z95828 Presence of other vascular implants and grafts: Secondary | ICD-10-CM | POA: Diagnosis present

## 2022-11-29 DIAGNOSIS — I6523 Occlusion and stenosis of bilateral carotid arteries: Secondary | ICD-10-CM

## 2022-11-29 DIAGNOSIS — G450 Vertebro-basilar artery syndrome: Secondary | ICD-10-CM | POA: Diagnosis not present

## 2022-11-29 NOTE — Progress Notes (Signed)
Established Carotid Patient   History of Present Illness   Jesse Hansen is a 77 y.o. (21-Nov-1946) adult who presents for surveillance of known carotid artery stenosis.  He has a history of left carotid endarterectomy in 2000 at the [facility]  He eventually developed recurrent stenosis and required left carotid artery stenting by Dr. Trula Slade in 2018.  He also required left carotid subclavian bypass on 02/05/2022 by Dr. Trula Slade.  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   Since we have last seen the patient, he has gotten an implanted pacemaker.  He states that this is greatly helped with his angina and no longer has to take nitro.  He also denies any dizziness since his carotid subclavian bypass.  He has been in physical therapy getting stronger.  He denies any stroke or TIA-like symptoms.  He denies any claudication, rest pain, wounds of the lower extremities.  He is still taking his Plavix and statin.  He has also been started on amiodarone by his cardiologist at [place].  He has a known occluded right ICA.  Current Outpatient Medications  Medication Sig Dispense Refill   acetaminophen (TYLENOL) 500 MG tablet Take 500-1,000 mg by mouth every 6 (six) hours as needed (for pain.).     albuterol (VENTOLIN HFA) 108 (90 Base) MCG/ACT inhaler Inhale 2 puffs into the lungs 2 (two) times daily.     ammonium lactate (LAC-HYDRIN) 12 % lotion Apply 1 application. topically as needed for dry skin.     aspirin EC 81 MG tablet Take 1 tablet (81 mg total) by mouth daily. (Patient taking differently: Take 81 mg by mouth every evening.) 90 tablet 11   azelastine (ASTELIN) 0.1 % nasal spray Place 2 sprays into both nostrils 2 (two) times daily as needed (runny nose).     BLACK WALNUT POLLEN [place] Place 1 drop under the tongue every morning.     carboxymethylcellulose (REFRESH PLUS) 0.5 % SOLN Place 1 drop into both  eyes 3 (three) times daily as needed (dry/irritated eyes).     carvedilol (COREG) 6.25 MG tablet Take 6.25 mg by mouth 2 (two) times daily with a meal.     Cholecalciferol (VITAMIN D3) 50 MCG (2000 UT) TABS Take 50 mcg by mouth every morning.     clopidogrel (PLAVIX) 75 MG tablet Take 75 mg by mouth daily.     Coenzyme Q10 (COQ10) 50 MG CAPS Take 50 mg by mouth every morning.     ELDERBERRY PO Take 1 capsule by mouth daily.     empagliflozin (JARDIANCE) 25 MG TABS tablet Take 12.5 mg by mouth in the morning.     ferrous sulfate 325 (65 FE) MG tablet Take 325 mg by mouth every morning.     furosemide (LASIX) 40 MG tablet Take 1 tablet (40 mg total) by mouth daily. (Patient taking differently: Take 40 mg by mouth every other day.) 60 tablet 2   glipiZIDE (GLUCOTROL) 5 MG tablet Take 5 mg by mouth 2 (two) times daily before a meal. Take 30 minutes prior to meals.     icosapent Ethyl (VASCEPA) 1 g capsule Take 1 g by mouth 2 (two) times daily.     ipratropium (ATROVENT) 0.03 % nasal spray Place 2 sprays into both nostrils 2 (two) times daily as needed for rhinitis.     LORazepam (ATIVAN) 1 MG tablet 1 tab by mouth twice per day as  needed 60 tablet 5   losartan (COZAAR) 25 MG tablet Take 1 tablet (25 mg total) by mouth daily. 30 tablet 4   metFORMIN (GLUCOPHAGE) 500 MG tablet Take 1 tablet (500 mg total) by mouth 2 (two) times daily with a meal.     mirtazapine (REMERON) 15 MG tablet Take 15 mg by mouth at bedtime.     Multiple Vitamins-Minerals (ZINC PO) Take 1 tablet by mouth every morning. For men     nitroGLYCERIN (NITROSTAT) 0.4 MG SL tablet Place 0.4 mg under the tongue every 5 (five) minutes x 3 doses as needed for chest pain.     oxyCODONE-acetaminophen (PERCOCET/ROXICET) 5-325 MG tablet Take 1 tablet by mouth every 6 (six) hours as needed for moderate pain. 20 tablet 0   potassium chloride SA (KLOR-CON) 20 MEQ tablet Take 20 mEq by mouth daily. In the morning     rosuvastatin (CRESTOR) 40 MG  tablet Take 40 mg by mouth every evening.     vitamin C (ASCORBIC ACID) 500 MG tablet Take 500 mg by mouth every morning.     No current facility-administered medications for this visit.    REVIEW OF SYSTEMS (negative unless checked):   Cardiac:  '[]'$  Chest pain or chest pressure? '[]'$  Shortness of breath upon activity? '[]'$  Shortness of breath when lying flat? '[]'$  Irregular heart rhythm?  Vascular:  '[]'$  Pain in calf, thigh, or hip brought on by walking? '[]'$  Pain in feet at night that wakes you up from your sleep? '[]'$  Blood clot in your veins? '[]'$  Leg swelling?  Pulmonary:  '[]'$  Oxygen at home? '[]'$  Productive cough? '[]'$  Wheezing?  Neurologic:  '[]'$  Sudden weakness in arms or legs? '[]'$  Sudden numbness in arms or legs? '[]'$  Sudden onset of difficult speaking or slurred speech? '[]'$  Temporary loss of vision in one eye? '[]'$  Problems with dizziness?  Gastrointestinal:  '[]'$  Blood in stool? '[]'$  Vomited blood?  Genitourinary:  '[]'$  Burning when urinating? '[]'$  Blood in urine?  Psychiatric:  '[]'$  Major depression  Hematologic:  '[]'$  Bleeding problems? '[]'$  Problems with blood clotting?  Dermatologic:  '[]'$  Rashes or ulcers?  Constitutional:  '[]'$  Fever or chills?  Ear/Nose/Throat:  '[]'$  Change in hearing? '[]'$  Nose bleeds? '[]'$  Sore throat?  Musculoskeletal:  '[]'$  Back pain? '[]'$  Joint pain? '[]'$  Muscle pain?   Physical Examination   Vitals:   11/29/22 1339  BP: 112/65  Pulse: 63  Resp: 20  Temp: 98.2 F (36.8 C)  TempSrc: Temporal  SpO2: 98%  Weight: 156 lb (70.8 kg)  Height: '5\' 11"'$  (1.803 m)   Body mass index is 21.76 kg/m.  General:  WDWN in NAD; vital signs documented above Gait: Not observed HENT: WNL, normocephalic Pulmonary: normal non-labored breathing  Cardiac: regular rate and rhythm Abdomen: soft, NT, no masses Skin: without rashes Vascular Exam/Pulses: Palpable radial pulses 1+ bilaterally Extremities: without ischemic changes, without gangrene , without cellulitis; without  open wounds;  Musculoskeletal: no muscle wasting or atrophy  Neurologic: A&O X 3;  No focal weakness or paresthesias are detected Psychiatric:  The pt has Normal affect.  Non-Invasive Vascular Imaging   B Carotid Duplex (11/29/2022):  R ICA stenosis:  Occluded R VA:  patent and antegrade L ICA stenosis:  1-39%, patent stent L VA:  patent and antegrade  Left Carotid-Subclavian Bypass Left Graft #1: +--------------------+---++++  Inflow              217  +--------------------+---++++  Proximal Anastomosis308  +--------------------+---++++  Proximal Graft  260  +--------------------+---++++  Mid Graft           345  +--------------------+---++++  Distal Graft        371  +--------------------+---++++  Distal Anastamosis  377  +--------------------+---++++  Outflow            220  +--------------------+---++++   Medical Decision Making   Jesse Hansen is a 77 y.o. adult who presents for surveillance of carotid artery stenosis  Based on the patient's vascular studies, his carotid artery stenosis is unchanged.  His right ICA is known to be occluded.  His left ICA stenosis is 1 to 39% and he has a patent stent Study show a patent left carotid subclavian bypass graft.  There are elevated velocities throughout the graft, however no noted areas of stenosis.  This is likely due to tortuosity of the graft and we will continue to monitor The patient has palpable radial pulses and no longer has dizzy spells since carotid subclavian bypass He can follow-up with our office in 1 year with repeat carotid artery duplex study    Vicente Serene PA-C [facility] Office: [facility] MD: Trula Slade

## 2022-11-30 ENCOUNTER — Ambulatory Visit: Payer: No Typology Code available for payment source

## 2022-11-30 ENCOUNTER — Encounter (HOSPITAL_COMMUNITY): Payer: No Typology Code available for payment source

## 2022-12-16 LAB — LAB REPORT - SCANNED
A1c: 7.5
Albumin, Urine POC: 9.28
Albumin/Creatinine Ratio, Urine, POC: 212.8
Creatinine, POC: 44 mg/dL
EGFR (Non-African Amer.): 35

## 2023-02-21 ENCOUNTER — Other Ambulatory Visit: Payer: Self-pay | Admitting: Internal Medicine

## 2023-02-23 ENCOUNTER — Other Ambulatory Visit: Payer: Self-pay | Admitting: Internal Medicine

## 2023-02-24 ENCOUNTER — Telehealth: Payer: Self-pay

## 2023-02-24 NOTE — Telephone Encounter (Signed)
Very sorry as this is against the federal law to do hardcopy of controlled substances  - all meds are required to be done electronic

## 2023-02-24 NOTE — Telephone Encounter (Signed)
Is a written Rx appropriate for this request, please advise

## 2023-02-24 NOTE — Telephone Encounter (Signed)
Patient called and said his pharmacy is requested a hard copy of his LORazepam (ATIVAN) 1 MG tablet  to fill the prescription. Patient would like a call back to let him know if he needs to pick up a hard copy to bring to his pharmacy. [place] call back is [phone].

## 2023-02-28 ENCOUNTER — Telehealth: Payer: Self-pay

## 2023-02-28 MED ORDER — LORAZEPAM 1 MG PO TABS: ORAL_TABLET | ORAL | 0 refills | Status: DC

## 2023-02-28 NOTE — Telephone Encounter (Signed)
Ok done erx to [facility]

## 2023-02-28 NOTE — Telephone Encounter (Signed)
LOV 08/04/22 

## 2023-02-28 NOTE — Telephone Encounter (Signed)
Prescription Request  02/28/2023  LOV: Visit date not found  What is the name of the medication or equipment? LORazepam (ATIVAN) 1 MG tablet   Have you contacted your pharmacy to request a refill? No   Which pharmacy would you like this sent to?  [facility] - [place], [place] - [address] [place] [address] Phone: [phone] Fax: [phone]  [facility] [place] - [address] [place] [address] Phone: [phone] Fax: [phone]    Patient notified that their request is being sent to the clinical staff for review and that they should receive a response within 2 business days.   Please advise at Mobile [phone] (mobile)

## 2023-03-09 ENCOUNTER — Encounter: Payer: Self-pay | Admitting: Internal Medicine

## 2023-03-09 ENCOUNTER — Ambulatory Visit: Payer: No Typology Code available for payment source | Admitting: Internal Medicine

## 2023-03-09 VITALS — BP 120/82 | HR 64 | Temp 97.6°F | Ht 71.0 in | Wt 160.0 lb

## 2023-03-09 DIAGNOSIS — F411 Generalized anxiety disorder: Secondary | ICD-10-CM

## 2023-03-09 DIAGNOSIS — Z0001 Encounter for general adult medical examination with abnormal findings: Secondary | ICD-10-CM

## 2023-03-09 DIAGNOSIS — Z0000 Encounter for general adult medical examination without abnormal findings: Secondary | ICD-10-CM

## 2023-03-09 DIAGNOSIS — Z720 Tobacco use: Secondary | ICD-10-CM

## 2023-03-09 DIAGNOSIS — I10 Essential (primary) hypertension: Secondary | ICD-10-CM

## 2023-03-09 DIAGNOSIS — E119 Type 2 diabetes mellitus without complications: Secondary | ICD-10-CM

## 2023-03-09 MED ORDER — LORAZEPAM 1 MG PO TABS: ORAL_TABLET | ORAL | 2 refills | Status: DC

## 2023-03-09 NOTE — Progress Notes (Signed)
Patient ID: Jesse Hansen, adult   DOB: [birth year], 77 y.o.   MRN: [id number]         Chief Complaint:: wellness exam and anxiety, htn, dm, smoker       HPI:  Jesse Hansen is a 77 y.o. adult here for wellness exam; declines covid booster, dxa, hep c screen, A1c and urine microalbumin as he has labs done at the [place]; o/w up to date  Still smoking, not ready to quit                        Also s/p PPMaug 2023 at [facility] cardiology Dr Gary Fleet.  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  Pt denies chest pain, increased sob or doe, wheezing, orthopnea, PND, increased LE swelling, palpitations, dizziness or syncope.   Pt denies polydipsia, polyuria, or new focal neuro s/s.    Pt denies fever, wt loss, night sweats, loss of appetite, or other constitutional symptoms  Denies worsening depressive symptoms, suicidal ideation, or panic; has ongoing anxiety, asks for ativan refill.    Wt Readings from Last 3 Encounters:  03/09/23 160 lb (72.6 kg)  11/29/22 156 lb (70.8 kg)  08/04/22 169 lb (76.7 kg)   BP Readings from Last 3 Encounters:  03/09/23 120/82  11/29/22 112/65  08/04/22 118/72   Immunization History  Administered Date(s) Administered   Fluad Quad(high Dose 65+) 09/24/2021   Influenza Whole 07/27/2010   Influenza, High Dose Seasonal PF 08/28/2010, 11/04/2016, 10/12/2019, 11/23/2022   Influenza, Seasonal, Injecte, Preservative Fre 12/15/2015   Influenza-Unspecified 02/06/2002, 08/23/2003, 11/23/2003, 09/22/2004, 09/22/2005, 11/28/2009, 10/08/2011, 10/03/2014, 01/31/2015, 10/19/2017, 09/22/2018   Moderna Covid-19 Vaccine Bivalent Booster 41yrs & up 09/24/2021   Moderna Sars-Covid-2 Vaccination 12/19/2019, 01/16/2020, 10/31/2020, 06/11/2021   Pneumococcal Conjugate-13 09/13/2014, 04/04/2017   Pneumococcal Polysaccharide-23 11/22/2004, 05/04/2013   Pneumococcal-Unspecified 08/10/2002   Td 08/10/2002, 11/23/2003   Td (Adult),5 Lf Tetanus Toxid, Preservative Free 06/10/2022   Td  (Adult),unspecified 08/10/2002   Tdap 01/23/2012   Zoster Recombinat (Shingrix) 04/10/2020, 06/12/2020, 08/20/2020   Health Maintenance Due  Topic Date Due   Diabetic kidney evaluation - eGFR measurement  02/07/2023      Past Medical History:  Diagnosis Date   Agent orange exposure    Anxiety    Carotid artery disease    a. s/p carotid endarterectomy remotely.   Chronic systolic CHF (congestive heart failure)    COLONIC POLYPS, HX OF 01/29/2011   Qualifier: Diagnosis of  By: Jonny Ruiz MD, Len Blalock    Coronary artery disease involving native coronary artery with angina pectoris    a. s/p Multiple PCIs. b. CABG x 3 in 2013 (SVG-OM1, SVG-Diag with freeLIMA-LAD from SVG-Diag hood) --> relook cath in ~2015 with at least 1 graft ~occluded by report.   Diabetes mellitus with circulatory complication    ERECTILE DYSFUNCTION 08/15/2007   Qualifier: Diagnosis of  By: Dance CMA (AAMA), Kim     Essential hypertension    Hyperlipidemia    Insomnia    LBBB (left bundle branch block)    Pneumonia    PTSD 07/30/2010   Qualifier: Diagnosis of  By: Jonny Ruiz MD, Len Blalock    PVD (peripheral vascular disease)    a. s/p AOBF and LCE in the 1990s.   Past Surgical History:  Procedure Laterality Date   ABDOMINAL AORTIC ANEURYSM REPAIR  1996?   Done at [facility] by Dr. Arbie Cookey   CAROTID ENDARTERECTOMY Left    Dr.  Keiffer at [place] in [place]    CAROTID PTA/STENT INTERVENTION Left 03/24/2017   Procedure: Carotid PTA/Stent Intervention;  Surgeon: Nada Libman, MD;  Location: [facility] INVASIVE [facility];  Service: Cardiovascular;  Laterality: Left;   CAROTID-SUBCLAVIAN BYPASS GRAFT Left 02/05/2022   Procedure: LEFT CAROTID-SUBCLAVIAN BYPASS GRAFT;  Surgeon: Nada Libman, MD;  [facility] OR;  Service: Vascular;  Laterality: Left;   CORONARY ARTERY BYPASS GRAFT  2013   @ [facility] [place], [place] (b/c [facility] OR was contaminated) -- SVG-Diag with freeLIMA-LAD sewn to SVG hood, SVG-OM1   CORONARY STENT  PLACEMENT  2007   LEFT HEART CATH AND CORS/GRAFTS ANGIOGRAPHY N/A 01/05/2018   Procedure: LEFT HEART CATH AND CORS/GRAFTS ANGIOGRAPHY;  Surgeon: Swaziland, Peter M, MD;  Location: [facility] INVAS[facility] CV LAB;  Service: Cardiovascular;  Laterality: N/A;   PROSTATE BIOPSY  2009    reports that he has been smoking cigarettes. He has been smoking an average of .25 packs per day. He has never been exposed to tobacco smoke. He has never used smokeless tobacco. He reports that he does not currently use alcohol. He reports that he does not use drugs. family history includes Hypertension in his father. Allergies  Allergen Reactions   Oxycodone-Acetaminophen Shortness Of Breath    Patient tolerates Tylenol   Codeine Nausea And Vomiting and Itching    Other reaction(s): Paresthesia, Finding of vomiting   Nicotine     Nicotine Patch--Per Nurse "heart issues"   Niacin Other (See Comments)    unknown   Current Outpatient Medications on File Prior to Visit  Medication Sig Dispense Refill   acetaminophen (TYLENOL) 500 MG tablet Take 500-1,000 mg by mouth every 6 (six) hours as needed (for pain.).     albuterol (VENTOLIN HFA) 108 (90 Base) MCG/ACT inhaler Inhale 2 puffs into the lungs 2 (two) times daily.     amiodarone (PACERONE) 200 MG tablet Take 200 mg by mouth daily.     ammonium lactate (LAC-HYDRIN) 12 % lotion Apply 1 application. topically as needed for dry skin.     aspirin EC 81 MG tablet Take 1 tablet (81 mg total) by mouth daily. (Patient taking differently: Take 81 mg by mouth every evening.) 90 tablet 11   BLACK WALNUT POLLEN [place] Place 1 drop under the tongue every morning.     carboxymethylcellulose (REFRESH PLUS) 0.5 % SOLN Place 1 drop into both eyes 3 (three) times daily as needed (dry/irritated eyes).     carvedilol (COREG) 6.25 MG tablet Take 6.25 mg by mouth 2 (two) times daily with a meal.     Cholecalciferol (VITAMIN D3) 50 MCG (2000 UT) TABS Take 50 mcg by mouth every morning.     clopidogrel  (PLAVIX) 75 MG tablet Take 75 mg by mouth daily.     Coenzyme Q10 (COQ10) 50 MG CAPS Take 50 mg by mouth every morning.     ELDERBERRY PO Take 1 capsule by mouth daily.     ferrous sulfate 325 (65 FE) MG tablet Take 325 mg by mouth every morning.     furosemide (LASIX) 40 MG tablet Take 1 tablet (40 mg total) by mouth daily. (Patient taking differently: Take 40 mg by mouth every other day.) 60 tablet 2   glipiZIDE (GLUCOTROL) 5 MG tablet Take 5 mg by mouth 2 (two) times daily before a meal. Take 30 minutes prior to meals.     icosapent Ethyl (VASCEPA) 1 g capsule Take 1 g by mouth 2 (  two) times daily.     ipratropium (ATROVENT) 0.03 % nasal spray Place 2 sprays into both nostrils 2 (two) times daily as needed for rhinitis.     metFORMIN (GLUCOPHAGE) 500 MG tablet Take 1 tablet (500 mg total) by mouth 2 (two) times daily with a meal.     nitroGLYCERIN (NITROSTAT) 0.4 MG SL tablet Place 0.4 mg under the tongue every 5 (five) minutes x 3 doses as needed for chest pain.     potassium chloride SA (KLOR-CON) 20 MEQ tablet Take 20 mEq by mouth daily. In the morning     rosuvastatin (CRESTOR) 40 MG tablet Take 40 mg by mouth every evening.     vitamin C (ASCORBIC ACID) 500 MG tablet Take 500 mg by mouth every morning.     No current facility-administered medications on file prior to visit.        ROS:  All others reviewed and negative.  Objective        PE:  BP 120/82 (BP Location: Right Arm, Patient Position: Sitting, Cuff Size: Normal)   Pulse 64   Temp 97.6 F (36.4 C) (Oral)   Ht 5\' 11"  (1.803 m)   Wt 160 lb (72.6 kg)   SpO2 99%   BMI 22.32 kg/m                 Constitutional: Pt appears in NAD               HENT: Head: NCAT.                Right Ear: External ear normal.                 Left Ear: External ear normal.                Eyes: . Pupils are equal, round, and reactive to light. Conjunctivae and EOM are normal               Nose: without d/c or deformity               Neck:  Neck supple. Gross normal ROM               Cardiovascular: Normal rate and regular rhythm.                 Pulmonary/Chest: Effort normal and breath sounds without rales or wheezing.                Abd:  Soft, NT, ND, + BS, no organomegaly               Neurological: Pt is alert. At baseline orientation, motor grossly intact               Skin: Skin is warm. No rashes, no other new lesions, LE edema - none               Psychiatric: Pt behavior is normal without agitation   Micro: none  Cardiac tracings I have personally interpreted today:  none  Pertinent Radiological findings (summarize): none   Lab Results  Component Value Date   WBC 10.9 (H) 02/06/2022   HGB 13.5 02/06/2022   HCT 38.4 (L) 02/06/2022   PLT 200 02/06/2022   GLUCOSE 158 (H) 02/06/2022   CHOL 137 02/06/2022   TRIG 81 02/06/2022   HDL 29 (L) 02/06/2022   LDLCALC 92 02/06/2022   ALT 21 02/03/2022   AST 21 02/03/2022   NA  136 02/06/2022   K 3.9 02/06/2022   CL 105 02/06/2022   CREATININE 1.52 (H) 02/06/2022   BUN 21 02/06/2022   CO2 22 02/06/2022   TSH 1.139 01/04/2018   INR 1.1 02/03/2022   HGBA1C 6.8 (H) 02/03/2022   Assessment/Plan:  Manny Vitolo Nadeem is a 77 y.o. White or Caucasian [1] adult with  has a past medical history of Agent orange exposure, Anxiety, Carotid artery disease, Chronic systolic CHF (congestive heart failure), COLONIC POLYPS, HX OF (01/29/2011), Coronary artery disease involving native coronary artery with angina pectoris, Diabetes mellitus with circulatory complication, ERECTILE DYSFUNCTION (08/15/2007), Essential hypertension, Hyperlipidemia, Insomnia, LBBB (left bundle branch block), Pneumonia, PTSD (07/30/2010), and PVD (peripheral vascular disease).  Encounter for well adult exam with abnormal findings Age and sex appropriate education and counseling updated with regular exercise and diet Referrals for preventative services - declines dxa, hep c screen, A1c and  microalbumin Immunizations addressed - declines covid booster Smoking counseling  - pt counsled to quit, pt not ready Evidence for depression or other mood disorder - chronic anxiety stable overall, for ativan refill Most recent labs reviewed. I have personally reviewed and have noted: 1) the patient's medical and social history 2) The patient's current medications and supplements 3) The patient's height, weight, and BMI have been recorded in the chart   Anxiety state Stable, for ativan prn refill  Essential hypertension BP Readings from Last 3 Encounters:  03/09/23 120/82  11/29/22 112/65  08/04/22 118/72   Stable, pt to continue medical treatment coreg 6.25 mg bid   Tobacco abuse Pt counseled to quit, pt not ready  Type 2 diabetes mellitus without complications (HCC) Lab Results  Component Value Date   HGBA1C 6.8 (H) 02/03/2022   Stable at last labs, declines further labs,, pt to continue current medical treatment glipizide 5 bid, metformin 500 bid  Followup: Return in about 6 months (around 09/08/2023).  Oliver Barre, MD 03/12/2023 3:30 PM [facility]

## 2023-03-09 NOTE — Patient Instructions (Signed)
Please continue all other medications as before, and refills have been done if requested - the ativan  Please have the pharmacy call with any other refills you may need.  Please continue your efforts at being more active, low cholesterol diet, and weight control.  You are otherwise up to date with prevention measures today.  Please keep your appointments with your specialists as you may have planned  We can hold on furhter lab testing today  Please make an Appointment to return in 6 months, or sooner if needed

## 2023-03-12 ENCOUNTER — Encounter: Payer: Self-pay | Admitting: Internal Medicine

## 2023-03-12 NOTE — Assessment & Plan Note (Signed)
BP Readings from Last 3 Encounters:  03/09/23 120/82  11/29/22 112/65  08/04/22 118/72   Stable, pt to continue medical treatment coreg 6.25 mg bid

## 2023-03-12 NOTE — Assessment & Plan Note (Signed)
Lab Results  Component Value Date   HGBA1C 6.8 (H) 02/03/2022   Stable at last labs, declines further labs,, pt to continue current medical treatment glipizide 5 bid, metformin 500 bid

## 2023-03-12 NOTE — Assessment & Plan Note (Signed)
Stable, for ativan prn refill

## 2023-03-12 NOTE — Assessment & Plan Note (Signed)
Age and sex appropriate education and counseling updated with regular exercise and diet Referrals for preventative services - declines dxa, hep c screen, A1c and microalbumin Immunizations addressed - declines covid booster Smoking counseling  - pt counsled to quit, pt not ready Evidence for depression or other mood disorder - chronic anxiety stable overall, for ativan refill Most recent labs reviewed. I have personally reviewed and have noted: 1) the patient's medical and social history 2) The patient's current medications and supplements 3) The patient's height, weight, and BMI have been recorded in the chart

## 2023-03-12 NOTE — Assessment & Plan Note (Signed)
Pt counseled to quit, pt not ready 

## 2023-04-13 DIAGNOSIS — C3431 Malignant neoplasm of lower lobe, right bronchus or lung: Secondary | ICD-10-CM | POA: Insufficient documentation

## 2023-06-13 ENCOUNTER — Other Ambulatory Visit: Payer: Self-pay | Admitting: Internal Medicine

## 2023-06-13 ENCOUNTER — Telehealth: Payer: Self-pay | Admitting: Internal Medicine

## 2023-06-13 MED ORDER — LORAZEPAM 1 MG PO TABS: ORAL_TABLET | ORAL | 2 refills | Status: DC

## 2023-06-13 NOTE — Telephone Encounter (Signed)
Send Lorazepam [facility] - [place], [place] - [address] [place] [address] Phone: [phone] Fax: [phone]

## 2023-06-13 NOTE — Addendum Note (Signed)
Addended by: Corwin Levins on: 06/13/2023 04:44 PM   Modules accepted: Orders

## 2023-06-13 NOTE — Telephone Encounter (Signed)
Prescription Request  06/13/2023  LOV: 03/09/2023  What is the name of the medication or equipment?  LORazepam (ATIVAN) 1 MG tablet     Have you contacted your pharmacy to request a refill? No Which pharmacy would you like this sent to?  [facility] - [place], [place] - [address] [place] [address] Phone: [phone] Fax: [phone]  [facility] - [place], [place] - [address] [place] [address] Phone: [phone] Fax: [phone]    Patient notified that their request is being sent to the clinical staff for review and that they should receive a response within 2 business days.   Please advise at Mobile [phone] (mobile)

## 2023-06-13 NOTE — Telephone Encounter (Signed)
Done erx 

## 2023-06-22 ENCOUNTER — Other Ambulatory Visit: Payer: Self-pay

## 2023-06-22 ENCOUNTER — Ambulatory Visit: Payer: No Typology Code available for payment source | Admitting: Internal Medicine

## 2023-06-22 MED ORDER — LORAZEPAM 1 MG PO TABS: ORAL_TABLET | ORAL | 2 refills | Status: DC

## 2023-06-22 NOTE — Telephone Encounter (Signed)
Done  erx to [facility]

## 2023-06-22 NOTE — Telephone Encounter (Signed)
Patient was told to come in today due to him being told he needed to be seen for medication and patient didn't need to due to the fact that he had a physical in April.

## 2023-06-26 NOTE — Progress Notes (Signed)
error 

## 2023-07-01 LAB — LAB REPORT - SCANNED
A1c: 9.3
Creatinine, POC: 24 mg/dL
EGFR: 36
Microalb Creat Ratio: 74.9
Microalbumin, Urine: 1.8

## 2023-08-09 ENCOUNTER — Encounter: Payer: No Typology Code available for payment source | Admitting: Internal Medicine

## 2023-09-17 ENCOUNTER — Other Ambulatory Visit: Payer: Self-pay | Admitting: Internal Medicine

## 2023-09-19 ENCOUNTER — Telehealth: Payer: Self-pay | Admitting: Internal Medicine

## 2023-09-19 MED ORDER — LORAZEPAM 1 MG PO TABS: ORAL_TABLET | ORAL | 2 refills | Status: DC

## 2023-09-19 NOTE — Telephone Encounter (Signed)
Prescription Request  09/19/2023  LOV: 03/09/2023  What is the name of the medication or equipment? LORazepam (ATIVAN) 1 MG tablet   Have you contacted your pharmacy to request a refill? No   Which  [facility] - [place], [place] - [address] [place] [address] Phone: [phone] Fax: [phone] pharmacy would you like this sent to?     Patient notified that their request is being sent to the clinical staff for review and that they should receive a response within 2 business days.   Please advise at Mobile [phone] (mobile)

## 2023-09-19 NOTE — Telephone Encounter (Signed)
Done erx 

## 2023-09-26 DIAGNOSIS — Z95810 Presence of automatic (implantable) cardiac defibrillator: Secondary | ICD-10-CM | POA: Insufficient documentation

## 2023-09-28 ENCOUNTER — Encounter: Payer: No Typology Code available for payment source | Admitting: Internal Medicine

## 2023-09-29 ENCOUNTER — Encounter: Payer: Self-pay | Admitting: Internal Medicine

## 2023-09-29 ENCOUNTER — Ambulatory Visit (INDEPENDENT_AMBULATORY_CARE_PROVIDER_SITE_OTHER): Payer: Self-pay | Admitting: Internal Medicine

## 2023-09-29 VITALS — BP 126/74 | HR 60 | Temp 98.0°F | Ht 71.0 in | Wt 161.0 lb

## 2023-09-29 DIAGNOSIS — I10 Essential (primary) hypertension: Secondary | ICD-10-CM

## 2023-09-29 DIAGNOSIS — I5041 Acute combined systolic (congestive) and diastolic (congestive) heart failure: Secondary | ICD-10-CM | POA: Insufficient documentation

## 2023-09-29 DIAGNOSIS — I429 Cardiomyopathy, unspecified: Secondary | ICD-10-CM | POA: Insufficient documentation

## 2023-09-29 DIAGNOSIS — H25812 Combined forms of age-related cataract, left eye: Secondary | ICD-10-CM | POA: Insufficient documentation

## 2023-09-29 DIAGNOSIS — M545 Low back pain, unspecified: Secondary | ICD-10-CM | POA: Insufficient documentation

## 2023-09-29 DIAGNOSIS — E119 Type 2 diabetes mellitus without complications: Secondary | ICD-10-CM

## 2023-09-29 DIAGNOSIS — F411 Generalized anxiety disorder: Secondary | ICD-10-CM

## 2023-09-29 NOTE — Assessment & Plan Note (Signed)
BP Readings from Last 3 Encounters:  09/29/23 126/74  03/09/23 120/82  11/29/22 112/65   Stable, pt to continue medical treatment coreg 6.25 bid

## 2023-09-29 NOTE — Assessment & Plan Note (Signed)
Stable overall, cont inie ativan prn

## 2023-09-29 NOTE — Progress Notes (Signed)
Patient ID: Jesse Hansen, adult   DOB: 1946-05-26, 77 y.o.   MRN: [id number]        Chief Complaint: follow up anxiety, htn,dm       HPI:  Jesse Hansen is a 77 y.o. adult here overall doing ok, Pt denies chest pain, increased sob or doe, wheezing, orthopnea, PND, increased LE swelling, palpitations, dizziness or syncope.   Pt denies polydipsia, polyuria, or new focal neuro s/s.    Pt denies fever, wt loss, night sweats, loss of appetite, or other constitutional symptoms   Denies worsening depressive symptoms, suicidal ideation, or panic; has ongoing anxiety, asking for routine ativan refill.  Also, Since last seen had [facility] place, complicate by pna, then covid infection shortly after.  Quit smoking x 5 months.  Pt states cardiology told him he is not surgury candidate in the future due to high risk CAD and lower renal function. For cataract surgury next wk.  Also tx at [facility] for recent small lung cancer tx with Jesse Hansen has small mass to left upper kidney now, for MRI soon for further evaluation.   Wt Readings from Last 3 Encounters:  09/29/23 161 lb (73 kg)  03/09/23 160 lb (72.6 kg)  11/29/22 156 lb (70.8 kg)   BP Readings from Last 3 Encounters:  09/29/23 126/74  03/09/23 120/82  11/29/22 112/65         Past Medical History:  Diagnosis Date   Agent orange exposure    Anxiety    Carotid artery disease (HCC)    a. s/p carotid endarterectomy remotely.   Chronic systolic CHF (congestive heart failure) (HCC)    COLONIC POLYPS, HX OF 01/29/2011   Qualifier: Diagnosis of  By: Jonny Ruiz MD, Len Blalock    Coronary artery disease involving native coronary artery with angina pectoris [facility])    a. s/p Multiple PCIs. b. CABG x 3 in 2013 (SVG-OM1, SVG-Diag with freeLIMA-LAD from SVG-Diag hood) --> relook cath in ~2015 with at least 1 graft ~occluded by report.   Diabetes mellitus with circulatory complication [facility])    ERECTILE DYSFUNCTION 08/15/2007   Qualifier: Diagnosis of  By: Dance CMA (AAMA), Kim      Essential hypertension    Hyperlipidemia    Insomnia    LBBB (left bundle branch block)    Pneumonia    PTSD 07/30/2010   Qualifier: Diagnosis of  By: Jonny Ruiz MD, Len Blalock    PVD (peripheral vascular disease) (HCC)    a. s/p AOBF and LCE in the 1990s.   Past Surgical History:  Procedure Laterality Date   ABDOMINAL AORTIC ANEURYSM REPAIR  1996?   Done at [facility] by Dr. Arbie Cookey   CAROTID ENDARTERECTOMY Left    Dr. Carmon Ginsberg at [facility] in [place]    CAROTID PTA/STENT INTERVENTION Left 03/24/2017   Procedure: Carotid PTA/Stent Intervention;  Surgeon: Nada Libman, MD;  Location: [facility] INVASIVE CV LAB;  Service: Cardiovascular;  Laterality: Left;   CAROTID-SUBCLAVIAN BYPASS GRAFT Left 02/05/2022   Procedure: LEFT CAROTID-SUBCLAVIAN BYPASS GRAFT;  Surgeon: Nada Libman, MD;  Location: [facility] OR;  Service: Vascular;  Laterality: Left;   CORONARY ARTERY BYPASS GRAFT  2013   @ [facility] [place], [place] (b/c [facility] OR was contaminated) -- SVG-Diag with freeLIMA-LAD sewn to SVG hood, SVG-OM1   CORONARY STENT PLACEMENT  2007   LEFT HEART CATH AND CORS/GRAFTS ANGIOGRAPHY N/A 01/05/2018   Procedure: LEFT HEART CATH AND CORS/GRAFTS ANGIOGRAPHY;  Surgeon: Swazil[place], Peter M, MD;  Location:  [facility] INVASIVE CV LAB;  Service: Cardiovascular;  Laterality: N/A;   PROSTATE BIOPSY  2009    reports that he has been smoking cigarettes. He has never been exposed to tobacco smoke. He has never used smokeless tobacco. He reports that he does not currently use alcohol. He reports that he does not use drugs. family history includes Hypertension in his father. Allergies  Allergen Reactions   Oxycodone-Acetaminophen Shortness Of Breath    Patient tolerates Tylenol   Codeine Nausea And Vomiting and Itching    Other reaction(s): Paresthesia, Finding of vomiting   Nicotine     Nicotine Patch--Per Nurse "heart issues"   Niacin Other (See Comments)    unknown   Current Outpatient Medications on File Prior to Visit   Medication Sig Dispense Refill   acetaminophen (TYLENOL) 500 MG tablet Take 500-1,000 mg by mouth every 6 (six) hours as needed (for pain.).     albuterol (VENTOLIN HFA) 108 (90 Base) MCG/ACT inhaler Inhale 2 puffs into the lungs 2 (two) times daily.     amiodarone (PACERONE) 200 MG tablet Take 200 mg by mouth daily.     ammonium lactate (LAC-HYDRIN) 12 % lotion Apply 1 application. topically as needed for dry skin.     aspirin EC 81 MG tablet Take 1 tablet (81 mg total) by mouth daily. (Patient taking differently: Take 81 mg by mouth every evening.) 90 tablet 11   BLACK WALNUT POLLEN [place] Place 1 drop under the tongue every morning.     carboxymethylcellulose (REFRESH PLUS) 0.5 % SOLN Place 1 drop into both eyes 3 (three) times daily as needed (dry/irritated eyes).     carvedilol (COREG) 6.25 MG tablet Take 6.25 mg by mouth 2 (two) times daily with a meal.     Cholecalciferol (VITAMIN D3) 50 MCG (2000 UT) TABS Take 50 mcg by mouth every morning.     clopidogrel (PLAVIX) 75 MG tablet Take 75 mg by mouth daily.     Coenzyme Q10 (COQ10) 50 MG CAPS Take 50 mg by mouth every morning.     ELDERBERRY PO Take 1 capsule by mouth daily.     ferrous sulfate 325 (65 FE) MG tablet Take 325 mg by mouth every morning.     furosemide (LASIX) 40 MG tablet Take 1 tablet (40 mg total) by mouth daily. (Patient taking differently: Take 40 mg by mouth every other day.) 60 tablet 2   glipiZIDE (GLUCOTROL) 5 MG tablet Take 5 mg by mouth 2 (two) times daily before a meal. Take 30 minutes prior to meals.     icosapent Ethyl (VASCEPA) 1 g capsule Take 1 g by mouth 2 (two) times daily.     ipratropium (ATROVENT) 0.03 % nasal spray Place 2 sprays into both nostrils 2 (two) times daily as needed for rhinitis.     LORazepam (ATIVAN) 1 MG tablet TAKE ONE (1) TABLET BY MOUTH TWO (2) TIMES DAILY AS NEEDED 60 tablet 2   nitroGLYCERIN (NITROSTAT) 0.4 MG SL tablet Place 0.4 mg under the tongue every 5 (five) minutes x 3 doses as  needed for chest pain.     potassium chloride SA (KLOR-CON) 20 MEQ tablet Take 20 mEq by mouth daily. In the morning     rosuvastatin (CRESTOR) 40 MG tablet Take 40 mg by mouth every evening.     vitamin C (ASCORBIC ACID) 500 MG tablet Take 500 mg by mouth every morning.     No current facility-administered medications on file prior to visit.  ROS:  All others reviewed and negative.  Objective        PE:  BP 126/74 (BP Location: Left Arm, Patient Position: Sitting, Cuff Size: Normal)   Pulse 60   Temp 98 F (36.7 C) (Oral)   Ht 5\' 11"  (1.803 m)   Wt 161 lb (73 kg)   SpO2 99%   BMI 22.45 kg/m                 Constitutional: Pt appears in NAD               HENT: Head: NCAT.                Right Ear: External ear normal.                 Left Ear: External ear normal.                Eyes: . Pupils are equal, round, and reactive to light. Conjunctivae and EOM are normal               Nose: without d/c or deformity               Neck: Neck supple. Gross normal ROM               Cardiovascular: Normal rate and regular rhythm.                 Pulmonary/Chest: Effort normal and breath sounds without rales or wheezing.                Abd:  Soft, NT, ND, + BS, no organomegaly               Neurological: Pt is alert. At baseline orientation, motor grossly intact               Skin: Skin is warm. No rashes, no other new lesions, LE edema - none               Psychiatric: Pt behavior is normal without agitation   Micro: none  Cardiac tracings I have personally interpreted today:  none  Pertinent Radiological findings (summarize): none   Lab Results  Component Value Date   WBC 10.9 (H) 02/06/2022   HGB 13.5 02/06/2022   HCT 38.4 (L) 02/06/2022   PLT 200 02/06/2022   GLUCOSE 158 (H) 02/06/2022   CHOL 137 02/06/2022   TRIG 81 02/06/2022   HDL 29 (L) 02/06/2022   LDLCALC 92 02/06/2022   ALT 21 02/03/2022   AST 21 02/03/2022   NA 136 02/06/2022   K 3.9 02/06/2022   CL 105  02/06/2022   CREATININE 1.52 (H) 02/06/2022   BUN 21 02/06/2022   CO2 22 02/06/2022   TSH 1.139 01/04/2018   INR 1.1 02/03/2022   HGBA1C 6.8 (H) 02/03/2022   Assessment/Plan:  Jesse Hansen is a 77 y.o. White or Caucasian [1] adult with  has a past medical history of Agent orange exposure, Anxiety, Carotid artery disease (HCC), Chronic systolic CHF (congestive heart failure) (HCC), COLONIC POLYPS, HX OF (01/29/2011), Coronary artery disease involving native coronary artery with angina pectoris (HCC), Diabetes mellitus with circulatory complication (HCC), ERECTILE DYSFUNCTION (08/15/2007), Essential hypertension, Hyperlipidemia, Insomnia, LBBB (left bundle branch block), Pneumonia, PTSD (07/30/2010), and PVD (peripheral vascular disease) (HCC).  Type 2 diabetes mellitus without complications (HCC) Uncontrolled, With recent incresaed glipizide, metformin stopped due to worsening renal fxn,  and adding once dialy insulin after  aug 6 A1c of 9.3; pt with no low sugars and overall feels improved, [facility], declines other lab today  Essential hypertension BP Readings from Last 3 Encounters:  09/29/23 126/74  03/09/23 120/82  11/29/22 112/65   Stable, pt to continue medical treatment coreg 6.25 bid   Anxiety state Stable overall, cont inie ativan prn  Followup: Return in about 6 months (around 03/28/2024).  Oliver Barre, MD 09/29/2023 1:17 PM [facility]

## 2023-09-29 NOTE — Assessment & Plan Note (Addendum)
Uncontrolled, With recent incresaed glipizide, metformin stopped due to worsening renal fxn,  and adding once dialy insulin after aug 6 A1c of 9.3; pt with no low sugars and overall feels improved, pt [facility], declines other lab today

## 2023-09-29 NOTE — Patient Instructions (Signed)
Please continue all other medications as before, and refills have been done if requested.  Please have the pharmacy call with any other refills you may need.  Please continue your efforts at being more active, low cholesterol diet, and weight control.  Please keep your appointments with your specialists as you may have planned  - [facility] for [facility] followup

## 2023-11-24 ENCOUNTER — Emergency Department (HOSPITAL_COMMUNITY): Payer: No Typology Code available for payment source

## 2023-11-24 ENCOUNTER — Inpatient Hospital Stay (HOSPITAL_COMMUNITY)
Admission: EM | Admit: 2023-11-24 | Discharge: 2023-11-27 | DRG: 291 | Disposition: A | Discharge status: Home / Self Care | Payer: No Typology Code available for payment source | Attending: Internal Medicine | Admitting: Internal Medicine

## 2023-11-24 ENCOUNTER — Other Ambulatory Visit: Payer: Self-pay

## 2023-11-24 DIAGNOSIS — E1159 Type 2 diabetes mellitus with other circulatory complications: Secondary | ICD-10-CM | POA: Diagnosis present

## 2023-11-24 DIAGNOSIS — I5023 Acute on chronic systolic (congestive) heart failure: Secondary | ICD-10-CM | POA: Diagnosis present

## 2023-11-24 DIAGNOSIS — E782 Mixed hyperlipidemia: Secondary | ICD-10-CM | POA: Diagnosis not present

## 2023-11-24 DIAGNOSIS — Z955 Presence of coronary angioplasty implant and graft: Secondary | ICD-10-CM

## 2023-11-24 DIAGNOSIS — Z79899 Other long term (current) drug therapy: Secondary | ICD-10-CM

## 2023-11-24 DIAGNOSIS — Z923 Personal history of irradiation: Secondary | ICD-10-CM

## 2023-11-24 DIAGNOSIS — E119 Type 2 diabetes mellitus without complications: Secondary | ICD-10-CM

## 2023-11-24 DIAGNOSIS — I130 Hypertensive heart and chronic kidney disease with heart failure and stage 1 through stage 4 chronic kidney disease, or unspecified chronic kidney disease: Principal | ICD-10-CM | POA: Diagnosis present

## 2023-11-24 DIAGNOSIS — J9601 Acute respiratory failure with hypoxia: Secondary | ICD-10-CM | POA: Diagnosis present

## 2023-11-24 DIAGNOSIS — I5021 Acute systolic (congestive) heart failure: Secondary | ICD-10-CM | POA: Diagnosis not present

## 2023-11-24 DIAGNOSIS — R7989 Other specified abnormal findings of blood chemistry: Secondary | ICD-10-CM | POA: Diagnosis not present

## 2023-11-24 DIAGNOSIS — I10 Essential (primary) hypertension: Secondary | ICD-10-CM | POA: Diagnosis not present

## 2023-11-24 DIAGNOSIS — E1122 Type 2 diabetes mellitus with diabetic chronic kidney disease: Secondary | ICD-10-CM | POA: Diagnosis present

## 2023-11-24 DIAGNOSIS — Z8673 Personal history of transient ischemic attack (TIA), and cerebral infarction without residual deficits: Secondary | ICD-10-CM

## 2023-11-24 DIAGNOSIS — F17210 Nicotine dependence, cigarettes, uncomplicated: Secondary | ICD-10-CM | POA: Diagnosis present

## 2023-11-24 DIAGNOSIS — N1832 Chronic kidney disease, stage 3b: Secondary | ICD-10-CM | POA: Diagnosis present

## 2023-11-24 DIAGNOSIS — E785 Hyperlipidemia, unspecified: Secondary | ICD-10-CM | POA: Diagnosis present

## 2023-11-24 DIAGNOSIS — E1151 Type 2 diabetes mellitus with diabetic peripheral angiopathy without gangrene: Secondary | ICD-10-CM | POA: Diagnosis present

## 2023-11-24 DIAGNOSIS — R918 Other nonspecific abnormal finding of lung field: Secondary | ICD-10-CM | POA: Diagnosis not present

## 2023-11-24 DIAGNOSIS — J441 Chronic obstructive pulmonary disease with (acute) exacerbation: Secondary | ICD-10-CM | POA: Diagnosis present

## 2023-11-24 DIAGNOSIS — Z7984 Long term (current) use of oral hypoglycemic drugs: Secondary | ICD-10-CM | POA: Diagnosis not present

## 2023-11-24 DIAGNOSIS — I509 Heart failure, unspecified: Secondary | ICD-10-CM

## 2023-11-24 DIAGNOSIS — I2489 Other forms of acute ischemic heart disease: Secondary | ICD-10-CM | POA: Diagnosis present

## 2023-11-24 DIAGNOSIS — Z885 Allergy status to narcotic agent status: Secondary | ICD-10-CM

## 2023-11-24 DIAGNOSIS — Z8249 Family history of ischemic heart disease and other diseases of the circulatory system: Secondary | ICD-10-CM

## 2023-11-24 DIAGNOSIS — Z7902 Long term (current) use of antithrombotics/antiplatelets: Secondary | ICD-10-CM

## 2023-11-24 DIAGNOSIS — I2510 Atherosclerotic heart disease of native coronary artery without angina pectoris: Secondary | ICD-10-CM | POA: Diagnosis present

## 2023-11-24 DIAGNOSIS — C3491 Malignant neoplasm of unspecified part of right bronchus or lung: Secondary | ICD-10-CM | POA: Diagnosis present

## 2023-11-24 DIAGNOSIS — I739 Peripheral vascular disease, unspecified: Secondary | ICD-10-CM | POA: Diagnosis not present

## 2023-11-24 DIAGNOSIS — Z1152 Encounter for screening for COVID-19: Secondary | ICD-10-CM | POA: Diagnosis not present

## 2023-11-24 DIAGNOSIS — D649 Anemia, unspecified: Secondary | ICD-10-CM | POA: Diagnosis present

## 2023-11-24 DIAGNOSIS — Z951 Presence of aortocoronary bypass graft: Secondary | ICD-10-CM

## 2023-11-24 DIAGNOSIS — Z7982 Long term (current) use of aspirin: Secondary | ICD-10-CM

## 2023-11-24 DIAGNOSIS — I252 Old myocardial infarction: Secondary | ICD-10-CM | POA: Diagnosis not present

## 2023-11-24 DIAGNOSIS — I255 Ischemic cardiomyopathy: Secondary | ICD-10-CM | POA: Diagnosis present

## 2023-11-24 DIAGNOSIS — Z8701 Personal history of pneumonia (recurrent): Secondary | ICD-10-CM | POA: Diagnosis not present

## 2023-11-24 DIAGNOSIS — Z95810 Presence of automatic (implantable) cardiac defibrillator: Secondary | ICD-10-CM | POA: Diagnosis present

## 2023-11-24 DIAGNOSIS — I350 Nonrheumatic aortic (valve) stenosis: Secondary | ICD-10-CM | POA: Diagnosis present

## 2023-11-24 DIAGNOSIS — Z794 Long term (current) use of insulin: Secondary | ICD-10-CM | POA: Diagnosis not present

## 2023-11-24 DIAGNOSIS — Z888 Allergy status to other drugs, medicaments and biological substances status: Secondary | ICD-10-CM

## 2023-11-24 DIAGNOSIS — F4310 Post-traumatic stress disorder, unspecified: Secondary | ICD-10-CM | POA: Diagnosis present

## 2023-11-24 LAB — COMPREHENSIVE METABOLIC PANEL
ALT: 52 U/L — ABNORMAL HIGH (ref 0–44)
AST: 33 U/L (ref 15–41)
Albumin: 3.3 g/dL — ABNORMAL LOW (ref 3.5–5.0)
Alkaline Phosphatase: 62 U/L (ref 38–126)
Anion gap: 14 (ref 5–15)
BUN: 31 mg/dL — ABNORMAL HIGH (ref 8–23)
CO2: 21 mmol/L — ABNORMAL LOW (ref 22–32)
Calcium: 9.1 mg/dL (ref 8.9–10.3)
Chloride: 101 mmol/L (ref 98–111)
Creatinine, Ser: 1.99 mg/dL — ABNORMAL HIGH (ref 0.61–1.24)
GFR, Estimated: 34 mL/min — ABNORMAL LOW (ref >60)
Glucose, Bld: 183 mg/dL — ABNORMAL HIGH (ref 70–99)
Potassium: 4.1 mmol/L (ref 3.5–5.1)
Sodium: 136 mmol/L (ref 135–145)
Total Bilirubin: 1 mg/dL (ref 0.0–1.2)
Total Protein: 6.2 g/dL — ABNORMAL LOW (ref 6.5–8.1)

## 2023-11-24 LAB — RESP PANEL BY RT-PCR (RSV, FLU A&B, COVID)  RVPGX2
Influenza A by PCR: NEGATIVE
Influenza B by PCR: NEGATIVE
Resp Syncytial Virus by PCR: NEGATIVE
SARS Coronavirus 2 by RT PCR: NEGATIVE

## 2023-11-24 LAB — CBC WITH DIFFERENTIAL/PLATELET
Abs Immature Granulocytes: 0.02 10*3/uL (ref 0.00–0.07)
Basophils Absolute: 0 10*3/uL (ref 0.0–0.1)
Basophils Relative: 0 %
Eosinophils Absolute: 0.2 10*3/uL (ref 0.0–0.5)
Eosinophils Relative: 3 %
HCT: 31.6 % — ABNORMAL LOW (ref 39.0–52.0)
Hemoglobin: 10 g/dL — ABNORMAL LOW (ref 13.0–17.0)
Immature Granulocytes: 0 %
Lymphocytes Relative: 22 %
Lymphs Abs: 1.5 10*3/uL (ref 0.7–4.0)
MCH: 30 pg (ref 26.0–34.0)
MCHC: 31.6 g/dL (ref 30.0–36.0)
MCV: 94.9 fL (ref 80.0–100.0)
Monocytes Absolute: 0.6 10*3/uL (ref 0.1–1.0)
Monocytes Relative: 8 %
Neutro Abs: 4.4 10*3/uL (ref 1.7–7.7)
Neutrophils Relative %: 67 %
Platelets: 198 10*3/uL (ref 150–400)
RBC: 3.33 MIL/uL — ABNORMAL LOW (ref 4.22–5.81)
RDW: 16.1 % — ABNORMAL HIGH (ref 11.5–15.5)
WBC: 6.7 10*3/uL (ref 4.0–10.5)
nRBC: 0 % (ref 0.0–0.2)

## 2023-11-24 LAB — BRAIN NATRIURETIC PEPTIDE: B Natriuretic Peptide: 1124 pg/mL — ABNORMAL HIGH (ref 0.0–100.0)

## 2023-11-24 LAB — TROPONIN I (HIGH SENSITIVITY): Troponin I (High Sensitivity): 58 ng/L — ABNORMAL HIGH

## 2023-11-24 MED ORDER — FUROSEMIDE 10 MG/ML IJ SOLN
40.0000 mg | Freq: Once | INTRAMUSCULAR | Status: AC
  Administered 2023-11-24: 40 mg via INTRAVENOUS
  Filled 2023-11-24: qty 4

## 2023-11-24 MED ORDER — INSULIN ASPART 100 UNIT/ML IJ SOLN
0.0000 [IU] | INTRAMUSCULAR | Status: DC
  Administered 2023-11-25 (×2): 2 [IU] via SUBCUTANEOUS
  Administered 2023-11-25: 5 [IU] via SUBCUTANEOUS

## 2023-11-24 NOTE — Subjective & Objective (Addendum)
 2 weeks ago patient was diagnosed with pneumonia has been having shortness of breath and getting worse although complete antibiotic course In emergency department noted to be satting 86 to 88% on room air started on 2 L feel much better on oxygen

## 2023-11-24 NOTE — ED Triage Notes (Signed)
 PT states 2 weeks ago dx with pneumonia from the [place] pt states SOB has been getting worse. Pt completed course of antibiotic.

## 2023-11-24 NOTE — H&P (Signed)
 Jesse Hansen FMW:[id number] DOB: [birth year] DOA: 11/24/2023     PCP: [name] ORN, MD  goes to [facility]    Patient arrived to ER on 11/24/23 at 1412 Referred by Attending Trifan, Donnice PARAS, MD   Patient coming from:    home Lives With family    Chief Complaint:   Chief Complaint  Patient presents with   Shortness of Breath    HPI: Jesse Hansen is a 78 y.o. adult with medical history significant of HTN, agent orange exposure, PVD, heart failure, diabetes, NSTEMI, defibrillator, systolic CHF , CKD 3b, DM2,  COPD, lung ca right sp lobectomy    Presented with  increased SOB and leg edema 2 weeks ago patient was diagnosed with pneumonia has been having shortness of breath and getting worse although complete antibiotic course In emergency department noted to be satting 86 to 88% on room air started on 2 L feel much better on oxygen  Patient states he takes his Lasix  as prescribed has not missed any doses.   Gained 10 lb, leg edema,  Denies any CP No bleeding no blood in stool , no black stools  Reports has been wheezing for the past 5 weeks  Denies significant ETOH intake   Does not smoke   Lab Results  Component Value Date   SARSCOV2NAA NEGATIVE 11/24/2023   SARSCOV2NAA NEGATIVE 06/23/2020       Regarding pertinent Chronic problems:     Hyperlipidemia -  on statins Crestor  Lipid Panel     Component Value Date/Time   CHOL 137 02/06/2022 0248   TRIG 81 02/06/2022 0248   HDL 29 (L) 02/06/2022 0248   CHOLHDL 4.7 02/06/2022 0248   VLDL 16 02/06/2022 0248   LDLCALC 92 02/06/2022 0248     HTN on coreg  6.25MG  bid   chronic CHF  systolic - last echo : 01/03/2018 LVEF 20 to 25%  FUROSEMIDE  20MG   Spironolactone  25mg  po q day LOSARTAN  25MG         DM 2 -  Lab Results  Component Value Date   HGBA1C 6.8 (H) 02/03/2022   on insulin  Glargyne 14 units, glipizide       COPD - not  followed by pulmonology   not  on baseline oxygen          Hx of TIA -  with/out  residual deficits on Aspirin  81 mg,   Plavix      CKD stage IIIb-   baseline Cr 1.5 Estimated Creatinine Clearance (by C-G formula based on SCr of 1.99 mg/dL (H)) Male: 26 mL/min (A) Male: 31.6 mL/min (A)  Lab Results  Component Value Date   CREATININE 1.99 (H) 11/24/2023   CREATININE 1.52 (H) 02/06/2022   CREATININE 1.46 (H) 02/05/2022   Lab Results  Component Value Date   NA 136 11/24/2023   CL 101 11/24/2023   K 4.1 11/24/2023   CO2 21 (L) 11/24/2023   BUN 31 (H) 11/24/2023   CREATININE 1.99 (H) 11/24/2023   GFRNONAA 34 (L) 11/24/2023   CALCIUM  9.1 11/24/2023   ALBUMIN 3.3 (L) 11/24/2023   GLUCOSE 183 (H) 11/24/2023    While in ER:    CXR - mild fluid Trop 58  Lasix  given  Lab Orders         Resp panel by RT-PCR (RSV, Flu A&B, Covid) Anterior Nasal Swab         CBC with Differential         Comprehensive metabolic panel  Brain natriuretic peptide         CBC with Differential/Platelet       CXR - Thickening along the superior aspect of the major fissure on the right which may represent some mild fluid trapped in the fissure. No other focal abnormality is noted.   Following Medications were ordered in ER: Medications  furosemide  (LASIX ) injection 40 mg (has no administration in time range)        ED Triage Vitals  Encounter Vitals Group     BP 11/24/23 1417 113/61     Systolic BP Percentile --      Diastolic BP Percentile --      Pulse Rate 11/24/23 1417 63     Resp 11/24/23 1417 17     Temp 11/24/23 1417 98.1 F (36.7 C)     Temp Source 11/24/23 1838 Oral     SpO2 11/24/23 1417 100 %     Weight 11/24/23 1424 160 lb 15 oz (73 kg)     Height --      Head Circumference --      Peak Flow --      Pain Score 11/24/23 1424 0     Pain Loc --      Pain Education --      Exclude from Growth Chart --   UFJK(75)@     _________________________________________ Significant initial  Findings: Abnormal Labs Reviewed  COMPREHENSIVE METABOLIC PANEL -  Abnormal; Notable for the following components:      Result Value   CO2 21 (*)    Glucose, Bld 183 (*)    BUN 31 (*)    Creatinine, Ser 1.99 (*)    Total Protein 6.2 (*)    Albumin 3.3 (*)    ALT 52 (*)    GFR, Estimated 34 (*)    All other components within normal limits  BRAIN NATRIURETIC PEPTIDE - Abnormal; Notable for the following components:   B Natriuretic Peptide 1,124.0 (*)    All other components within normal limits  CBC WITH DIFFERENTIAL/PLATELET - Abnormal; Notable for the following components:   RBC 3.33 (*)    Hemoglobin 10.0 (*)    HCT 31.6 (*)    RDW 16.1 (*)    All other components within normal limits  TROPONIN I (HIGH SENSITIVITY) - Abnormal; Notable for the following components:   Troponin I (High Sensitivity) 58 (*)    All other components within normal limits    _________________________ Troponin  ordered Cardiac Panel (last 3 results) Recent Labs    11/24/23 2109  TROPONINIHS 58*    ECG: Ordered Personally reviewed and interpreted by me showing: HR : 65 Rhythm: Atrial-sensed ventricular-paced rhythm Abnormal ECG QTC 475  BNP (last 3 results) Recent Labs    11/24/23 1508  BNP 1,124.0*     COVID-19 Labs  No results for input(s): DDIMER, FERRITIN, LDH, CRP in the last 72 hours.  Lab Results  Component Value Date   SARSCOV2NAA NEGATIVE 11/24/2023   SARSCOV2NAA NEGATIVE 06/23/2020      The recent clinical data is shown below. Vitals:   11/24/23 1417 11/24/23 1424 11/24/23 1838 11/24/23 2200  BP: 113/61  96/60 101/63  Pulse: 63  70 71  Resp: 17  18 18   Temp: 98.1 F (36.7 C)  98 F (36.7 C) 98.2 F (36.8 C)  TempSrc:   Oral   SpO2: 100%  100% 99%  Weight:  73 kg      WBC  Component Value Date/Time   WBC 6.7 11/24/2023 2109   LYMPHSABS 1.5 11/24/2023 2109   MONOABS 0.6 11/24/2023 2109   EOSABS 0.2 11/24/2023 2109   BASOSABS 0.0 11/24/2023 2109    Procalcitonin   <0.1      UA ordered     Results for orders  placed or performed during the hospital encounter of 11/24/23  Resp panel by RT-PCR (RSV, Flu A&B, Covid) Anterior Nasal Swab     Status: None   Collection Time: 11/24/23  2:56 PM   Specimen: Anterior Nasal Swab  Result Value Ref Range Status   SARS Coronavirus 2 by RT PCR NEGATIVE NEGATIVE Final   Influenza A by PCR NEGATIVE NEGATIVE Final   Influenza B by PCR NEGATIVE NEGATIVE Final         Resp Syncytial Virus by PCR NEGATIVE NEGATIVE Final          _________________________________________________ Recent Labs  Lab 11/24/23 1500 11/24/23 2332  NA 136  --   K 4.1  --   CO2 21*  --   GLUCOSE 183*  --   BUN 31*  --   CREATININE 1.99*  --   CALCIUM  9.1  --   MG  --  2.2  PHOS  --  3.4    Cr   Up from baseline see below Lab Results  Component Value Date   CREATININE 1.99 (H) 11/24/2023   CREATININE 1.52 (H) 02/06/2022   CREATININE 1.46 (H) 02/05/2022    Recent Labs  Lab 11/24/23 1500  AST 33  ALT 52*  ALKPHOS 62  BILITOT 1.0  PROT 6.2*  ALBUMIN 3.3*   Lab Results  Component Value Date   CALCIUM  9.1 11/24/2023        Plt: Lab Results  Component Value Date   PLT 198 11/24/2023       Recent Labs  Lab 11/24/23 2109  WBC 6.7  NEUTROABS 4.4  HGB 10.0*  HCT 31.6*  MCV 94.9  PLT 198    HG/HCT   Down  from baseline see below    Component Value Date/Time   HGB 10.0 (L) 11/24/2023 2109   HCT 31.6 (L) 11/24/2023 2109   MCV 94.9 11/24/2023 2109      _______________________________________________ Hospitalist was called for admission for   Acute respiratory failure with hypoxia     acute on chronic systolic CHF and COPD component   The following Work up has been ordered so far:  Orders Placed This Encounter  Procedures   Critical Care   Resp panel by RT-PCR (RSV, Flu A&B, Covid) Anterior Nasal Swab   DG Chest 2 View   CBC with Differential   Comprehensive metabolic panel   Brain natriuretic peptide   CBC with Differential/Platelet   Check Pulse  Oximetry while ambulating   Consult to hospitalist   EKG 12-Lead   ED EKG     OTHER Significant initial  Findings:  labs showing:     DM  labs:  HbA1C: No results for input(s): HGBA1C in the last 8760 hours.     CBG (last 3)  No results for input(s): GLUCAP in the last 72 hours.        Cultures:    Component Value Date/Time   SDES  01/03/2018 1414    URINE, CLEAN CATCH Performed at [facility], [address] [place], [place] [address]    SPECREQUEST  01/03/2018 1414    NONE Performed at [facility], [address] [place], [place] [address]    CULT MULTIPLE SPECIES PRESENT, SUGGEST RECOLLECTION (A) 01/03/2018 1414   REPTSTATUS 01/05/2018 FINAL 01/03/2018 1414     Radiological Exams on Admission: DG Chest 2 View Result Date: 11/24/2023 CLINICAL DATA:  Shortness of breath EXAM: CHEST - 2 VIEW COMPARISON:  10/09/2022 FINDINGS: Cardiac shadow is stable. Postoperative changes are seen. Defibrillator is again noted. Focal ovoid airspace opacity is noted in the right mid lung at the superior aspect of the major fissure. This may represent mild fluid trapped in the fissure. No other focal abnormality is noted. IMPRESSION: Thickening along the superior aspect of the major fissure on the right which may represent some mild fluid trapped in the fissure. No other focal abnormality is noted. Electronically Signed   By: Oneil Devonshire M.D.   On: 11/24/2023 17:29   _______________________________________________________________________________________________________ Latest  Blood pressure 101/63, pulse 71, temperature 98.2 F (36.8 C), resp. rate 18, weight 73 kg, SpO2 99%.   Vitals  labs and radiology finding personally reviewed  Review of Systems:    Pertinent positives include:   fatigue,shortness of breath at rest.   dyspnea on exertion,  Bilateral lower extremity swelling productive cough wheezing. Constitutional:  No weight loss,  night sweats, Fevers, chills, weight loss  HEENT:  No headaches, Difficulty swallowing,Tooth/dental problems,Sore throat,  No sneezing, itching, ear ache, nasal congestion, post nasal drip,  Cardio-vascular:  No chest pain, Orthopnea, PND, anasarca, dizziness, palpitations.no  GI:  No heartburn, indigestion, abdominal pain, nausea, vomiting, diarrhea, change in bowel habits, loss of appetite, melena, blood in stool, hematemesis Resp:  no  No excess mucus, no, No non-productive cough, No coughing up of blood.No change in color of mucus.No  Skin:  no rash or lesions. No jaundice GU:  no dysuria, change in color of urine, no urgency or frequency. No straining to urinate.  No flank pain.  Musculoskeletal:  No joint pain or no joint swelling. No decreased range of motion. No back pain.  Psych:  No change in mood or affect. No depression or anxiety. No memory loss.  Neuro: no localizing neurological complaints, no tingling, no weakness, no double vision, no gait abnormality, no slurred speech, no confusion  All systems reviewed and apart from HOPI all are negative _______________________________________________________________________________________________ Past Medical History:   Past Medical History:  Diagnosis Date   Agent orange exposure    Anxiety    Carotid artery disease (HCC)    a. s/p carotid endarterectomy remotely.   Chronic systolic CHF (congestive heart failure) (HCC)    COLONIC POLYPS, HX OF 01/29/2011   Qualifier: Diagnosis of  By: Norleen MD, Lynwood ORN    Coronary artery disease involving native coronary artery with angina pectoris [facility])    a. s/p Multiple PCIs. b. CABG x 3 in 2013 (SVG-OM1, SVG-Diag with freeLIMA-LAD from SVG-Diag hood) --> relook cath in ~2015 with at least 1 graft ~occluded by report.   Diabetes mellitus with circulatory complication [facility])    ERECTILE DYSFUNCTION 08/15/2007   Qualifier: Diagnosis of  By: Dance CMA (AAMA), Kim     Essential hypertension     Hyperlipidemia    Insomnia    LBBB (left bundle branch block)    Pneumonia    PTSD 07/30/2010   Qualifier: Diagnosis of  By: Norleen MD, Lynwood ORN    PVD (peripheral vascular disease) (HCC)    a. s/p AOBF and LCE in the 1990s.     Past Surgical History:  Procedure Laterality Date  ABDOMINAL AORTIC ANEURYSM REPAIR  1996?   Done at [facility] by Dr. Oris   CAROTID ENDARTERECTOMY Left    Dr. Darron at [facility] in [place]    CAROTID PTA/STENT INTERVENTION Left 03/24/2017   Procedure: Carotid PTA/Stent Intervention;  Surgeon: Gaile LELON New, MD;  Location: [facility] INVASIVE CV LAB;  Service: Cardiovascular;  Laterality: Left;   CAROTID-SUBCLAVIAN BYPASS GRAFT Left 02/05/2022   Procedure: LEFT CAROTID-SUBCLAVIAN BYPASS GRAFT;  Surgeon: New Gaile LELON, MD;  Location: [facility] OR;  Service: Vascular;  Laterality: Left;   CORONARY ARTERY BYPASS GRAFT  2013   @ [facility] [place], [place] (b/c [facility] OR was contaminated) -- SVG-Diag with freeLIMA-LAD sewn to SVG hood, SVG-OM1   CORONARY STENT PLACEMENT  2007   LEFT HEART CATH AND CORS/GRAFTS ANGIOGRAPHY N/A 01/05/2018   Procedure: LEFT HEART CATH AND CORS/GRAFTS ANGIOGRAPHY;  Surgeon: Jordan, Peter M, MD;  Location: [facility] INVASIVE CV LAB;  Service: Cardiovascular;  Laterality: N/A;   PROSTATE BIOPSY  2009    Social History:  Ambulatory   independently      reports that he has been smoking cigarettes. He has never been exposed to tobacco smoke. He has never used smokeless tobacco. He reports that he does not currently use alcohol . He reports that he does not use drugs.     Family History:   Family History  Problem Relation Age of Onset   Hypertension Father    ______________________________________________________________________________________________ Allergies: Allergies  Allergen Reactions   Oxycodone -Acetaminophen  Shortness Of Breath and Nausea And Vomiting    Patient tolerates Tylenol    Codeine Nausea And Vomiting and Itching     Other reaction(s): Paresthesia, Finding of vomiting   Jardiance  [Empagliflozin ] Diarrhea and Nausea And Vomiting   Nicotine      Nicotine  Patch--Per Nurse heart issues   Niacin Other (See Comments)    unknown     Prior to Admission medications   Medication Sig Start Date End Date Taking? Authorizing Provider  acetaminophen  (TYLENOL ) 500 MG tablet Take 500-1,000 mg by mouth every 6 (six) hours as needed (for pain.).   Yes [provider]  albuterol  (VENTOLIN  HFA) 108 (90 Base) MCG/ACT inhaler Inhale 2 puffs into the lungs 2 (two) times daily.   Yes [provider]  amiodarone (PACERONE) 200 MG tablet Take 200 mg by mouth daily.   Yes [provider]  aspirin  EC 81 MG tablet Take 1 tablet (81 mg total) by mouth daily. Patient taking differently: Take 81 mg by mouth every evening. 05/15/14  Yes [name] LELON, MD  carboxymethylcellulose (REFRESH PLUS) 0.5 % SOLN Place 1 drop into both eyes 3 (three) times daily as needed (dry/irritated eyes). 03/26/21  Yes [provider]  carvedilol  (COREG ) 6.25 MG tablet Take 6.25 mg by mouth 2 (two) times daily with a meal.   Yes [provider]  Cholecalciferol  (VITAMIN D3) 50 MCG (2000 UT) TABS Take 50 mcg by mouth every morning.   Yes [provider]  clopidogrel  (PLAVIX ) 75 MG tablet Take 75 mg by mouth daily.   Yes [provider]  Coenzyme Q10 (COQ10) 50 MG CAPS Take 50 mg by mouth every morning.   Yes [provider]  furosemide  (LASIX ) 40 MG tablet Take 1 tablet (40 mg total) by mouth daily. 06/24/20  Yes McDaniel, Jill D, NP  glipiZIDE  (GLUCOTROL ) 5 MG tablet Take 7.5 mg by mouth 2 (two) times daily before a meal. Take 30 minutes prior to meals.   Yes [provider]  ipratropium (ATROVENT ) 0.03 % nasal spray Place 2 sprays into both nostrils 2 (two) times daily as needed for rhinitis.   Yes [provider]  LORazepam  (ATIVAN ) 1 MG tablet TAKE ONE (1) TABLET BY MOUTH  TWO (2) TIMES DAILY AS NEEDED Patient taking differently: Take 2 mg by mouth at bedtime. 09/19/23  Yes [name] ORN, MD  losartan  (COZAAR ) 25 MG tablet Take 25 mg by mouth daily. 10/09/23  Yes [provider]  nitroGLYCERIN  (NITROSTAT ) 0.4 MG SL tablet Place 0.4 mg under the tongue every 5 (five) minutes x 3 doses as needed for chest pain.   Yes [provider]  rosuvastatin  (CRESTOR ) 40 MG tablet Take 40 mg by mouth every evening.   Yes [provider]  spironolactone  (ALDACTONE ) 25 MG tablet Take 25 mg by mouth daily. TAKE ONE TABLET BY MOUTH DAILY FOR CARDIOMYOPATHY - STOP POTASSIUM SUPPLEMENT. FOLLOW UP RENAL PANEL 5-10 DAYS AFTER STARTING SPIRONOLACTONE . FOR CARDIOMYOPATHY - STOP POTASSIUM SUPPLEMENT. FOLLOW UP RENAL PANEL 5-10 DAYS AFTER STARTING SPIRONOLACTONE . 10/09/23  Yes [provider]  vitamin C (ASCORBIC ACID) 500 MG tablet Take 500 mg by mouth every morning.   Yes [provider]  ammonium lactate (LAC-HYDRIN) 12 % lotion Apply 1 application. topically as needed for dry skin. 09/17/20   [provider]    ___________________________________________________________________________________________________ Physical Exam:    11/24/2023   10:00 PM 11/24/2023    6:38 PM 11/24/2023    2:24 PM  Vitals with BMI  Weight   160 lbs 15 oz  Systolic 101 96   Diastolic 63 60   Pulse 71 70      1. General:  in No  Acute distress   Chronically ill  -appearing 2. Psychological: Alert and   Oriented 3. Head/ENT:   Moist Mucous Membranes                          Head Non traumatic, neck supple                            Poor Dentition 4. SKIN: normal  Skin turgor,  Skin clean Dry and intact no rash    5. Heart: Regular rate and rhythm no  Murmur, no Rub or gallop 6. Lungs: occasional wheezes   crackles   7. Abdomen: Soft,  non-tender, Non distended  bowel sounds present 8. Lower extremities: no clubbing, cyanosis, 1+ edema 9.  Neurologically Grossly intact, moving all 4 extremities equally   10. MSK: Normal range of motion    Chart has been reviewed  ______________________________________________________________________________________________  Assessment/Plan 78 y.o. adult with medical history significant of HTN, agent orange exposure, PVD, heart failure, diabetes, NSTEMI, defibrillator, systolic CHF , CKD 3b, DM2,   Admitted for Acute respiratory failure with hypoxia     acute on chronic systolic CHF and component of possible COPD exacerbation   Present on Admission:  Acute on chronic systolic CHF (congestive heart failure) (HCC)  Chronic kidney disease, stage 3b (HCC)  Elevated troponin  Essential hypertension  COPD with acute exacerbation (HCC)  Normocytic anemia  Hyperlipidemia  Multiple nodules of lung  Presence of cardiac resynchronization therapy defibrillator (CRT-D)     Acute on chronic systolic CHF (congestive heart failure) (HCC) - Pt diagnosed with CHF based on presence of the following:  PND, OA, rales on exam,     bilateral leg edema, DOE,  With noted response to IV diuretic in ER  admit on telemetry,  cycle cardiac enzymes, Cardiac Panel (last 3 results) Recent Labs    11/24/23 2109  TROPONINIHS 58*     obtain serial ECG  to evaluate for ischemia as a cause of heart failure  monitor daily weight:  Filed Weights   11/24/23 1424 11/24/23 2342  Weight: 73 kg 73 kg   Last BNP BNP (last 3 results) Recent Labs    11/24/23 1508  BNP 1,124.0*       diurese with IV lasix    40 mg IV  Daily and monitor orthostatics and creatinine to avoid over diuresis.  Order echogram to evaluate EF and valves  ACE/ARBi  Contraindicated    Cardiology emailed   Chronic kidney disease, stage 3b (HCC)  -chronic avoid nephrotoxic medications such as NSAIDs, Vanco Zosyn combo,  avoid hypotension, continue to follow renal function   Diabetes mellitus (HCC)  - Order Sensitive  SSI   -  continue home insulin  but decreased to  10 units,  -  check TSH and HgA1C  - Hold by mouth medications    Elevated troponin No CP in the setting of CHF, hypoxia Stable  Echo in AM No ECG changes  Essential hypertension Soft pb allow permissive htn   COPD with acute exacerbation (HCC)  - noted increased wheezing, cough COPD could be contributing to the symptoms   - Will initiate: Steroid taper  -  Antibiotics doxycycline  - Albuterol   PRN, - scheduled duoneb,    -  Mucinex .  Titrate O2 to saturation >90%. Follow patients respiratory status.  Order  VBG     Currently mentating well no evidence of symptomatic hypercarbia   Normocytic anemia Obtain anemia panel  Transfuse for Hg <7 , rapidly dropping or  if symptomatic Hemoccult stool given decreased in hg   Hyperlipidemia Continue Crestor  40 mg a day  Multiple nodules of lung Patient was diagnosed in the past with lung cancer s/p resection. Given was prior any respiratory status obtain CT without contrast to further evaluate  Presence of cardiac resynchronization therapy defibrillator (CRT-D) Chronic stable  History of stroke Continue Plavix  and statin Crestor  40 mg  Other plan as per orders.  DVT prophylaxis:  SCD     Code Status:    Code Status: Prior FULL CODE  as per patient   I had personally discussed CODE STATUS with patient   ACP  has been reviewed     Family Communication:   Family not at  Bedside    Diet  diabetic diet   Disposition Plan:         To home once workup is complete and patient is stable   Following barriers for discharge:                                                            Anemia  stable                                                          Will need to be able to tolerate PO  Will likely need home health, home O2, set up                           Will need consultants to evaluate patient prior to discharge       Consult Orders  (From  admission, onward)           Start     Ordered   11/24/23 2206  Consult to hospitalist  Once       Provider:  (Not yet assigned)  Question Answer Comment  Place call to: [facility]   Reason for Consult Admit      11/24/23 2206                               Would benefit from PT/OT eval prior to DC  Ordered                                     Transition of care consulted             Consults called:    will email cardiology    Admission status:  ED Disposition     ED Disposition  Admit   Condition  --   Comment  Hospital Area: [facility] [[id number]]  Level of Care: Progressive [102]  Admit to Progressive based on following criteria: CARDIOVASCULAR & THORACIC of moderate stability with acute coronary syndrome symptoms/low risk myocardial infarction/hypertensive urgency/arrhythmias/heart failure potentially compromising stability and stable post cardiovascular intervention patients.  May admit patient to [facility] or [facility] if equivalent level of care is available:: No  Covid Evaluation: Asymptomatic - no recent exposure (last 10 days) testing not required  Diagnosis: Acute on chronic systolic CHF (congestive heart failure) [facility]) [[id number]]  Admitting Physician: Vernessa Likes [[id number]]  Attending Physician: Kaliopi Blyden [[id number]]  Certification:: I certify this patient will need inpatient services for at least 2 midnights  Expected Medical Readiness: 11/27/2023            inpatient     I Expect 2 midnight stay secondary to severity of patient's current illness need for inpatient interventions justified by the following:  hemodynamic instability despite optimal treatment ( hypotension  hypoxia,  )  Severe lab/radiological/exam abnormalities including:    Elevated troponin and BNP and extensive comorbidities including:  DM2   CHF  CAD CKD    That are currently affecting medical management.   I expect  patient to be hospitalized  for 2 midnights requiring inpatient medical care.  Patient is at high risk for adverse outcome (such as loss of life or disability) if not treated.  Indication for inpatient stay as follows:    New or worsening hypoxia   Need for  IV diuretics    Level of care           progressive      Lab Results  Component Value Date   SARSCOV2NAA NEGATIVE 11/24/2023    Rachel Rison 11/24/2023, 1:23 AM    [facility]     after 2 AM please page floor coverage PA If 7AM-7PM, please contact the day team taking care of the patient using [email or web address]

## 2023-11-24 NOTE — ED Provider Notes (Signed)
 [place] [facility] DEPARTMENT AT [facility] Provider Note   CSN: [id number] Arrival date & time: 11/24/23  1412     History  Chief Complaint  Patient presents with   Shortness of Breath    Jesse Hansen is a 78 y.o. adult history of hypertension, agent orange exposure, PVD, heart failure, diabetes, NSTEMI, defibrillator, CHF presented for shortness of breath for the past 2 weeks.  Patient goes to the [place] and states he was diagnosed with pneumonia as been taking Augmentin to no relief along with steroids.  Patient states he is having a nonproductive cough and that his legs are swelling.  Patient does endorse 10 pound weight gain in this time.  Patient denies chest pain but does state that he is having shortness of breath on exertion.  Patient states he takes his Lasix  as prescribed has not missed any doses.  Patient denies fevers, sick contacts, abdominal pain, blood thinners  Last echo: 01/03/2018 LVEF 20 to 25%  Home Medications Prior to Admission medications   Medication Sig Start Date End Date Taking? Authorizing Provider  acetaminophen  (TYLENOL ) 500 MG tablet Take 500-1,000 mg by mouth every 6 (six) hours as needed (for pain.).   Yes [provider]  albuterol  (VENTOLIN  HFA) 108 (90 Base) MCG/ACT inhaler Inhale 2 puffs into the lungs 2 (two) times daily.   Yes [provider]  amiodarone (PACERONE) 200 MG tablet Take 200 mg by mouth daily.   Yes [provider]  aspirin  EC 81 MG tablet Take 1 tablet (81 mg total) by mouth daily. Patient taking differently: Take 81 mg by mouth every evening. 05/15/14  Yes [name] ORN, MD  carboxymethylcellulose (REFRESH PLUS) 0.5 % SOLN Place 1 drop into both eyes 3 (three) times daily as needed (dry/irritated eyes). 03/26/21  Yes [provider]  carvedilol  (COREG ) 6.25 MG tablet Take 6.25 mg by mouth 2 (two) times daily with a meal.   Yes [provider]  Cholecalciferol  (VITAMIN D3) 50 MCG  (2000 UT) TABS Take 50 mcg by mouth every morning.   Yes [provider]  clopidogrel  (PLAVIX ) 75 MG tablet Take 75 mg by mouth daily.   Yes [provider]  Coenzyme Q10 (COQ10) 50 MG CAPS Take 50 mg by mouth every morning.   Yes [provider]  furosemide  (LASIX ) 40 MG tablet Take 1 tablet (40 mg total) by mouth daily. 06/24/20  Yes McDaniel, Jill D, NP  glipiZIDE  (GLUCOTROL ) 5 MG tablet Take 7.5 mg by mouth 2 (two) times daily before a meal. Take 30 minutes prior to meals.   Yes [provider]  insulin  glargine-yfgn (SEMGLEE) 100 UNIT/ML Pen Inject 20 Units into the skin every evening.   Yes [provider]  ipratropium (ATROVENT ) 0.03 % nasal spray Place 2 sprays into both nostrils 2 (two) times daily as needed for rhinitis.   Yes [provider]  LORazepam  (ATIVAN ) 1 MG tablet TAKE ONE (1) TABLET BY MOUTH TWO (2) TIMES DAILY AS NEEDED Patient taking differently: Take 2 mg by mouth at bedtime. 09/19/23  Yes [name] ORN, MD  losartan  (COZAAR ) 25 MG tablet Take 25 mg by mouth daily. 10/09/23  Yes [provider]  nitroGLYCERIN  (NITROSTAT ) 0.4 MG SL tablet Place 0.4 mg under the tongue every 5 (five) minutes x 3 doses as needed for chest pain.   Yes [provider]  rosuvastatin  (CRESTOR ) 40 MG tablet Take 40 mg by mouth every evening.   Yes  [provider]  spironolactone  (ALDACTONE ) 25 MG tablet Take 25 mg by mouth daily. TAKE ONE TABLET BY MOUTH DAILY FOR CARDIOMYOPATHY - STOP POTASSIUM SUPPLEMENT. FOLLOW UP RENAL PANEL 5-10 DAYS AFTER STARTING SPIRONOLACTONE . FOR CARDIOMYOPATHY - STOP POTASSIUM SUPPLEMENT. FOLLOW UP RENAL PANEL 5-10 DAYS AFTER STARTING SPIRONOLACTONE . 10/09/23  Yes [provider]  vitamin C (ASCORBIC ACID) 500 MG tablet Take 500 mg by mouth every morning.   Yes [provider]  ammonium lactate (LAC-HYDRIN) 12 % lotion Apply 1 application. topically as needed for dry skin.  09/17/20   [provider]      Allergies    Oxycodone -acetaminophen , Codeine, Jardiance  [empagliflozin ], Nicotine , and Niacin    Review of Systems   Review of Systems  Respiratory:  Positive for shortness of breath.     Physical Exam Updated Vital Signs BP 101/63   Pulse 71   Temp 98.2 F (36.8 C)   Resp 18   Wt 73 kg   SpO2 99%   BMI 22.45 kg/m  Physical Exam Constitutional:      General: He is not in acute distress. Cardiovascular:     Rate and Rhythm: Normal rate and regular rhythm.     Pulses: Normal pulses.     Heart sounds: Normal heart sounds.  Pulmonary:     Effort: Pulmonary effort is normal. No respiratory distress.     Breath sounds: Normal breath sounds.     Comments: Able to speak in full sentences Musculoskeletal:     Comments: 2+ bilateral pitting edema  Skin:    General: Skin is warm and dry.  Neurological:     Mental Status: He is alert.  Psychiatric:        Mood and Affect: Mood normal.     ED Results / Procedures / Treatments   Labs (all labs ordered are listed, but only abnormal results are displayed) Labs Reviewed  COMPREHENSIVE METABOLIC PANEL - Abnormal; Notable for the following components:      Result Value   CO2 21 (*)    Glucose, Bld 183 (*)    BUN 31 (*)    Creatinine, Ser 1.99 (*)    Total Protein 6.2 (*)    Albumin 3.3 (*)    ALT 52 (*)    GFR, Estimated 34 (*)    All other components within normal limits  BRAIN NATRIURETIC PEPTIDE - Abnormal; Notable for the following components:   B Natriuretic Peptide 1,124.0 (*)    All other components within normal limits  CBC WITH DIFFERENTIAL/PLATELET - Abnormal; Notable for the following components:   RBC 3.33 (*)    Hemoglobin 10.0 (*)    HCT 31.6 (*)    RDW 16.1 (*)    All other components within normal limits  TROPONIN I (HIGH SENSITIVITY) - Abnormal; Notable for the following components:   Troponin I (High Sensitivity) 58 (*)    All other components within normal  limits  RESP PANEL BY RT-PCR (RSV, FLU A&B, COVID)  [id number]  CBC WITH DIFFERENTIAL/PLATELET  TROPONIN I (HIGH SENSITIVITY)    EKG EKG Interpretation Date/Time:  Thursday November 24 2023 14:24:52 EST Ventricular Rate:  65 PR Interval:  168 QRS Duration:  160 QT Interval:  440 QTC Calculation: 457 R Axis:   -44  Text Interpretation: Atrial-sensed ventricular-paced rhythm Abnormal ECG When compared with ECG of 03-Feb-2022 13:17, PREVIOUS ECG IS PRESENT Confirmed by Cottie Cough [phone]) on 11/24/2023 9:18:26 PM  Radiology DG Chest 2 View Result Date: 11/24/2023  CLINICAL DATA:  Shortness of breath EXAM: CHEST - 2 VIEW COMPARISON:  10/09/2022 FINDINGS: Cardiac shadow is stable. Postoperative changes are seen. Defibrillator is again noted. Focal ovoid airspace opacity is noted in the right mid lung at the superior aspect of the major fissure. This may represent mild fluid trapped in the fissure. No other focal abnormality is noted. IMPRESSION: Thickening along the superior aspect of the major fissure on the right which may represent some mild fluid trapped in the fissure. No other focal abnormality is noted. Electronically Signed   By: Oneil Devonshire M.D.   On: 11/24/2023 17:29    Procedures .Critical Care  Performed by: Victor Lynwood DASEN, PA-C Authorized by: Victor Lynwood DASEN, PA-C   Critical care provider statement:    Critical care time (minutes):  30   Critical care was necessary to treat or prevent imminent or life-threatening deterioration of the following conditions:  Respiratory failure   Critical care was time spent personally by me on the following activities:  Blood draw for specimens, development of treatment plan with patient or surrogate, discussions with consultants, evaluation of patient's response to treatment, examination of patient, obtaining history from patient or surrogate, review of old charts, re-evaluation of patient's condition, pulse oximetry, ordering and review of  laboratory studies, ordering and review of radiographic studies and ordering and performing treatments and interventions   I assumed direction of critical care for this patient from another provider in my specialty: no     Care discussed with: admitting provider       Medications Ordered in ED Medications  furosemide  (LASIX ) injection 40 mg (has no administration in time range)    ED Course/ Medical Decision Making/ A&P                                 Medical Decision Making Amount and/or Complexity of Data Reviewed Labs: ordered. Radiology: ordered.  Risk Prescription drug management. Decision regarding hospitalization.   Lamar PARAS Martire 78 y.o. presented today for shortness of breath.  Working DDx that I considered at this time includes, but not limited to, CHF, asthma/COPD exacerbation, URI, viral illness, anemia, ACS, PE, pneumonia, pleural effusion, lung cancer.  R/o DDx: asthma/COPD exacerbation, URI, viral illness, anemia, ACS, PE, pneumonia, lung cancer: These are considered less likely due to history of present illness, physical exam, labs/imaging findings  Review of prior external notes: 1-25 unknown  Unique Tests and My Interpretation:  CBC: Unremarkable CMP: Slightly increased creatinine from 1.5-1.9 EKG: Sinus 65 bpm with atrial paced rhythm, no ST elevations or depressions indicative of ischemia Troponin: 58 CXR: Fluid in major fissure Respiratory Panel: Negative  Social Determinants of Health: none  Discussion with Independent Historian:  Wife  Discussion of Management of Tests:  Doutova, MD Hospitalist  Risk: High: hospitalization or escalation of hospital-level care  Risk Stratification Score: None  Staffed with Trifan, MD  Plan: On exam patient was in no acute distress with stable vitals.  Patient did have 2+ bilateral pitting edema on exam but otherwise had reassuring physical exam.  Labs in triage do show a BNP of 1124 and with his leg swelling  and dyspnea on exertion along with his history and physical exam do feel that he is having CHF exacerbation.  Will add on troponin however do anticipate admission as patient has been taking his Lasix  at home to be diuresed.  Will ambulate the patient as well.  Patient was ambulated only made a 10 feet before dropping down to 90% unable to continue.  Patient is placed on 2 L nasal cannula which is new as patient does not normally require oxygen.  Pending troponin will admit patient for respiratory failure secondary to CHF exacerbation.  Patient's troponin came back at 58 which is slightly up from baseline of 40 however he is not endorsing chest pain we have an alternative diagnosis at this time.  Will consult hospitalist for admission.  Hospitalist was consulted and accepted patient for admission.  Patient stable for admission.  This chart was dictated using voice recognition software.  Despite [place] efforts to proofread,  errors can occur which can change the documentation meaning.         Final Clinical Impression(s) / ED Diagnoses Final diagnoses:  Acute respiratory failure with hypoxia (HCC)  Congestive heart failure, unspecified HF chronicity, unspecified heart failure type [facility])    Rx / DC Orders ED Discharge Orders     None         Victor Lynwood ONEIDA DEVONNA 11/24/23 2328    Cottie Donnice PARAS, MD 11/25/23 1434

## 2023-11-24 NOTE — ED Notes (Addendum)
 PT walked about 10-15 ft before starting to get winded and feeling like he was about to pass out. PT was then walked back to the room and placed on the 02 monitor, O2 stats were 86-88%, PT was placed on 2L nasal canula and stats came up to 100%. PT states that he is feeling much better with the oxygen on.

## 2023-11-24 NOTE — ED Provider Triage Note (Signed)
 mergency Medicine Provider Triage Evaluation Note  Jesse Hansen , a 78 y.o. adult  was evaluated in triage.  Pt complains of shortness of breath and productive cough.  Treated for pneumonia 2 weeks ago with temporary improvement.  Symptoms have recurred in the past 3 days.  No chest pain.  Review of Systems  Positive:  Negative:   Physical Exam  BP 113/61   Pulse 63   Temp 98.1 F (36.7 C)   Resp 17   Wt 73 kg   SpO2 100%   BMI 22.45 kg/m  Gen:   Awake, no distress   Resp:  Normal effort  MSK:   Moves extremities without difficulty, bilateral 1+ pitting edema Other:    Medical Decision Making  Medically screening exam initiated at 2:35 PM.  Appropriate orders placed.  Jesse Hansen was informed that the remainder of the evaluation will be completed by another provider, this initial triage assessment does not replace that evaluation, and the importance of remaining in the ED until their evaluation is complete.     Donnajean Lynwood DEL, PA-C 11/24/23 1444

## 2023-11-25 ENCOUNTER — Inpatient Hospital Stay (HOSPITAL_COMMUNITY): Payer: No Typology Code available for payment source

## 2023-11-25 DIAGNOSIS — D649 Anemia, unspecified: Secondary | ICD-10-CM | POA: Diagnosis present

## 2023-11-25 DIAGNOSIS — I5023 Acute on chronic systolic (congestive) heart failure: Secondary | ICD-10-CM

## 2023-11-25 DIAGNOSIS — Z8673 Personal history of transient ischemic attack (TIA), and cerebral infarction without residual deficits: Secondary | ICD-10-CM

## 2023-11-25 LAB — PHOSPHORUS: Phosphorus: 3.4 mg/dL (ref 2.5–4.6)

## 2023-11-25 LAB — CBG MONITORING, ED
Glucose-Capillary: 177 mg/dL — ABNORMAL HIGH (ref 70–99)
Glucose-Capillary: 203 mg/dL — ABNORMAL HIGH (ref 70–99)
Glucose-Capillary: 154 mg/dL — ABNORMAL HIGH (ref 70–99)
Glucose-Capillary: 161 mg/dL — ABNORMAL HIGH (ref 70–99)
Glucose-Capillary: 252 mg/dL — ABNORMAL HIGH (ref 70–99)

## 2023-11-25 LAB — BLOOD GAS, VENOUS
Acid-base deficit: 0.8 mmol/L (ref 0.0–2.0)
Bicarbonate: 24.3 mmol/L (ref 20.0–28.0)
O2 Saturation: 72.9 %
Patient temperature: 37
pCO2, Ven: 41 mm[Hg] — ABNORMAL LOW (ref 44–60)
pH, Ven: 7.38 (ref 7.25–7.43)
pO2, Ven: 43 mm[Hg] (ref 32–45)

## 2023-11-25 LAB — FOLATE: Folate: 19.9 ng/mL (ref >5.9)

## 2023-11-25 LAB — HEMOGLOBIN A1C
Hgb A1c MFr Bld: 8 % — ABNORMAL HIGH (ref 4.8–5.6)
Mean Plasma Glucose: 183 mg/dL

## 2023-11-25 LAB — TROPONIN I (HIGH SENSITIVITY)
Troponin I (High Sensitivity): 52 ng/L — ABNORMAL HIGH (ref <18)
Troponin I (High Sensitivity): 53 ng/L — ABNORMAL HIGH (ref <18)
Troponin I (High Sensitivity): 56 ng/L — ABNORMAL HIGH (ref <18)

## 2023-11-25 LAB — OSMOLALITY, URINE: Osmolality, Ur: 403 mosm/kg (ref 300–900)

## 2023-11-25 LAB — GLUCOSE, CAPILLARY
Glucose-Capillary: 208 mg/dL — ABNORMAL HIGH (ref 70–99)
Glucose-Capillary: 185 mg/dL — ABNORMAL HIGH (ref 70–99)

## 2023-11-25 LAB — SODIUM, URINE, RANDOM: Sodium, Ur: 112 mmol/L

## 2023-11-25 LAB — LACTIC ACID, PLASMA
Lactic Acid, Venous: 1.9 mmol/L (ref 0.5–1.9)
Lactic Acid, Venous: 1.2 mmol/L (ref 0.5–1.9)

## 2023-11-25 LAB — RAPID URINE DRUG SCREEN, HOSP PERFORMED
Amphetamines: NOT DETECTED
Barbiturates: NOT DETECTED
Benzodiazepines: NOT DETECTED
Cocaine: NOT DETECTED
Opiates: NOT DETECTED
Tetrahydrocannabinol: NOT DETECTED

## 2023-11-25 LAB — IRON AND TIBC
Iron: 50 ug/dL (ref 45–182)
Saturation Ratios: 17 % — ABNORMAL LOW (ref 17.9–39.5)
TIBC: 295 ug/dL (ref 250–450)
UIBC: 245 ug/dL

## 2023-11-25 LAB — RETICULOCYTES
Immature Retic Fract: 16.8 % — ABNORMAL HIGH (ref 2.3–15.9)
RBC.: 3.31 MIL/uL — ABNORMAL LOW (ref 4.22–5.81)
Retic Count, Absolute: 71.8 10*3/uL (ref 19.0–186.0)
Retic Ct Pct: 2.2 % (ref 0.4–3.1)

## 2023-11-25 LAB — MAGNESIUM: Magnesium: 2.2 mg/dL (ref 1.7–2.4)

## 2023-11-25 LAB — OSMOLALITY: Osmolality: 317 mosm/kg — ABNORMAL HIGH (ref 275–295)

## 2023-11-25 LAB — VITAMIN B12: Vitamin B-12: 736 pg/mL (ref 180–914)

## 2023-11-25 LAB — TSH: TSH: 3.207 u[IU]/mL (ref 0.350–4.500)

## 2023-11-25 LAB — ETHANOL: Alcohol, Ethyl (B): <10 mg/dL (ref <10)

## 2023-11-25 LAB — PREALBUMIN: Prealbumin: 20 mg/dL (ref 18–38)

## 2023-11-25 LAB — PROCALCITONIN: Procalcitonin: <0.1 ng/mL

## 2023-11-25 LAB — FERRITIN: Ferritin: 125 ng/mL (ref 24–336)

## 2023-11-25 LAB — CK: Total CK: 91 U/L (ref 49–397)

## 2023-11-25 MED ORDER — DM-GUAIFENESIN ER 30-600 MG PO TB12
1.0000 | ORAL_TABLET | Freq: Two times a day (BID) | ORAL | Status: DC
  Administered 2023-11-25 – 2023-11-27 (×6): 1 via ORAL
  Filled 2023-11-25 (×7): qty 1

## 2023-11-25 MED ORDER — ENOXAPARIN SODIUM 40 MG/0.4ML IJ SOSY
40.0000 mg | PREFILLED_SYRINGE | INTRAMUSCULAR | Status: DC
  Administered 2023-11-25 – 2023-11-26 (×2): 40 mg via SUBCUTANEOUS
  Filled 2023-11-25 (×2): qty 0.4

## 2023-11-25 MED ORDER — SODIUM CHLORIDE 0.9 % IV SOLN: 500.0000 mg | INTRAVENOUS | Status: DC

## 2023-11-25 MED ORDER — ALUM & MAG HYDROXIDE-SIMETH 200-200-20 MG/5ML PO SUSP
15.0000 mL | ORAL | Status: DC | PRN
  Administered 2023-11-25 – 2023-11-26 (×3): 15 mL via ORAL
  Filled 2023-11-25 (×3): qty 30

## 2023-11-25 MED ORDER — INSULIN ASPART 100 UNIT/ML IJ SOLN
0.0000 [IU] | Freq: Three times a day (TID) | INTRAMUSCULAR | Status: DC
  Administered 2023-11-25: 3 [IU] via SUBCUTANEOUS
  Administered 2023-11-26: 1 [IU] via SUBCUTANEOUS
  Administered 2023-11-26: 5 [IU] via SUBCUTANEOUS
  Administered 2023-11-26: 3 [IU] via SUBCUTANEOUS
  Administered 2023-11-27: 2 [IU] via SUBCUTANEOUS
  Administered 2023-11-27: 7 [IU] via SUBCUTANEOUS

## 2023-11-25 MED ORDER — FUROSEMIDE 10 MG/ML IJ SOLN
40.0000 mg | Freq: Two times a day (BID) | INTRAMUSCULAR | Status: DC
  Administered 2023-11-25 – 2023-11-26 (×3): 40 mg via INTRAVENOUS
  Filled 2023-11-25 (×4): qty 4

## 2023-11-25 MED ORDER — FUROSEMIDE 10 MG/ML IJ SOLN
20.0000 mg | Freq: Every day | INTRAMUSCULAR | Status: DC
  Administered 2023-11-25: 20 mg via INTRAVENOUS
  Filled 2023-11-25: qty 2

## 2023-11-25 MED ORDER — AZITHROMYCIN 250 MG PO TABS: 500.0000 mg | ORAL_TABLET | ORAL | Status: DC

## 2023-11-25 MED ORDER — METHYLPREDNISOLONE SODIUM SUCC 40 MG IJ SOLR
40.0000 mg | Freq: Every day | INTRAMUSCULAR | Status: AC
Start: 2023-11-25 — End: 2023-11-25
  Administered 2023-11-25: 40 mg via INTRAVENOUS
  Filled 2023-11-25: qty 1

## 2023-11-25 MED ORDER — ALBUTEROL SULFATE (2.5 MG/3ML) 0.083% IN NEBU: 2.5000 mg | INHALATION_SOLUTION | RESPIRATORY_TRACT | Status: DC | PRN

## 2023-11-25 MED ORDER — PREDNISONE 20 MG PO TABS: 40.0000 mg | ORAL_TABLET | Freq: Every day | ORAL | Status: DC

## 2023-11-25 MED ORDER — SODIUM CHLORIDE 0.9% FLUSH
3.0000 mL | Freq: Two times a day (BID) | INTRAVENOUS | Status: DC
  Administered 2023-11-25 – 2023-11-27 (×6): 3 mL via INTRAVENOUS

## 2023-11-25 MED ORDER — PREDNISONE 20 MG PO TABS: 20.0000 mg | ORAL_TABLET | Freq: Every day | ORAL | Status: DC

## 2023-11-25 MED ORDER — ALPRAZOLAM 0.5 MG PO TABS
1.0000 mg | ORAL_TABLET | Freq: Two times a day (BID) | ORAL | Status: DC | PRN
  Administered 2023-11-25 – 2023-11-26 (×2): 1 mg via ORAL
  Filled 2023-11-25 (×2): qty 2

## 2023-11-25 MED ORDER — ALPRAZOLAM 0.5 MG PO TABS: 0.5000 mg | ORAL_TABLET | Freq: Every evening | ORAL | Status: DC | PRN

## 2023-11-25 MED ORDER — IPRATROPIUM-ALBUTEROL 0.5-2.5 (3) MG/3ML IN SOLN
3.0000 mL | Freq: Four times a day (QID) | RESPIRATORY_TRACT | Status: DC
  Administered 2023-11-25 – 2023-11-26 (×4): 3 mL via RESPIRATORY_TRACT
  Filled 2023-11-25 (×4): qty 3

## 2023-11-25 MED ORDER — DOXYCYCLINE HYCLATE 100 MG IV SOLR
100.0000 mg | Freq: Two times a day (BID) | INTRAVENOUS | Status: DC
  Administered 2023-11-25: 100 mg via INTRAVENOUS
  Filled 2023-11-25: qty 100

## 2023-11-25 MED ORDER — BUDESONIDE 0.5 MG/2ML IN SUSP
2.0000 mg | Freq: Two times a day (BID) | RESPIRATORY_TRACT | Status: DC
  Administered 2023-11-25 – 2023-11-26 (×5): 2 mg via RESPIRATORY_TRACT
  Filled 2023-11-25 (×7): qty 8

## 2023-11-25 NOTE — Progress Notes (Signed)
   11/25/23 1624  Vitals  Temp 98.4 F (36.9 C)  Temp Source Oral  BP 125/85  MAP (mmHg) 93  BP Location Right Arm  BP Method Automatic  Patient Position (if appropriate) Sitting  Pulse Rate 91  Pulse Rate Source Monitor  ECG Heart Rate 90  Resp 20  Level of Consciousness  Level of Consciousness Alert  MEWS COLOR  MEWS Score Color Green  Oxygen Therapy  SpO2 100 %  O2 Device Nasal Cannula  O2 Flow Rate (L/min) 3.5 L/min  Pain Assessment  Pain Scale 0-10  Pain Score 0  PCA/Epidural/Spinal Assessment  Respiratory Pattern Regular;Unlabored;Dyspnea with exertion  Height and Weight  Height 6' (1.829 m)  Weight 73.9 kg  Type of Scale Used Standing  Type of Weight Actual  BSA (Calculated - sq m) 1.94 sq meters  BMI (Calculated) 22.09  Weight in (lb) to have BMI = 25 183.9  ECG Monitoring  Telemetry Box Number [id number]  Tele Box Verification Completed by Second Verifier Completed  Glasgow Coma Scale  Eye Opening 4  [place] Verbal Response (NON-intubated) 5  [place] Motor Response 6  Glasgow Coma Scale Score 15  MEWS Score  MEWS Temp 0  MEWS Systolic 0  MEWS Pulse 0  MEWS RR 0  MEWS LOC 0  MEWS Score 0   Patient arrived from ED via stretcher, patient ambulated from stretcher into room with assistance from NT. Tele monitor applied and CCMD notified. VS obtained and stable. Patient has 0/10 pain. Family at bedside with belongings. CBG obtained by NT and RN made aware. Bed at lowest position and call bell within reach.

## 2023-11-25 NOTE — Assessment & Plan Note (Signed)
-   Pt diagnosed with CHF based on presence of the following:  PND, OA, rales on exam,     bilateral leg edema, DOE,    With noted response to IV diuretic in ER  admit on telemetry,  cycle cardiac enzymes, Cardiac Panel (last 3 results) Recent Labs    11/24/23 2109  TROPONINIHS 58*     obtain serial ECG  to evaluate for ischemia as a cause of heart failure  monitor daily weight:  Filed Weights   11/24/23 1424 11/24/23 2342  Weight: 73 kg 73 kg   Last BNP BNP (last 3 results) Recent Labs    11/24/23 1508  BNP 1,124.0*       diurese with IV lasix    40 mg IV  Daily and monitor orthostatics and creatinine to avoid over diuresis.  Order echogram to evaluate EF and valves  ACE/ARBi  Contraindicated    Cardiology emailed

## 2023-11-25 NOTE — ED Notes (Signed)
 ED TO INPATIENT HANDOFF REPORT  ED Nurse Name and Phone #:  Farrel [phone]  S Name/Age/Gender Jesse Hansen 78 y.o. adult Room/Bed: 035C/035C  Code Status   Code Status: Full Code  Home/SNF/Other Home Patient oriented to: self, place, time, and situation Is this baseline? Yes   Triage Complete: Triage complete  Chief Complaint Acute on chronic systolic CHF (congestive heart failure) (HCC) [I50.23]  Triage Note PT states 2 weeks ago dx with pneumonia from the [place] pt states SOB has been getting worse. Pt completed course of antibiotic.    Allergies Allergies  Allergen Reactions   Oxycodone -Acetaminophen  Shortness Of Breath and Nausea And Vomiting    Patient tolerates Tylenol    Codeine Nausea And Vomiting and Itching    Other reaction(s): Paresthesia, Finding of vomiting   Jardiance  [Empagliflozin ] Diarrhea and Nausea And Vomiting   Nicotine      Nicotine  Patch--Per Nurse heart issues   Niacin Other (See Comments)    unknown    Level of Care/Admitting Diagnosis ED Disposition     ED Disposition  Admit   Condition  --   Comment  Hospital Area: [facility] [[id number]]  Level of Care: Progressive [102]  Admit to Progressive based on following criteria: CARDIOVASCULAR & THORACIC of moderate stability with acute coronary syndrome symptoms/low risk myocardial infarction/hypertensive urgency/arrhythmias/heart failure potentially compromising stability and stable post cardiovascular intervention patients.  May admit patient to [facility] or [facility] if equivalent level of care is available:: No  Covid Evaluation: Asymptomatic - no recent exposure (last 10 days) testing not required  Diagnosis: Acute on chronic systolic CHF (congestive heart failure) [facility]) [[id number]]  Admitting Physician: DOUTOVA, ANASTASSIA [[id number]]  Attending Physician: DOUTOVA, ANASTASSIA [[id number]]  Certification:: I certify this patient will need inpatient services for at least 2  midnights  Expected Medical Readiness: 11/27/2023          B Medical/Surgery History Past Medical History:  Diagnosis Date   Agent orange exposure    Anxiety    Carotid artery disease (HCC)    a. s/p carotid endarterectomy remotely.   Chronic systolic CHF (congestive heart failure) (HCC)    COLONIC POLYPS, HX OF 01/29/2011   Qualifier: Diagnosis of  By: Norleen MD, Lynwood ORN    Coronary artery disease involving native coronary artery with angina pectoris [facility])    a. s/p Multiple PCIs. b. CABG x 3 in 2013 (SVG-OM1, SVG-Diag with freeLIMA-LAD from SVG-Diag hood) --> relook cath in ~2015 with at least 1 graft ~occluded by report.   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 s/p AOBF and LCE in the 1990s.   Past Surgical History:  Procedure Laterality Date   ABDOMINAL AORTIC ANEURYSM REPAIR  1996?   Done at [facility] by Dr. Oris   CAROTID ENDARTERECTOMY Left    Dr. Darron at [facility] in [place]    CAROTID PTA/STENT INTERVENTION Left 03/24/2017   Procedure: Carotid PTA/Stent Intervention;  Surgeon: Gaile ORN New, MD;  Location: [facility] INVASIVE CV LAB;  Service: Cardiovascular;  Laterality: Left;   CAROTID-SUBCLAVIAN BYPASS GRAFT Left 02/05/2022   Procedure:  LEFT CAROTID-SUBCLAVIAN BYPASS GRAFT;  Surgeon: Serene Gaile ORN, MD;  Location: [facility] OR;  Service: Vascular;  Laterality: Left;   CORONARY ARTERY BYPASS GRAFT  2013   @ [facility] [place] (b/c [facility] OR was contaminated) -- SVG-Diag with freeLIMA-LAD sewn to SVG hood, SVG-OM1   CORONARY STENT PLACEMENT  2007   LEFT HEART CATH AND CORS/GRAFTS ANGIOGRAPHY N/A 01/05/2018   Procedure: LEFT HEART CATH AND  CORS/GRAFTS ANGIOGRAPHY;  Surgeon: Jordan, Peter M, MD;  Location: [facility] INVASIVE CV LAB;  Service: Cardiovascular;  Laterality: N/A;   PROSTATE BIOPSY  2009     A IV Location/Drains/Wounds Patient Lines/Drains/Airways Status     Active Line/Drains/Airways     Name Placement date Placement time Site Days   Peripheral IV 11/24/23 20 G Anterior;Distal;Right;Upper Arm 11/24/23  2328  Arm  1   Closed System Drain 1 Left Chest Bulb (JP) 15 Fr. 02/05/22  1137  Chest  658            Intake/Output Last 24 hours No intake or output data in the 24 hours ending 11/25/23 1408  Labs/Imaging Results for orders placed or performed during the hospital encounter of 11/24/23 (from the past 48 hours)  Resp panel by RT-PCR (RSV, Flu A&B, Covid) Anterior Nasal Swab     Status: None   Collection Time: 11/24/23  2:56 PM   Specimen: Anterior Nasal Swab  Result Value Ref Range   SARS Coronavirus 2 by RT PCR NEGATIVE NEGATIVE   Influenza A by PCR NEGATIVE NEGATIVE   Influenza B by PCR NEGATIVE NEGATIVE    Comment: (NOTE) The Xpert Xpress SARS-CoV-2/FLU/RSV plus assay is intended as an aid in the diagnosis of influenza from Nasopharyngeal swab specimens and should not be used as a sole basis for treatment. Nasal washings and aspirates are unacceptable for Xpert Xpress SARS-CoV-2/FLU/RSV testing.  Fact Sheet for Patients: bloggercourse.com  Fact Sheet for Healthcare Providers: seriousbroker.it  This test is not yet approved or cleared by the [place]  FDA and has been authorized for detection and/or diagnosis of SARS-CoV-2 by FDA under an Emergency Use Authorization (EUA). This EUA will remain in effect (meaning this test can be used) for the duration of the COVID-19 declaration under Section 564(b)(1) of the Act, 21 U.S.C. section 360bbb-3(b)(1), unless the authorization is terminated or revoked.     Resp Syncytial Virus by PCR NEGATIVE  NEGATIVE    Comment: (NOTE) Fact Sheet for Patients: bloggercourse.com  Fact Sheet for Healthcare Providers: seriousbroker.it  This test is not yet approved or cleared by the [place]  FDA and has been authorized for detection and/or diagnosis of SARS-CoV-2 by FDA under an Emergency Use Authorization (EUA). This EUA will remain in effect (meaning this test can be used) for the duration of the COVID-19 declaration under Section 564(b)(1) of the Act, 21 U.S.C. section 360bbb-3(b)(1), unless the authorization is terminated or revoked.  Performed at [facility] Lab, [address], [place], [place] [address]   Comprehensive metabolic panel     Status: Abnormal   Collection Time: 11/24/23  3:00 PM  Result Value Ref Range   Sodium 136 135 - 145 mmol/L   Potassium 4.1 3.5 - 5.1 mmol/L   Chloride 101 98 - 111 mmol/L   CO2 21 (L) 22 - 32 mmol/L   Glucose, Bld 183 (H) 70 - 99 mg/dL    Comment: Glucose reference range applies only to samples taken after fasting for at least 8 hours.  BUN 31 (H) 8 - 23 mg/dL   Creatinine, Ser 8.00 (H) 0.61 - 1.24 mg/dL   Calcium  9.1 8.9 - 10.3 mg/dL   Total Protein 6.2 (L) 6.5 - 8.1 g/dL   Albumin 3.3 (L) 3.5 - 5.0 g/dL   AST 33 15 - 41 U/L   ALT 52 (H) 0 - 44 U/L   Alkaline Phosphatase 62 38 - 126 U/L   Total Bilirubin 1.0 0.0 - 1.2 mg/dL   GFR, Estimated 34 (L) >60 mL/min    Comment: (NOTE) Calculated using the CKD-EPI Creatinine Equation (2021)    Anion gap 14 5 - 15    Comment: Performed at [facility] Lab, [address] [place], [place] [address]  Brain natriuretic peptide     Status: Abnormal   Collection Time: 11/24/23  3:08 PM  Result Value Ref Range   B Natriuretic Peptide 1,124.0 (H) 0.0 - 100.0 pg/mL    Comment: Performed at [facility] Lab, [address] [place], [place] [address]  CBC with Differential/Platelet     Status: Abnormal   Collection Time: 11/24/23  9:09 PM   Result Value Ref Range   WBC 6.7 4.0 - 10.5 K/uL   RBC 3.33 (L) 4.22 - 5.81 MIL/uL   Hemoglobin 10.0 (L) 13.0 - 17.0 g/dL   HCT 68.3 (L) 60.9 - 47.9 %   MCV 94.9 80.0 - 100.0 fL   MCH 30.0 26.0 - 34.0 pg   MCHC 31.6 30.0 - 36.0 g/dL   RDW 83.8 (H) 88.4 - 84.4 %   Platelets 198 150 - 400 K/uL   nRBC 0.0 0.0 - 0.2 %   Neutrophils Relative % 67 %   Neutro Abs 4.4 1.7 - 7.7 K/uL   Lymphocytes Relative 22 %   Lymphs Abs 1.5 0.7 - 4.0 K/uL   Monocytes Relative 8 %   Monocytes Absolute 0.6 0.1 - 1.0 K/uL   Eosinophils Relative 3 %   Eosinophils Absolute 0.2 0.0 - 0.5 K/uL   Basophils Relative 0 %   Basophils Absolute 0.0 0.0 - 0.1 K/uL   Immature Granulocytes 0 %   Abs Immature Granulocytes 0.02 0.00 - 0.07 K/uL    Comment: Performed at [facility], [address] [place], [place] [address]  Troponin I (High Sensitivity)     Status: Abnormal   Collection Time: 11/24/23  9:09 PM  Result Value Ref Range   Troponin I (High Sensitivity) 58 (H) <18 ng/L    Comment: (NOTE) Elevated high sensitivity troponin I (hsTnI) values and significant  changes across serial measurements may suggest ACS but many other  chronic and acute conditions are known to elevate hsTnI results.  Refer to the Links section for chest pain algorithms and additional  guidance. Performed at [facility], [address] [place], [place] [address]   Reticulocytes     Status: Abnormal   Collection Time: 11/24/23  9:09 PM  Result Value Ref Range   Retic Ct Pct 2.2 0.4 - 3.1 %   RBC. 3.31 (L) 4.22 - 5.81 MIL/uL   Retic Count, Absolute 71.8 19.0 - 186.0 K/uL   Immature Retic Fract 16.8 (H) 2.3 - 15.9 %    Comment: Performed at [facility] Lab, [address], [place], [place] [address]  Troponin I (High Sensitivity)     Status: Abnormal   Collection Time: 11/24/23 11:30 PM  Result Value Ref Range   Troponin I (High Sensitivity) 53 (H) <  18 ng/L    Comment: (NOTE) Elevated high sensitivity  troponin I (hsTnI) values and significant  changes across serial measurements may suggest ACS but many other  chronic and acute conditions are known to elevate hsTnI results.  Refer to the Links section for chest pain algorithms and additional  guidance. Performed at [facility], [address] [place], [place] [address]   Procalcitonin     Status: None   Collection Time: 11/24/23 11:30 PM  Result Value Ref Range   Procalcitonin <0.10 ng/mL    Comment:        Interpretation: PCT (Procalcitonin) <= 0.5 ng/mL: Systemic infection (sepsis) is not likely. Local bacterial infection is possible. (NOTE)       Sepsis PCT Algorithm           Lower Respiratory Tract                                      Infection PCT Algorithm    ----------------------------     ----------------------------         PCT < 0.25 ng/mL                PCT < 0.10 ng/mL          Strongly encourage             Strongly discourage   discontinuation of antibiotics    initiation of antibiotics    ----------------------------     -----------------------------       PCT 0.25 - 0.50 ng/mL            PCT 0.10 - 0.25 ng/mL               OR       >80% decrease in PCT            Discourage initiation of                                            antibiotics      Encourage discontinuation           of antibiotics    ----------------------------     -----------------------------         PCT >= 0.50 ng/mL              PCT 0.26 - 0.50 ng/mL               AND        <80% decrease in PCT             Encourage initiation of                                             antibiotics       Encourage continuation           of antibiotics    ----------------------------     -----------------------------        PCT >= 0.50 ng/mL                  PCT > 0.50 ng/mL               AND  increase in PCT                  Strongly encourage                                      initiation of antibiotics    Strongly encourage  escalation           of antibiotics                                     -----------------------------                                           PCT <= 0.25 ng/mL                                                 OR                                        > 80% decrease in PCT                                      Discontinue / Do not initiate                                             antibiotics  Performed at [facility], [address] [place], [place] [address]   Troponin I (High Sensitivity)     Status: Abnormal   Collection Time: 11/24/23 11:32 PM  Result Value Ref Range   Troponin I (High Sensitivity) 52 (H) <18 ng/L    Comment: (NOTE) Elevated high sensitivity troponin I (hsTnI) values and significant  changes across serial measurements may suggest ACS but many other  chronic and acute conditions are known to elevate hsTnI results.  Refer to the Links section for chest pain algorithms and additional  guidance. Performed at [facility], [address] [place], [place] [address]   Osmolality     Status: Abnormal   Collection Time: 11/24/23 11:32 PM  Result Value Ref Range   Osmolality 317 (H) 275 - 295 mOsm/kg    Comment: Performed at [facility], [address] [place], [place] [address]  Phosphorus     Status: None   Collection Time: 11/24/23 11:32 PM  Result Value Ref Range   Phosphorus 3.4 2.5 - 4.6 mg/dL    Comment: Performed at [facility], [address], [place], [place] [address]  Magnesium      Status: None   Collection Time: 11/24/23 11:32 PM  Result Value Ref Range   Magnesium  2.2 1.7 - 2.4 mg/dL    Comment: Performed at [facility] Lab, [address] [place], [place] [address]  CK     Status: None  Collection Time: 11/24/23 11:32 PM  Result Value Ref Range   Total CK 91 49 - 397 U/L    Comment: Performed at [facility] Lab, [address], [place], [place] [address]  TSH     Status: None   Collection Time: 11/24/23  11:32 PM  Result Value Ref Range   TSH 3.207 0.350 - 4.500 uIU/mL    Comment: Performed by a 3rd Generation assay with a functional sensitivity of <=0.01 uIU/mL. Performed at [facility] Lab, [address], [place], [place] [address]   Rapid urine drug screen (hospital performed)     Status: None   Collection Time: 11/25/23 12:45 AM  Result Value Ref Range   Opiates NONE DETECTED NONE DETECTED   Cocaine NONE DETECTED NONE DETECTED   Benzodiazepines NONE DETECTED NONE DETECTED   Amphetamines NONE DETECTED NONE DETECTED   Tetrahydrocannabinol NONE DETECTED NONE DETECTED   Barbiturates NONE DETECTED NONE DETECTED    Comment: (NOTE) DRUG SCREEN FOR MEDICAL PURPOSES ONLY.  IF CONFIRMATION IS NEEDED FOR ANY PURPOSE, NOTIFY LAB WITHIN 5 DAYS.  LOWEST DETECTABLE LIMITS FOR URINE DRUG SCREEN Drug Class                     Cutoff (ng/mL) Amphetamine and metabolites    1000 Barbiturate and metabolites    200 Benzodiazepine                 200 Opiates and metabolites        300 Cocaine and metabolites        300 THC                            50 Performed at [facility] Lab, [address] [place], [place] [address]   Sodium, urine, random     Status: None   Collection Time: 11/25/23 12:45 AM  Result Value Ref Range   Sodium, Ur 112 mmol/L    Comment: Performed at [facility], [address], [place], [place] [address]  Osmolality, urine     Status: None   Collection Time: 11/25/23 12:45 AM  Result Value Ref Range   Osmolality, Ur 403 300 - 900 mOsm/kg    Comment: Performed at [facility], [address] [place], [place] [address]  Lactic acid, plasma     Status: None   Collection Time: 11/25/23  1:40 AM  Result Value Ref Range   Lactic Acid, Venous 1.9 0.5 - 1.9 mmol/L    Comment: Performed at [facility] Lab, [address], [place], [place] [address]  CBG monitoring, ED     Status: Abnormal   Collection Time: 11/25/23  1:42 AM  Result Value Ref Range    Glucose-Capillary 177 (H) 70 - 99 mg/dL    Comment: Glucose reference range applies only to samples taken after fasting for at least 8 hours.  Blood gas, venous     Status: Abnormal   Collection Time: 11/25/23  4:05 AM  Result Value Ref Range   pH, Ven 7.38 7.25 - 7.43   pCO2, Ven 41 (L) 44 - 60 mmHg   pO2, Ven 43 32 - 45 mmHg   Bicarbonate 24.3 20.0 - 28.0 mmol/L   Acid-base deficit 0.8 0.0 - 2.0 mmol/L   O2 Saturation 72.9 %   Patient temperature 37.0     Comment: Performed at [facility] Lab, [address] [place], [place] [address]  Lactic acid, plasma     Status: None   Collection Time: 11/25/23  4:05 AM  Result Value Ref Range   Lactic Acid, Venous 1.2 0.5 - 1.9 mmol/L    Comment: Performed at [facility], [address], [place], [place] [address]  Ethanol     Status: None   Collection Time: 11/25/23  4:10 AM  Result Value Ref Range   Alcohol , Ethyl (B) <10 <10 mg/dL    Comment: (NOTE) Lowest detectable limit for serum alcohol  is 10 mg/dL.  For medical purposes only. Performed at [facility] Lab, [address] [place], [place] [address]   Vitamin B12     Status: None   Collection Time: 11/25/23  4:10 AM  Result Value Ref Range   Vitamin B-12 736 180 - 914 pg/mL    Comment: (NOTE) This assay is not validated for testing neonatal or myeloproliferative syndrome specimens for Vitamin B12 levels. Performed at [facility], [address], [place], [place] [address]   Folate     Status: None   Collection Time: 11/25/23  4:10 AM  Result Value Ref Range   Folate 19.9 >5.9 ng/mL    Comment: Performed at [facility] Lab, [address], [place], [place] [address]  Iron and TIBC     Status: Abnormal   Collection Time: 11/25/23  4:10 AM  Result Value Ref Range   Iron 50 45 - 182 ug/dL   TIBC 704 749 - 549 ug/dL   Saturation Ratios 17 (L) 17.9 - 39.5 %   UIBC 245 ug/dL    Comment: Performed at [facility] Lab, [address],  [place], [place] [address]  Ferritin     Status: None   Collection Time: 11/25/23  4:10 AM  Result Value Ref Range   Ferritin 125 24 - 336 ng/mL    Comment: Performed at [facility], [address] [place], [place] [address]  Troponin I (High Sensitivity)     Status: Abnormal   Collection Time: 11/25/23  4:10 AM  Result Value Ref Range   Troponin I (High Sensitivity) 56 (H) <18 ng/L    Comment: (NOTE) Elevated high sensitivity troponin I (hsTnI) values and significant  changes across serial measurements may suggest ACS but many other  chronic and acute conditions are known to elevate hsTnI results.  Refer to the Links section for chest pain algorithms and additional  guidance. Performed at [facility], [address], [place], [place] [address]   Prealbumin     Status: None   Collection Time: 11/25/23  4:10 AM  Result Value Ref Range   Prealbumin 20 18 - 38 mg/dL    Comment: Performed at [facility], [address], [place], [place] [address]  CBG monitoring, ED     Status: Abnormal   Collection Time: 11/25/23  4:23 AM  Result Value Ref Range   Glucose-Capillary 203 (H) 70 - 99 mg/dL    Comment: Glucose reference range applies only to samples taken after fasting for at least 8 hours.  CBG monitoring, ED     Status: Abnormal   Collection Time: 11/25/23  9:04 AM  Result Value Ref Range   Glucose-Capillary 154 (H) 70 - 99 mg/dL    Comment: Glucose reference range applies only to samples taken after fasting for at least 8 hours.  CBG monitoring, ED  Status: Abnormal   Collection Time: 11/25/23 11:54 AM  Result Value Ref Range   Glucose-Capillary 161 (H) 70 - 99 mg/dL    Comment: Glucose reference range applies only to samples taken after fasting for at least 8 hours.  CBG monitoring, ED     Status: Abnormal   Collection Time: 11/25/23  1:42 PM  Result Value Ref Range   Glucose-Capillary 252 (H) 70 - 99 mg/dL    Comment: Glucose reference range applies only  to samples taken after fasting for at least 8 hours.   CT CHEST WO CONTRAST Result Date: 11/25/2023 CLINICAL DATA:  Abnormal xray - lung nodule, >= 1 cm Chf, sob, lung nodule EXAM: CT CHEST WITHOUT CONTRAST TECHNIQUE: Multidetector CT imaging of the chest was performed following the standard protocol without IV contrast. RADIATION DOSE REDUCTION: This exam was performed according to the departmental dose-optimization program which includes automated exposure control, adjustment of the mA and/or kV according to patient size and/or use of iterative reconstruction technique. COMPARISON:  CT angio chest 01/14/2022 FINDINGS: Cardiovascular: Left chest wall dual lead cardiac pacemaker. Normal heart size. No significant pericardial effusion. The thoracic aorta is normal in caliber. Severe atherosclerotic plaque of the thoracic aorta. Four-vessel coronary artery calcifications. Mediastinum/Nodes: No gross hilar adenopathy, noting limited sensitivity for the detection of hilar adenopathy on this noncontrast study. Enlarged mediastinal lymph nodes with as an example a precarinal 1.3 cm lymph node. No enlarged axillary lymph nodes. Thyroid gland, trachea, and esophagus demonstrate no significant findings. Small hiatal hernia. Lungs/Pleura: Mild centrilobular emphysematous changes. interlobular septal wall thickening. Diffuse bronchial wall thickening. no focal consolidation. Interval increase in size of a right upper/right lower lobe 3.7 x 4.4 cm (from 1.4 x 0.8 cm) subpleural nodule along the right major fissure with associated surrounding ground-glass airspace opacity (3 : 67). Iinterval development of a right upper lobe 1.1 cm ground-glass airspace opacity. No pulmonary mass. Bilateral small pleural effusions. No pneumothorax. Upper Abdomen: Bilateral perinephric fat stranding. Question exophytic 1.4 cm left renal lesion with a density of 43 Hounsfield units (2:163). Severe atherosclerotic plaque. Musculoskeletal: No  chest wall abnormality. No suspicious lytic or blastic osseous lesions. No acute displaced fracture. Multilevel degenerative changes of the spine. IMPRESSION: 1. Interval increase in size of a right upper/right lower lobe 3.7 x 4.4 cm (from 1.4 x 0.8 cm) subpleural nodule along the right major fissure with associated surrounding ground-glass airspace opacity. Finding concerning for malignancy. Additional imaging evaluation or consultation with Pulmonology or Thoracic Surgery recommended. 2. Mediastinal lymphadenopathy. No gross hilar adenopathy, noting limited sensitivity for the detection of hilar adenopathy on this noncontrast study. 3. Pulmonary edema with bilateral small pleural effusions. 4. Interval development of a right upper lobe 1.1 cm ground-glass airspace opacity. Initial follow-up with CT at 6 months is recommended to confirm persistence. If persistent, repeat CT is recommended every 2 years until 5 years of stability has been established. This recommendation follows the consensus statement: Guidelines for Management of Incidental Pulmonary Nodules Detected on CT Images: From the Fleischner Society 2017; Radiology 2017; 284:228-243. 5. Small hiatal hernia. 6. Indeterminate 1.5 cm left renal lesion. When the patient is clinically stable and able to follow directions and hold their breath (preferably as an outpatient) further evaluation with dedicated MRI renal protocol should be considered. 7. Aortic Atherosclerosis (ICD10-I70.0) and Emphysema (ICD10-J43.9). Electronically Signed   By: Morgane  Naveau M.D.   On: 11/25/2023 01:19   DG Chest 2 View Result Date: 11/24/2023 CLINICAL DATA:  Shortness of  breath EXAM: CHEST - 2 VIEW COMPARISON:  10/09/2022 FINDINGS: Cardiac shadow is stable. Postoperative changes are seen. Defibrillator is again noted. Focal ovoid airspace opacity is noted in the right mid lung at the superior aspect of the major fissure. This may represent mild fluid trapped in the fissure.  No other focal abnormality is noted. IMPRESSION: Thickening along the superior aspect of the major fissure on the right which may represent some mild fluid trapped in the fissure. No other focal abnormality is noted. Electronically Signed   By: Oneil Devonshire M.D.   On: 11/24/2023 17:29    Pending Labs Unresulted Labs (From admission, onward)     Start     Ordered   11/26/23 0500  Basic metabolic panel  Tomorrow morning,   R        11/25/23 1242   11/25/23 0035  Respiratory (~20 pathogens) panel by PCR  (Respiratory panel by PCR (~20 pathogens, ~24 hr TAT)  w precautions)  Once,   R        11/25/23 0036   11/25/23 0035  Expectorated Sputum Assessment w Gram Stain, Rflx to Resp Cult  Once,   R        11/25/23 0036   11/24/23 2352  Hemoglobin A1c  Add-on,   AD       Comments: To assess prior glycemic control    11/24/23 2351   11/24/23 1426  CBC with Differential  Once,   STAT        11/24/23 1426   Signed and Held  Magnesium   Tomorrow morning,   R        Signed and Held   Signed and Held  Phosphorus  Tomorrow morning,   R        Signed and Held   Signed and Held  Comprehensive metabolic panel  Tomorrow morning,   R       Question:  Release to patient  Answer:  Immediate   Signed and Held   Signed and Held  CBC  Tomorrow morning,   R       Question:  Release to patient  Answer:  Immediate   Signed and Held            Vitals/Pain Today's Vitals   11/25/23 1315 11/25/23 1330 11/25/23 1345 11/25/23 1405  BP: (!) 119/58 115/66 110/62   Pulse: 82 82 85 85  Resp: 16 (!) 25 15 15   Temp:    98.6 F (37 C)  TempSrc:    Oral  SpO2: 96% 95% 97% 98%  Weight:      Height:      PainSc:        Isolation Precautions Droplet precaution  Medications Medications  insulin  aspart (novoLOG ) injection 0-9 Units (2 Units Subcutaneous Given 11/25/23 0905)  albuterol  (PROVENTIL ) (2.5 MG/3ML) 0.083% nebulizer solution 2.5 mg (has no administration in time range)  ipratropium-albuterol   (DUONEB) 0.5-2.5 (3) MG/3ML nebulizer solution 3 mL (3 mLs Nebulization Given 11/25/23 0905)  sodium chloride  flush (NS) 0.9 % injection 3 mL (3 mLs Intravenous Given 11/25/23 1005)  budesonide  (PULMICORT ) nebulizer solution 2 mg (2 mg Nebulization Given 11/25/23 1025)  dextromethorphan -guaiFENesin  (MUCINEX  DM) 30-600 MG per 12 hr tablet 1 tablet (1 tablet Oral Given 11/25/23 1025)  furosemide  (LASIX ) injection 40 mg (has no administration in time range)  methylPREDNISolone  sodium succinate (SOLU-MEDROL ) 40 mg/mL injection 40 mg (40 mg Intravenous Given 11/25/23 0912)    Followed by  predniSONE  (DELTASONE ) tablet 20 mg (  has no administration in time range)  enoxaparin  (LOVENOX ) injection 40 mg (has no administration in time range)  furosemide  (LASIX ) injection 40 mg (40 mg Intravenous Given 11/24/23 2337)    Mobility walks     Focused Assessments Pulmonary Assessment Handoff:  Lung sounds: L Breath Sounds: Expiratory wheezes R Breath Sounds: Expiratory wheezes O2 Device: Nasal Cannula O2 Flow Rate (L/min): 2 L/min    R Recommendations: See Admitting Provider Note  Report given to:   Additional Notes: .

## 2023-11-25 NOTE — Assessment & Plan Note (Signed)
 Chronic-stable.

## 2023-11-25 NOTE — Assessment & Plan Note (Signed)
-   noted increased wheezing, cough COPD could be contributing to the symptoms   - Will initiate: Steroid taper  -  Antibiotics doxycycline  - Albuterol   PRN, - scheduled duoneb,    -  Mucinex .  Titrate O2 to saturation >90%. Follow patients respiratory status.  Order  VBG     Currently mentating well no evidence of symptomatic hypercarbia

## 2023-11-25 NOTE — Assessment & Plan Note (Signed)
 Soft pb allow permissive htn

## 2023-11-25 NOTE — Assessment & Plan Note (Signed)
 Continue Plavix and statin Crestor 40 mg

## 2023-11-25 NOTE — Consult Note (Addendum)
 Cardiology Consultation   Patient ID: Jesse Hansen MRN: [id number]; DOB: [birth year]  Admit date: 11/24/2023 Date of Consult: 11/25/2023  PCP:  Jesse Hansen ORN, MD   [facility] Providers Cardiologist:  None primarily sees the [facility]  Patient Profile:   Jesse Hansen is a 78 y.o. adult with a hx of CAD status post CABG x 3 in 2013, chronic HFrEF, ischemic cardiomyopathy with BiV ICD (presyncope and long pauses/NSVT), hypertension, hyperlipidemia, diabetes, CVA, vasculopath, PAD status post left carotid subclavian bypass 2023, agent orange syndrome, PTSD, left vertebral steal syndrome, lung cancer with right lobectomy, who is being seen 11/25/2023 for the evaluation of CHF at the request of Dr. Fairy.  History of Present Illness:   Jesse Hansen is primarily followed by the [facility].SABRA  Last seen him in 2021.  Per chart review he has history of CABG in 2013(SVG to OM1, SVG ti Diag; free LIMA to LAD.  He has had a couple catheterizations since then however last being in 2019 in the setting of reduced EF 20 to 25% as showed severe three-vessel disease with severe 3VD with 100% prox LAD; 100% prox D1; 100% midLCx (dominant vessel); 99% ostial RCA. The free LIMA to LAD was patent with collaterals to other vessels; the SVG to D1 and SVG to OM were occluded.  Distal vessels were not good targets for revascularization. Plan for continued medical treatment.  Reportedly has EF now 30 to 35% based off of echocardiogram and April 2024.  Last saw the [facility] in July 2024.  He has been deemed not a candidate for advanced therapies and has had numerous issues tolerating medications due to side effects.  He has very extensive PAD followed by vascular and has chronically been on aspirin  and Plavix .  Currently patient admitted for CHF exacerbation and has had recent pneumonia and completed antibiotic course.  Initially found to be slightly hypoxic with O2 sats in the mid 80s supplemented on 2 L oxygen.  He has been reporting  significant DOE, orthopnea, and reportedly 10 pound weight gain with peripheral edema.  Started on IV Lasix  20 mg yesterday and now increased to 40 mg twice daily with significant improvement in symptoms.  He feels significantly better now and reports that he is generally well compensated CHF and does not have issues managing volume and shortness of breath.  He is generally well functional until lately often walks with his wife.  He reports being compliant with all of his medications and diuretics.  He is accompanied today with his wife who both seem to have not a great understanding of his medical history.  Denies any chest pain.  CT of the chest shows concerning right upper/right lower lobe subpleural nodule concerning for malignancy.  Mediastinal lymphadenopathy.  He had pulmonary edema with bilateral small pleural effusions.  And numerous other noncritical findings.  BNP 1200+.  Troponins 58-53.  No new labs for today..  Creatinine 1.99.  Hemoglobin 10.   Past Medical History:  Diagnosis Date   Agent orange exposure    Anxiety    Carotid artery disease (HCC)    a. s/p carotid endarterectomy remotely.   Chronic systolic CHF (congestive heart failure) (HCC)    COLONIC POLYPS, HX OF 01/29/2011   Qualifier: Diagnosis of  By: Norleen MD, Hansen ORN    Coronary artery disease involving native coronary artery with angina pectoris [facility])    a. s/p Multiple PCIs. b. CABG x 3 in 2013 (SVG-OM1, SVG-Diag with freeLIMA-LAD from  SVG-Diag hood) --> relook cath in ~2015 with at least 1 graft ~occluded by report.   Diabetes mellitus with circulatory complication [facility])    ERECTILE DYSFUNCTION 08/15/2007   Qualifier: Diagnosis of  By: Dance CMA (AAMA), Kim     Essential hypertension    Hyperlipidemia    Insomnia    LBBB (left bundle branch block)    Pneumonia    PTSD 07/30/2010   Qualifier: Diagnosis of  By: Norleen MD, Hansen ORN    PVD (peripheral vascular disease) (HCC)    a. s/p AOBF and LCE in the 1990s.     Past Surgical History:  Procedure Laterality Date   ABDOMINAL AORTIC ANEURYSM REPAIR  1996?   Done at [facility] by Dr. Oris   CAROTID ENDARTERECTOMY Left    Dr. Darron at [facility] in [place]    CAROTID PTA/STENT INTERVENTION Left 03/24/2017   Procedure: Carotid PTA/Stent Intervention;  Surgeon: Gaile ORN New, MD;  Location: [facility] INVASIVE CV LAB;  Service: Cardiovascular;  Laterality: Left;   CAROTID-SUBCLAVIAN BYPASS GRAFT Left 02/05/2022   Procedure: LEFT CAROTID-SUBCLAVIAN BYPASS GRAFT;  Surgeon: New Gaile ORN, MD;  Location: [facility] OR;  Service: Vascular;  Laterality: Left;   CORONARY ARTERY BYPASS GRAFT  2013   @ [facility] [place], [place] (b/c [facility] OR was contaminated) -- SVG-Diag with freeLIMA-LAD sewn to SVG hood, SVG-OM1   CORONARY STENT PLACEMENT  2007   LEFT HEART CATH AND CORS/GRAFTS ANGIOGRAPHY N/A 01/05/2018   Procedure: LEFT HEART CATH AND CORS/GRAFTS ANGIOGRAPHY;  Surgeon: Jordan, Peter M, MD;  Location: [facility] INVASIVE CV LAB;  Service: Cardiovascular;  Laterality: N/A;   PROSTATE BIOPSY  2009    Inpatient Medications: Scheduled Meds:  budesonide  (PULMICORT ) nebulizer solution  2 mg Nebulization Q12H   dextromethorphan -guaiFENesin   1 tablet Oral BID   enoxaparin  (LOVENOX ) injection  40 mg Subcutaneous Q24H   furosemide   40 mg Intravenous BID   insulin  aspart  0-9 Units Subcutaneous Q4H   ipratropium-albuterol   3 mL Nebulization QID   [START ON 11/26/2023] predniSONE   20 mg Oral Q breakfast   sodium chloride  flush  3 mL Intravenous Q12H   Continuous Infusions:  PRN Meds: albuterol   Allergies:    Allergies  Allergen Reactions   Oxycodone -Acetaminophen  Shortness Of Breath and Nausea And Vomiting    Patient tolerates Tylenol    Codeine Nausea And Vomiting and Itching    Other reaction(s): Paresthesia, Finding of vomiting   Jardiance  [Empagliflozin ] Diarrhea and Nausea And Vomiting   Nicotine      Nicotine  Patch--Per Nurse heart issues   Niacin Other (See  Comments)    unknown    Social History:   Social History   Socioeconomic History   Marital status: Married    Spouse name: Not on file   Number of children: Not on file   Years of education: Not on file   Highest education level: Not on file  Occupational History   Not on file  Tobacco Use   Smoking status: Every Day    Current packs/day: 0.25    Types: Cigarettes    Passive exposure: Never   Smokeless tobacco: Never   Tobacco comments:    3 cigarettes per day  Vaping Use   Vaping status: Never Used  Substance and Sexual Activity   Alcohol  use: Not Currently    Alcohol /week: 0.0 standard drinks of alcohol    Drug use: No   Sexual activity: Not on file  Other Topics Concern   Not on file  Social History Narrative   Not on file   Social Drivers of Health   Financial Resource Strain: Not on file  Food Insecurity: Not on file  Transportation Needs: Not on file  Physical Activity: Not on file  Stress: Not on file  Social Connections: Not on file  Intimate Partner Violence: Not on file    Family History:   Family History  Problem Relation Age of Onset   Hypertension Father      ROS:  Please see the history of present illness.  All other ROS reviewed and negative.     Physical Exam/Data:   Vitals:   11/25/23 0645 11/25/23 0854 11/25/23 0945 11/25/23 1000  BP: 118/61  115/60 (!) 96/52  Pulse: 60 69 67 69  Resp: 14 15 15 15   Temp:  98.3 F (36.8 C)    TempSrc:  Oral    SpO2: 99% 100% 100% 99%  Weight:      Height:       No intake or output data in the 24 hours ending 11/25/23 1305    11/24/2023   11:42 PM 11/24/2023    2:24 PM 09/29/2023   11:00 AM  Last 3 Weights  Weight (lbs) 160 lb 15 oz 160 lb 15 oz 161 lb  Weight (kg) 73 kg 73 kg 73.029 kg     Body mass index is 22.45 kg/m.  General:  Well nourished, well developed, in no acute distress HEENT: normal Neck: + JVD Vascular: No carotid bruits; Distal pulses 2+ bilaterally Cardiac:  normal S1,  S2; RRR 2/6 murmur Lungs: Subtle crackles in the bases Abd: soft, nontender, no hepatomegaly  Ext: 1+ edema Musculoskeletal:  No deformities, BUE and BLE strength normal and equal Skin: warm and dry  Neuro:  CNs 2-12 intact, no focal abnormalities noted Psych:  Normal affect   EKG:  The EKG was personally reviewed and demonstrates: V paced heart rates in the 60s Telemetry:  Telemetry was personally reviewed and demonstrates: V paced heart rates in the 60s  Relevant CV Studies: TTE 02/2023 Indication: CAD, ischemic cardiomyopathy, s/p CABG and s/p bi-V ICD Study Quality: poor Rhythm during study: sinus. LV size is at the upper limits of normal (LVIDd 5.7 cm, EDV 160 ml). LV wall thickness is at the upper limits of normal (IV septum). There is  thinning of the inferior wall. Global left ventricular systolic function is moderatley reduced. There is inferior, posterior akinesis. A part of the LV apex is akinetic as  well. Lateral wall and LV basilar segments show preserved contractility. LVEF is visually estimated at 30-35% LVEF is 29-37% by quantitation (method of disks) There is no thrombus. There is grade II diastolic LV dysfunction (pseudonormal pattern), bordering on  grade III. Left atrial pressure is moderately-to-severely elevated (E/e'-average=23) The right ventricle is normal in size and function. There is a cardiac device lead in the right ventricle. Left atrial size is at the upper limits of normal both by linear dimensions (4.0  cm) and LAVI (32 ml/m2). RA is at the upper limits of normal Aortic valve is poorly visualized. It is sclerotic but its opening appears  adequate. Peak transaortic velocities are 2.2 m/s, corresponding to a peak instantaneous  gradient of 19 mmHg No hemodynamically significant valvular aortic stenosis. Mitral valve is suboptimally visualized. There are mitral annulus  calcifications, more prominent in the posterior annulus. There is no  mitral stenosis. There is mild to moderate mitral regurgitation. Right ventricular systolic pressure unable to be evaluated  due to insufficient  TR. The IVC is normal in size with an inspiratory collapse of greater then 50%,  suggesting normal right atrial pressure. The ascending aorta appears normal in size. There is no pericardial effusion.   Left heart catheterization 01/05/2018 Ost RCA to Prox RCA lesion is 99% stenosed. Prox LAD to Mid LAD lesion is 100% stenosed. Ost 1st Diag to 1st Diag lesion is 100% stenosed. Prox Cx to Mid Cx lesion is 100% stenosed. Dist Cx lesion is 100% stenosed. SVG. Origin to Prox Graft lesion is 100% stenosed. SVG. Origin to Prox Graft lesion is 100% stenosed. LIMA graft was visualized by angiography and is normal in caliber. The graft exhibits no disease. LV end diastolic pressure is moderately elevated.   1. Severe 3 vessel occlusive CAD    - 100% proximal LAD    - 100% proximal first diagonal    - 100% mid LCx after OM1. This is a large dominant vessel    - 99% ostial nondominant RCA 2. Patent free LIMA to the LAD. This supplies collaterals to other vessels. 3. Occluded SVG to first diagonal 4. Occluded SVG to OM 5. Moderately elevated LVEDP   Plan; there are no targets for revascularization. He is dependent on free LIMA graft to the LAD. Medical management.   Laboratory Data:  High Sensitivity Troponin:   Recent Labs  Lab 11/24/23 2109 11/24/23 2330 11/24/23 2332 11/25/23 0410  TROPONINIHS 58* 53* 52* 56*     Chemistry Recent Labs  Lab 11/24/23 1500 11/24/23 2332  NA 136  --   K 4.1  --   CL 101  --   CO2 21*  --   GLUCOSE 183*  --   BUN 31*  --   CREATININE 1.99*  --   CALCIUM  9.1  --   MG  --  2.2  GFRNONAA 34*  --   ANIONGAP 14  --     Recent Labs  Lab 11/24/23 1500  PROT 6.2*  ALBUMIN 3.3*  AST 33  ALT 52*  ALKPHOS 62  BILITOT 1.0   Lipids No results for input(s): CHOL, TRIG, HDL, LABVLDL,  LDLCALC, CHOLHDL in the last 168 hours.  Hematology Recent Labs  Lab 11/24/23 2109  WBC 6.7  RBC 3.33*  3.31*  HGB 10.0*  HCT 31.6*  MCV 94.9  MCH 30.0  MCHC 31.6  RDW 16.1*  PLT 198   Thyroid  Recent Labs  Lab 11/24/23 2332  TSH 3.207    BNP Recent Labs  Lab 11/24/23 1508  BNP 1,124.0*    DDimer No results for input(s): DDIMER in the last 168 hours.   Radiology/Studies:  CT CHEST WO CONTRAST Result Date: 11/25/2023 CLINICAL DATA:  Abnormal xray - lung nodule, >= 1 cm Chf, sob, lung nodule EXAM: CT CHEST WITHOUT CONTRAST TECHNIQUE: Multidetector CT imaging of the chest was performed following the standard protocol without IV contrast. RADIATION DOSE REDUCTION: This exam was performed according to the departmental dose-optimization program which includes automated exposure control, adjustment of the mA and/or kV according to patient size and/or use of iterative reconstruction technique. COMPARISON:  CT angio chest 01/14/2022 FINDINGS: Cardiovascular: Left chest wall dual lead cardiac pacemaker. Normal heart size. No significant pericardial effusion. The thoracic aorta is normal in caliber. Severe atherosclerotic plaque of the thoracic aorta. Four-vessel coronary artery calcifications. Mediastinum/Nodes: No gross hilar adenopathy, noting limited sensitivity for the detection of hilar adenopathy on this noncontrast study. Enlarged mediastinal lymph nodes with as an example a  precarinal 1.3 cm lymph node. No enlarged axillary lymph nodes. Thyroid gland, trachea, and esophagus demonstrate no significant findings. Small hiatal hernia. Lungs/Pleura: Mild centrilobular emphysematous changes. interlobular septal wall thickening. Diffuse bronchial wall thickening. no focal consolidation. Interval increase in size of a right upper/right lower lobe 3.7 x 4.4 cm (from 1.4 x 0.8 cm) subpleural nodule along the right major fissure with associated surrounding ground-glass airspace opacity (3 :  67). Iinterval development of a right upper lobe 1.1 cm ground-glass airspace opacity. No pulmonary mass. Bilateral small pleural effusions. No pneumothorax. Upper Abdomen: Bilateral perinephric fat stranding. Question exophytic 1.4 cm left renal lesion with a density of 43 Hounsfield units (2:163). Severe atherosclerotic plaque. Musculoskeletal: No chest wall abnormality. No suspicious lytic or blastic osseous lesions. No acute displaced fracture. Multilevel degenerative changes of the spine. IMPRESSION: 1. Interval increase in size of a right upper/right lower lobe 3.7 x 4.4 cm (from 1.4 x 0.8 cm) subpleural nodule along the right major fissure with associated surrounding ground-glass airspace opacity. Finding concerning for malignancy. Additional imaging evaluation or consultation with Pulmonology or Thoracic Surgery recommended. 2. Mediastinal lymphadenopathy. No gross hilar adenopathy, noting limited sensitivity for the detection of hilar adenopathy on this noncontrast study. 3. Pulmonary edema with bilateral small pleural effusions. 4. Interval development of a right upper lobe 1.1 cm ground-glass airspace opacity. Initial follow-up with CT at 6 months is recommended to confirm persistence. If persistent, repeat CT is recommended every 2 years until 5 years of stability has been established. This recommendation follows the consensus statement: Guidelines for Management of Incidental Pulmonary Nodules Detected on CT Images: From the Fleischner Society 2017; Radiology 2017; 284:228-243. 5. Small hiatal hernia. 6. Indeterminate 1.5 cm left renal lesion. When the patient is clinically stable and able to follow directions and hold their breath (preferably as an outpatient) further evaluation with dedicated MRI renal protocol should be considered. 7. Aortic Atherosclerosis (ICD10-I70.0) and Emphysema (ICD10-J43.9). Electronically Signed   By: Morgane  Naveau M.D.   On: 11/25/2023 01:19   DG Chest 2 View Result  Date: 11/24/2023 CLINICAL DATA:  Shortness of breath EXAM: CHEST - 2 VIEW COMPARISON:  10/09/2022 FINDINGS: Cardiac shadow is stable. Postoperative changes are seen. Defibrillator is again noted. Focal ovoid airspace opacity is noted in the right mid lung at the superior aspect of the major fissure. This may represent mild fluid trapped in the fissure. No other focal abnormality is noted. IMPRESSION: Thickening along the superior aspect of the major fissure on the right which may represent some mild fluid trapped in the fissure. No other focal abnormality is noted. Electronically Signed   By: Oneil Devonshire M.D.   On: 11/24/2023 17:29     Assessment and Plan:   Chronic HFrEF ICM with Bi[facility]  Last echocardiogram in April 2024 shows EF 30 to 35%.  Volume up on exam but improving with diuretics that have been just been titrated up, agree with this.   Continue IV Lasix  40 mg twice daily GDMT limited by medication intolerances including Jardiance  per [facility] records.  He has been hypotensive here so these have been held. PTA was on carvedilol  6.25 mg twice daily, losartan  25 mg, spironolactone  25 mg.  Titrate back as BP improves. Echocardiogram pending Reportedly not a candidate for advanced therapies.  CAD status post CABG 2013 Last catheterization was in May 2019 that showed no targets for revascularization.  He is dependent on his free LIMA graft to the LAD.  No  anginal complaints.  Stable. Continue with aspirin , rosuvastatin   PAD Extensive procedures.  Chronically on aspirin  and Plavix  follows with vascular.  CKD Followed by nephrology.  Creatinine 1.99.  Continue to follow.  Pulmonary nodules Noted on CT concerning for malignancy.  Primary team to follow-up.  Pneumonia/COPD Per primary team.  Unclear why he is on amiodarone.  This was not listed at his previous office visit in July.  Him and his wife are both unfamiliar with this medication.  Will message primary  team.   Risk Assessment/Risk Scores:     [place]  Heart Association (NYHA) Functional Class NYHA Class III For questions or updates, please contact [facility] Please consult [email or web address] for contact info under  Signed, Thom LITTIE Sluder, PA-C  11/25/2023 1:05 PM   Patient seen and examined, note reviewed with the signed Advanced Practice Provider. I personally reviewed laboratory data, imaging studies and relevant notes. I independently examined the patient and formulated the important aspects of the plan. I have personally discussed the plan with the patient and/or family. Comments or changes to the note/plan are indicated below.   Clinically he appears to be volume overloaded. Has been started on Lasix  40 mg IV twice a day. Will recommend to continue this for now. Strict input and output. Daily weights.  Blood pressure marginal at this time - ok to hold antihypertensive. Restart home medication for GDMT once blood pressure can tolerate.  Hx of CAD - no angina symptoms.   PAD - continue current regimen with aspirin  and plavix .   No clear understanding yet why he is on amiodarone. Will continue it for now.   Kylina Vultaggio DO, MS [facility] Attending Cardiologist [facility]  [address] [place], [place] [address] [phone] Website: [email or web address]

## 2023-11-25 NOTE — Assessment & Plan Note (Signed)
 Obtain anemia panel  Transfuse for Hg <7 , rapidly dropping or  if symptomatic Hemoccult stool given decreased in hg

## 2023-11-25 NOTE — ED Notes (Signed)
 MD a [place] approved heart healthy diet for patient.

## 2023-11-25 NOTE — Assessment & Plan Note (Signed)
 Patient was diagnosed in the past with lung cancer s/p resection. Given was prior any respiratory status obtain CT without contrast to further evaluate

## 2023-11-25 NOTE — Progress Notes (Signed)
 PROGRESS NOTE    Jesse Hansen  FMW:[id number] DOB: 09-21-1946 DOA: 11/24/2023 PCP: [name] ORN, MD  77/M with chronic systolic CHF, EF 79-74% in 2019, peripheral vascular disease, diabetes, CKD 3B, COPD, lung cancer with right lobectomy, recent radiation presented to the ED with progressive shortness of breath dyspnea and edema, recently treated for pneumonia, compliant with Lasix , reportedly gained 10 pounds recently In the ED, hypoxic, labs with creatinine 1.9, BNP 1124, troponin 58, hemoglobin 10.0, CT chest noted increase in right lung nodule from 1.4 to 4.4 cm concerning for malignancy, mediastinal adenopathy, pulmonary edema and pleural effusions, and right upper lobe 1.1 cm airspace opacity   Subjective: -Feels a little better  Assessment and Plan:  Acute on chronic systolic CHF -Last echo in our system with EF 20-25%, followed by cardiology at the [facility], records not available at this time -Continue IV Lasix , increased dose to 40 Mg twice daily, check 2D echo -Add SGLT2i and Aldactone  if kidney function improves  Lung cancer -Followed by oncology at [facility], previously treated with lobectomy, underwent radiation therapy in September -On imaging now with increasing size of right lung lesion -Recommended close follow-up with his oncologist in [facility], patient tells me he and his wife will call  COPD -Clinically do not suspect exacerbation, no wheezing or productive cough -Taper off prednisone , continue nebs  Elevated troponin This is secondary to demand ischemia from CHF, do not suspect ACS  CKD 3B -Creatinine relatively stable, monitor  Type 2 diabetes mellitus Continue long-acting insulin , dose decreased, SSI  History of CVA Continue Crestor  and Plavix   DVT prophylaxis: Add Lovenox  Code Status: Full code Family Communication: None present Disposition Plan: Home likely 48 hours  Consultants:    Procedures:   Antimicrobials:     Objective: Vitals:   11/25/23 0645 11/25/23 0854 11/25/23 0945 11/25/23 1000  BP: 118/61  115/60 (!) 96/52  Pulse: 60 69 67 69  Resp: 14 15 15 15   Temp:  98.3 F (36.8 C)    TempSrc:  Oral    SpO2: 99% 100% 100% 99%  Weight:      Height:       No intake or output data in the 24 hours ending 11/25/23 1027 Filed Weights   11/24/23 1424 11/24/23 2342  Weight: 73 kg 73 kg    Examination:  General exam: Appears calm and comfortable  Respiratory system: Clear to auscultation Cardiovascular system: S1 & S2 heard, RRR.  Abd: nondistended, soft and nontender.Normal bowel sounds heard. Central nervous system: Alert and oriented. No focal neurological deficits. Extremities: no edema Skin: No rashes Psychiatry:  Mood & affect appropriate.     Data Reviewed:   CBC: Recent Labs  Lab 11/24/23 2109  WBC 6.7  NEUTROABS 4.4  HGB 10.0*  HCT 31.6*  MCV 94.9  PLT 198   Basic Metabolic Panel: Recent Labs  Lab 11/24/23 1500 11/24/23 2332  NA 136  --   K 4.1  --   CL 101  --   CO2 21*  --   GLUCOSE 183*  --   BUN 31*  --   CREATININE 1.99*  --   CALCIUM  9.1  --   MG  --  2.2  PHOS  --  3.4   GFR: Estimated Creatinine Clearance (by C-G formula based on SCr of 1.99 mg/dL (H)) Male: 26 mL/min (A) Male: 31.6 mL/min (A) Liver Function Tests: Recent Labs  Lab 11/24/23 1500  AST 33  ALT 52*  ALKPHOS 62  BILITOT 1.0  PROT 6.2*  ALBUMIN 3.3*   No results for input(s): LIPASE, AMYLASE in the last 168 hours. No results for input(s): AMMONIA in the last 168 hours. Coagulation Profile: No results for input(s): INR, PROTIME in the last 168 hours. Cardiac Enzymes: Recent Labs  Lab 11/24/23 2332  CKTOTAL 91   BNP (last 3 results) No results for input(s): PROBNP in the last 8760 hours. HbA1C: No results for input(s): HGBA1C in the last 72 hours. CBG: Recent Labs  Lab 11/25/23 0142 11/25/23 0423 11/25/23 0904  GLUCAP 177* 203* 154*   Lipid  Profile: No results for input(s): CHOL, HDL, LDLCALC, TRIG, CHOLHDL, LDLDIRECT in the last 72 hours. Thyroid Function Tests: Recent Labs    11/24/23 2332  TSH 3.207   Anemia Panel: Recent Labs    11/24/23 2109 11/25/23 0410  VITAMINB12  --  736  FOLATE  --  19.9  FERRITIN  --  125  TIBC  --  295  IRON  --  50  RETICCTPCT 2.2  --    Urine analysis:    Component Value Date/Time   COLORURINE STRAW (A) 02/03/2022 1303   APPEARANCEUR CLEAR 02/03/2022 1303   LABSPEC 1.011 02/03/2022 1303   PHURINE 5.0 02/03/2022 1303   GLUCOSEU >=500 (A) 02/03/2022 1303   HGBUR SMALL (A) 02/03/2022 1303   BILIRUBINUR NEGATIVE 02/03/2022 1303   KETONESUR NEGATIVE 02/03/2022 1303   PROTEINUR NEGATIVE 02/03/2022 1303   NITRITE NEGATIVE 02/03/2022 1303   LEUKOCYTESUR NEGATIVE 02/03/2022 1303   Sepsis Labs: @LABRCNTIP (procalcitonin:4,lacticidven:4)  ) Recent Results (from the past 240 hours)  Resp panel by RT-PCR (RSV, Flu A&B, Covid) Anterior Nasal Swab     Status: None   Collection Time: 11/24/23  2:56 PM   Specimen: Anterior Nasal Swab  Result Value Ref Range Status   SARS Coronavirus 2 by RT PCR NEGATIVE NEGATIVE Final   Influenza A by PCR NEGATIVE NEGATIVE Final   Influenza B by PCR NEGATIVE NEGATIVE Final    Comment: (NOTE) The Xpert Xpress SARS-CoV-2/FLU/RSV plus assay is intended as an aid in the diagnosis of influenza from Nasopharyngeal swab specimens and should not be used as a sole basis for treatment. Nasal washings and aspirates are unacceptable for Xpert Xpress SARS-CoV-2/FLU/RSV testing.  Fact Sheet for Patients: bloggercourse.com  Fact Sheet for Healthcare Providers: seriousbroker.it  This test is not yet approved or cleared by the [place]  FDA and has been authorized for detection and/or diagnosis of SARS-CoV-2 by FDA under an Emergency Use Authorization (EUA). This EUA will remain in effect  (meaning this test can be used) for the duration of the COVID-19 declaration under Section 564(b)(1) of the Act, 21 U.S.C. section 360bbb-3(b)(1), unless the authorization is terminated or revoked.     Resp Syncytial Virus by PCR NEGATIVE NEGATIVE Final    Comment: (NOTE) Fact Sheet for Patients: bloggercourse.com  Fact Sheet for Healthcare Providers: seriousbroker.it  This test is not yet approved or cleared by the [place]  FDA and has been authorized for detection and/or diagnosis of SARS-CoV-2 by FDA under an Emergency Use Authorization (EUA). This EUA will remain in effect (meaning this test can be used) for the duration of the COVID-19 declaration under Section 564(b)(1) of the Act, 21 U.S.C. section 360bbb-3(b)(1), unless the authorization is terminated or revoked.  Performed at [facility], [address] [place], [place] [address]      Radiology Studies: CT CHEST WO CONTRAST Result Date: 11/25/2023 CLINICAL DATA:  Abnormal xray - lung nodule, >= 1 cm Chf, sob, lung nodule EXAM: CT CHEST WITHOUT CONTRAST TECHNIQUE: Multidetector CT imaging of the chest was performed following the standard protocol without IV contrast. RADIATION DOSE REDUCTION: This exam was performed according to the departmental dose-optimization program which includes automated exposure control, adjustment of the mA and/or kV according to patient size and/or use of iterative reconstruction technique. COMPARISON:  CT angio chest 01/14/2022 FINDINGS: Cardiovascular: Left chest wall dual lead cardiac pacemaker. Normal heart size. No significant pericardial effusion. The thoracic aorta is normal in caliber. Severe atherosclerotic plaque of the thoracic aorta. Four-vessel coronary artery calcifications. Mediastinum/Nodes: No gross hilar adenopathy, noting limited sensitivity for the detection of hilar adenopathy on this noncontrast study. Enlarged  mediastinal lymph nodes with as an example a precarinal 1.3 cm lymph node. No enlarged axillary lymph nodes. Thyroid gland, trachea, and esophagus demonstrate no significant findings. Small hiatal hernia. Lungs/Pleura: Mild centrilobular emphysematous changes. interlobular septal wall thickening. Diffuse bronchial wall thickening. no focal consolidation. Interval increase in size of a right upper/right lower lobe 3.7 x 4.4 cm (from 1.4 x 0.8 cm) subpleural nodule along the right major fissure with associated surrounding ground-glass airspace opacity (3 : 67). Iinterval development of a right upper lobe 1.1 cm ground-glass airspace opacity. No pulmonary mass. Bilateral small pleural effusions. No pneumothorax. Upper Abdomen: Bilateral perinephric fat stranding. Question exophytic 1.4 cm left renal lesion with a density of 43 Hounsfield units (2:163). Severe atherosclerotic plaque. Musculoskeletal: No chest wall abnormality. No suspicious lytic or blastic osseous lesions. No acute displaced fracture. Multilevel degenerative changes of the spine. IMPRESSION: 1. Interval increase in size of a right upper/right lower lobe 3.7 x 4.4 cm (from 1.4 x 0.8 cm) subpleural nodule along the right major fissure with associated surrounding ground-glass airspace opacity. Finding concerning for malignancy. Additional imaging evaluation or consultation with Pulmonology or Thoracic Surgery recommended. 2. Mediastinal lymphadenopathy. No gross hilar adenopathy, noting limited sensitivity for the detection of hilar adenopathy on this noncontrast study. 3. Pulmonary edema with bilateral small pleural effusions. 4. Interval development of a right upper lobe 1.1 cm ground-glass airspace opacity. Initial follow-up with CT at 6 months is recommended to confirm persistence. If persistent, repeat CT is recommended every 2 years until 5 years of stability has been established. This recommendation follows the consensus statement: Guidelines for  Management of Incidental Pulmonary Nodules Detected on CT Images: From the Fleischner Society 2017; Radiology 2017; 284:228-243. 5. Small hiatal hernia. 6. Indeterminate 1.5 cm left renal lesion. When the patient is clinically stable and able to follow directions and hold their breath (preferably as an outpatient) further evaluation with dedicated MRI renal protocol should be considered. 7. Aortic Atherosclerosis (ICD10-I70.0) and Emphysema (ICD10-J43.9). Electronically Signed   By: Morgane  Naveau M.D.   On: 11/25/2023 01:19   DG Chest 2 View Result Date: 11/24/2023 CLINICAL DATA:  Shortness of breath EXAM: CHEST - 2 VIEW COMPARISON:  10/09/2022 FINDINGS: Cardiac shadow is stable. Postoperative changes are seen. Defibrillator is again noted. Focal ovoid airspace opacity is noted in the right mid lung at the superior aspect of the major fissure. This may represent mild fluid trapped in the fissure. No other focal abnormality is noted. IMPRESSION: Thickening along the superior aspect of the major fissure on the right which may represent some mild fluid trapped in the fissure. No other focal abnormality is noted. Electronically Signed   By: Oneil Devonshire M.D.   On: 11/24/2023 17:29     Scheduled Meds:  budesonide  (PULMICORT ) nebulizer solution  2 mg Nebulization Q12H   dextromethorphan -guaiFENesin   1 tablet Oral BID   furosemide   20 mg Intravenous Daily   insulin  aspart  0-9 Units Subcutaneous Q4H   ipratropium-albuterol   3 mL Nebulization QID   [START ON 11/26/2023] predniSONE   40 mg Oral Q breakfast   sodium chloride  flush  3 mL Intravenous Q12H   Continuous Infusions:  doxycycline  (VIBRAMYCIN ) IV Stopped (11/25/23 0850)     LOS: 1 day    Time spent: 23    Sigurd Pac, MD [facility]   11/25/2023, 10:27 AM

## 2023-11-25 NOTE — Plan of Care (Signed)
  Problem: Coping: Goal: Ability to adjust to condition or change in health will improve Outcome: Progressing   Problem: Fluid Volume: Goal: Ability to maintain a balanced intake and output will improve Outcome: Progressing   Problem: Health Behavior/Discharge Planning: Goal: Ability to manage health-related needs will improve Outcome: Progressing

## 2023-11-25 NOTE — Assessment & Plan Note (Signed)
 -  Order Sensitive  SSI   - continue home insulin but decreased to   10units,  -  check TSH and HgA1C  - Hold by mouth medications

## 2023-11-25 NOTE — ED Notes (Signed)
 Patient transported to CT

## 2023-11-25 NOTE — ED Notes (Signed)
 900 ml emptied

## 2023-11-25 NOTE — ED Notes (Signed)
 ED TO INPATIENT HANDOFF REPORT  ED Nurse Name and Phone #: Lorenza [phone]  S Name/Age/Gender Jesse Hansen Rho 78 y.o. adult Room/Bed: 035C/035C  Code Status   Code Status: Full Code  Home/SNF/Other Home Patient oriented to: self, place, time, and situation Is this baseline? Yes   Triage Complete: Triage complete  Chief Complaint Acute on chronic systolic CHF (congestive heart failure) (HCC) [I50.23]  Triage Note PT states 2 weeks ago dx with pneumonia from the [place] pt states SOB has been getting worse. Pt completed course of antibiotic.    Allergies Allergies  Allergen Reactions   Oxycodone -Acetaminophen  Shortness Of Breath and Nausea And Vomiting    Patient tolerates Tylenol    Codeine Nausea And Vomiting and Itching    Other reaction(s): Paresthesia, Finding of vomiting   Jardiance  [Empagliflozin ] Diarrhea and Nausea And Vomiting   Nicotine      Nicotine  Patch--Per Nurse heart issues   Niacin Other (See Comments)    unknown    Level of Care/Admitting Diagnosis ED Disposition     ED Disposition  Admit   Condition  --   Comment  Hospital Area: [facility] [[id number]]  Level of Care: Progressive [102]  Admit to Progressive based on following criteria: CARDIOVASCULAR & THORACIC of moderate stability with acute coronary syndrome symptoms/low risk myocardial infarction/hypertensive urgency/arrhythmias/heart failure potentially compromising stability and stable post cardiovascular intervention patients.  May admit patient to [facility] or [facility] if equivalent level of care is available:: No  Covid Evaluation: Asymptomatic - no recent exposure (last 10 days) testing not required  Diagnosis: Acute on chronic systolic CHF (congestive heart failure) [facility]) [[id number]]  Admitting Physician: DOUTOVA, ANASTASSIA [[id number]]  Attending Physician: DOUTOVA, ANASTASSIA [[id number]]  Certification:: I certify this patient will need inpatient services for at least 2 midnights   Expected Medical Readiness: 11/27/2023          B Medical/Surgery History Past Medical History:  Diagnosis Date   Agent orange exposure    Anxiety    Carotid artery disease (HCC)    a. s/p carotid endarterectomy remotely.   Chronic systolic CHF (congestive heart failure) (HCC)    COLONIC POLYPS, HX OF 01/29/2011   Qualifier: Diagnosis of  By: Norleen MD, Lynwood ORN    Coronary artery disease involving native coronary artery with angina pectoris [facility])    a. s/p Multiple PCIs. b. CABG x 3 in 2013 (SVG-OM1, SVG-Diag with freeLIMA-LAD from SVG-Diag hood) --> relook cath in ~2015 with at least 1 graft ~occluded by report.   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 s/p AOBF and LCE in the 1990s.   Past Surgical History:  Procedure Laterality Date   ABDOMINAL AORTIC ANEURYSM REPAIR  1996?   Done at [facility] by Dr. Oris   CAROTID ENDARTERECTOMY Left    Dr. Darron at [facility] in [place]    CAROTID PTA/STENT INTERVENTION Left 03/24/2017   Procedure: Carotid PTA/Stent Intervention;  Surgeon: Gaile ORN New, MD;  Location: [facility] INVASIVE CV LAB;  Service: Cardiovascular;  Laterality: Left;   CAROTID-SUBCLAVIAN BYPASS GRAFT Left 02/05/2022   Procedure: LEFT  CAROTID-SUBCLAVIAN BYPASS GRAFT;  Surgeon: Serene Gaile ORN, MD;  Location: [facility] OR;  Service: Vascular;  Laterality: Left;   CORONARY ARTERY BYPASS GRAFT  2013   @ [facility] [place], [place] (b/c [facility] OR was contaminated) -- SVG-Diag with freeLIMA-LAD sewn to SVG hood, SVG-OM1   CORONARY STENT PLACEMENT  2007   LEFT HEART CATH AND CORS/GRAFTS ANGIOGRAPHY N/A 01/05/2018   Procedure: LEFT HEART CATH AND CORS/GRAFTS  ANGIOGRAPHY;  Surgeon: Jordan, Peter M, MD;  Location: [facility] INVASIVE CV LAB;  Service: Cardiovascular;  Laterality: N/A;   PROSTATE BIOPSY  2009     A IV Location/Drains/Wounds Patient Lines/Drains/Airways Status     Active Line/Drains/Airways     Name Placement date Placement time Site Days   Peripheral IV 11/24/23 20 G Anterior;Distal;Right;Upper Arm 11/24/23  2328  Arm  1   Closed System Drain 1 Left Chest Bulb (JP) 15 Fr. 02/05/22  1137  Chest  658            Intake/Output Last 24 hours No intake or output data in the 24 hours ending 11/25/23 0053  Labs/Imaging Results for orders placed or performed during the hospital encounter of 11/24/23 (from the past 48 hours)  Resp panel by RT-PCR (RSV, Flu A&B, Covid) Anterior Nasal Swab     Status: None   Collection Time: 11/24/23  2:56 PM   Specimen: Anterior Nasal Swab  Result Value Ref Range   SARS Coronavirus 2 by RT PCR NEGATIVE NEGATIVE   Influenza A by PCR NEGATIVE NEGATIVE   Influenza B by PCR NEGATIVE NEGATIVE    Comment: (NOTE) The Xpert Xpress SARS-CoV-2/FLU/RSV plus assay is intended as an aid in the diagnosis of influenza from Nasopharyngeal swab specimens and should not be used as a sole basis for treatment. Nasal washings and aspirates are unacceptable for Xpert Xpress SARS-CoV-2/FLU/RSV testing.  Fact Sheet for Patients: bloggercourse.com  Fact Sheet for Healthcare Providers: seriousbroker.it  This test is not yet approved or cleared by the [place]  FDA and has been authorized for detection and/or diagnosis of SARS-CoV-2 by FDA under an Emergency Use Authorization (EUA). This EUA will remain in effect (meaning this test can be used) for the duration of the COVID-19 declaration under Section 564(b)(1) of the Act, 21 U.S.C. section 360bbb-3(b)(1), unless the authorization is terminated or revoked.     Resp Syncytial Virus by PCR NEGATIVE NEGATIVE     Comment: (NOTE) Fact Sheet for Patients: bloggercourse.com  Fact Sheet for Healthcare Providers: seriousbroker.it  This test is not yet approved or cleared by the [place]  FDA and has been authorized for detection and/or diagnosis of SARS-CoV-2 by FDA under an Emergency Use Authorization (EUA). This EUA will remain in effect (meaning this test can be used) for the duration of the COVID-19 declaration under Section 564(b)(1) of the Act, 21 U.S.C. section 360bbb-3(b)(1), unless the authorization is terminated or revoked.  Performed at [facility] Lab, [address], [place], [place] [address]   Comprehensive metabolic panel     Status: Abnormal   Collection Time: 11/24/23  3:00 PM  Result Value Ref Range   Sodium 136 135 - 145 mmol/L   Potassium 4.1 3.5 - 5.1 mmol/L   Chloride 101 98 - 111 mmol/L   CO2 21 (L) 22 - 32 mmol/L   Glucose, Bld 183 (H) 70 - 99 mg/dL    Comment: Glucose reference range applies only to samples taken after fasting for at least 8 hours.  BUN 31 (H) 8 - 23 mg/dL   Creatinine, Ser 8.00 (H) 0.61 - 1.24 mg/dL   Calcium  9.1 8.9 - 10.3 mg/dL   Total Protein 6.2 (L) 6.5 - 8.1 g/dL   Albumin 3.3 (L) 3.5 - 5.0 g/dL   AST 33 15 - 41 U/L   ALT 52 (H) 0 - 44 U/L   Alkaline Phosphatase 62 38 - 126 U/L   Total Bilirubin 1.0 0.0 - 1.2 mg/dL   GFR, Estimated 34 (L) >60 mL/min    Comment: (NOTE) Calculated using the CKD-EPI Creatinine Equation (2021)    Anion gap 14 5 - 15    Comment: Performed at [facility], [address] [place], [place] [address]  Brain natriuretic peptide     Status: Abnormal   Collection Time: 11/24/23  3:08 PM  Result Value Ref Range   B Natriuretic Peptide 1,124.0 (H) 0.0 - 100.0 pg/mL    Comment: Performed at [facility], [address], [place], [place] [address]  CBC with Differential/Platelet     Status: Abnormal   Collection Time: 11/24/23  9:09 PM  Result  Value Ref Range   WBC 6.7 4.0 - 10.5 K/uL   RBC 3.33 (L) 4.22 - 5.81 MIL/uL   Hemoglobin 10.0 (L) 13.0 - 17.0 g/dL   HCT 68.3 (L) 60.9 - 47.9 %   MCV 94.9 80.0 - 100.0 fL   MCH 30.0 26.0 - 34.0 pg   MCHC 31.6 30.0 - 36.0 g/dL   RDW 83.8 (H) 88.4 - 84.4 %   Platelets 198 150 - 400 K/uL   nRBC 0.0 0.0 - 0.2 %   Neutrophils Relative % 67 %   Neutro Abs 4.4 1.7 - 7.7 K/uL   Lymphocytes Relative 22 %   Lymphs Abs 1.5 0.7 - 4.0 K/uL   Monocytes Relative 8 %   Monocytes Absolute 0.6 0.1 - 1.0 K/uL   Eosinophils Relative 3 %   Eosinophils Absolute 0.2 0.0 - 0.5 K/uL   Basophils Relative 0 %   Basophils Absolute 0.0 0.0 - 0.1 K/uL   Immature Granulocytes 0 %   Abs Immature Granulocytes 0.02 0.00 - 0.07 K/uL    Comment: Performed at [facility], [address], [place], [place] [address]  Troponin I (High Sensitivity)     Status: Abnormal   Collection Time: 11/24/23  9:09 PM  Result Value Ref Range   Troponin I (High Sensitivity) 58 (H) <18 ng/L    Comment: (NOTE) Elevated high sensitivity troponin I (hsTnI) values and significant  changes across serial measurements may suggest ACS but many other  chronic and acute conditions are known to elevate hsTnI results.  Refer to the Links section for chest pain algorithms and additional  guidance. Performed at [facility], [address], [place], [place] [address]   Reticulocytes     Status: Abnormal   Collection Time: 11/24/23  9:09 PM  Result Value Ref Range   Retic Ct Pct 2.2 0.4 - 3.1 %   RBC. 3.31 (L) 4.22 - 5.81 MIL/uL   Retic Count, Absolute 71.8 19.0 - 186.0 K/uL   Immature Retic Fract 16.8 (H) 2.3 - 15.9 %    Comment: Performed at [facility], [address] [place], [place] [address]  Troponin I (High Sensitivity)     Status: Abnormal   Collection Time: 11/24/23 11:30 PM  Result Value Ref Range   Troponin I (High Sensitivity) 53 (H) <  18 ng/L    Comment: (NOTE) Elevated high sensitivity troponin I  (hsTnI) values and significant  changes across serial measurements may suggest ACS but many other  chronic and acute conditions are known to elevate hsTnI results.  Refer to the Links section for chest pain algorithms and additional  guidance. Performed at [facility], [address], [place], [place] [address]   Procalcitonin     Status: None   Collection Time: 11/24/23 11:30 PM  Result Value Ref Range   Procalcitonin <0.10 ng/mL    Comment:        Interpretation: PCT (Procalcitonin) <= 0.5 ng/mL: Systemic infection (sepsis) is not likely. Local bacterial infection is possible. (NOTE)       Sepsis PCT Algorithm           Lower Respiratory Tract                                      Infection PCT Algorithm    ----------------------------     ----------------------------         PCT < 0.25 ng/mL                PCT < 0.10 ng/mL          Strongly encourage             Strongly discourage   discontinuation of antibiotics    initiation of antibiotics    ----------------------------     -----------------------------       PCT 0.25 - 0.50 ng/mL            PCT 0.10 - 0.25 ng/mL               OR       >80% decrease in PCT            Discourage initiation of                                            antibiotics      Encourage discontinuation           of antibiotics    ----------------------------     -----------------------------         PCT >= 0.50 ng/mL              PCT 0.26 - 0.50 ng/mL               AND        <80% decrease in PCT             Encourage initiation of                                             antibiotics       Encourage continuation           of antibiotics    ----------------------------     -----------------------------        PCT >= 0.50 ng/mL                  PCT > 0.50 ng/mL               AND  increase in PCT                  Strongly encourage                                      initiation of antibiotics    Strongly encourage escalation            of antibiotics                                     -----------------------------                                           PCT <= 0.25 ng/mL                                                 OR                                        > 80% decrease in PCT                                      Discontinue / Do not initiate                                             antibiotics  Performed at [facility], [address], [place], [place] [address]   Troponin I (High Sensitivity)     Status: Abnormal   Collection Time: 11/24/23 11:32 PM  Result Value Ref Range   Troponin I (High Sensitivity) 52 (H) <18 ng/L    Comment: (NOTE) Elevated high sensitivity troponin I (hsTnI) values and significant  changes across serial measurements may suggest ACS but many other  chronic and acute conditions are known to elevate hsTnI results.  Refer to the Links section for chest pain algorithms and additional  guidance. Performed at [facility], [address] [place], [place] [address]   Osmolality     Status: Abnormal   Collection Time: 11/24/23 11:32 PM  Result Value Ref Range   Osmolality 317 (H) 275 - 295 mOsm/kg    Comment: Performed at [facility], [address], [place], [place] [address]  Phosphorus     Status: None   Collection Time: 11/24/23 11:32 PM  Result Value Ref Range   Phosphorus 3.4 2.5 - 4.6 mg/dL    Comment: Performed at [facility], [address], [place], [place] [address]  Magnesium      Status: None   Collection Time: 11/24/23 11:32 PM  Result Value Ref Range   Magnesium  2.2 1.7 - 2.4 mg/dL    Comment: Performed at [facility], [address], [place], [place] [address]  CK     Status: None  Collection Time: 11/24/23 11:32 PM  Result Value Ref Range   Total CK 91 49 - 397 U/L    Comment: Performed at [facility], [address] [place], [place] [address]  TSH     Status: None   Collection Time: 11/24/23 11:32 PM   Result Value Ref Range   TSH 3.207 0.350 - 4.500 uIU/mL    Comment: Performed by a 3rd Generation assay with a functional sensitivity of <=0.01 uIU/mL. Performed at [facility], [address] [place], [place] [address]    DG Chest 2 View Result Date: 11/24/2023 CLINICAL DATA:  Shortness of breath EXAM: CHEST - 2 VIEW COMPARISON:  10/09/2022 FINDINGS: Cardiac shadow is stable. Postoperative changes are seen. Defibrillator is again noted. Focal ovoid airspace opacity is noted in the right mid lung at the superior aspect of the major fissure. This may represent mild fluid trapped in the fissure. No other focal abnormality is noted. IMPRESSION: Thickening along the superior aspect of the major fissure on the right which may represent some mild fluid trapped in the fissure. No other focal abnormality is noted. Electronically Signed   By: Oneil Devonshire M.D.   On: 11/24/2023 17:29    Pending Labs Unresulted Labs (From admission, onward)     Start     Ordered   11/25/23 0500  Vitamin B12  (Anemia Panel (PNL))  Tomorrow morning,   R        11/24/23 2332   11/25/23 0500  Folate  (Anemia Panel (PNL))  Tomorrow morning,   R        11/24/23 2332   11/25/23 0500  Prealbumin  Tomorrow morning,   R        11/24/23 2332   11/25/23 0035  Respiratory (~20 pathogens) panel by PCR  (Respiratory panel by PCR (~20 pathogens, ~24 hr TAT)  w precautions)  Once,   R        11/25/23 0036   11/25/23 0035  Expectorated Sputum Assessment w Gram Stain, Rflx to Resp Cult  Once,   R        11/25/23 0036   11/24/23 2352  Hemoglobin A1c  Add-on,   AD       Comments: To assess prior glycemic control    11/24/23 2351   11/24/23 2350  Iron and TIBC  Once,   AD        11/24/23 2350   11/24/23 2350  Ferritin  Once,   AD        11/24/23 2350   11/24/23 2340  Lactic acid, plasma  (Lactic Acid)  STAT Now then every 3 hours,   R (with STAT occurrences)      11/24/23 2339   11/24/23 2340  Urinalysis, Complete w  Microscopic -Urine, Clean Catch  Once,   R       Question Answer Comment  Release to patient Immediate   Specimen Source Urine, Clean Catch      11/24/23 2339   11/24/23 2332  Rapid urine drug screen (hospital performed)  ONCE - STAT,   STAT        11/24/23 2332   11/24/23 2332  Ethanol  Add-on,   AD        11/24/23 2332   11/24/23 2332  Sodium, urine, random  Once,   URGENT        11/24/23 2332   11/24/23 2332  Osmolality, urine  Once,   URGENT  11/24/23 2332   11/24/23 2332  Blood gas, venous  ONCE - STAT,   STAT       Question:  Release to patient  Answer:  Immediate   11/24/23 2332   11/24/23 1426  CBC with Differential  Once,   STAT        11/24/23 1426   Signed and Held  Magnesium   Tomorrow morning,   R        Signed and Held   Signed and Held  Phosphorus  Tomorrow morning,   R        Signed and Held   Signed and Held  Comprehensive metabolic panel  Tomorrow morning,   R       Question:  Release to patient  Answer:  Immediate   Signed and Held   Signed and Held  CBC  Tomorrow morning,   R       Question:  Release to patient  Answer:  Immediate   Signed and Held            Vitals/Pain Today's Vitals   11/24/23 1838 11/24/23 2200 11/24/23 2342 11/24/23 2342  BP: 96/60 101/63    Pulse: 70 71    Resp: 18 18    Temp: 98 F (36.7 C) 98.2 F (36.8 C)    TempSrc: Oral     SpO2: 100% 99%  99%  Weight:   73 kg   Height:   5' 11 (1.803 m)   PainSc:        Isolation Precautions Droplet precaution  Medications Medications  insulin  aspart (novoLOG ) injection 0-9 Units (has no administration in time range)  albuterol  (PROVENTIL ) (2.5 MG/3ML) 0.083% nebulizer solution 2.5 mg (has no administration in time range)  ipratropium-albuterol  (DUONEB) 0.5-2.5 (3) MG/3ML nebulizer solution 3 mL (has no administration in time range)  sodium chloride  flush (NS) 0.9 % injection 3 mL (has no administration in time range)  budesonide  (PULMICORT ) nebulizer solution 2 mg  (has no administration in time range)  azithromycin  (ZITHROMAX ) 500 mg in sodium chloride  0.9 % 250 mL IVPB (has no administration in time range)    Followed by  azithromycin  (ZITHROMAX ) tablet 500 mg (has no administration in time range)  methylPREDNISolone  sodium succinate (SOLU-MEDROL ) 40 mg/mL injection 40 mg (has no administration in time range)    Followed by  predniSONE  (DELTASONE ) tablet 40 mg (has no administration in time range)  furosemide  (LASIX ) injection 40 mg (40 mg Intravenous Given 11/24/23 2337)    Mobility walks     Focused Assessments Cardiac Assessment Handoff:  Cardiac Rhythm: Normal sinus rhythm Lab Results  Component Value Date   CKTOTAL 91 11/24/2023   TROPONINI 11.26 (HH) 01/04/2018   No results found for: DDIMER Does the Patient currently have chest pain? No    R Recommendations: See Admitting Provider Note  Report given to:   Additional Notes:

## 2023-11-25 NOTE — Assessment & Plan Note (Signed)
-  chronic avoid nephrotoxic medications such as NSAIDs, Vanco Zosyn combo,  avoid hypotension, continue to follow renal function

## 2023-11-25 NOTE — Assessment & Plan Note (Signed)
 No CP in the setting of CHF, hypoxia Stable  Echo in AM No ECG changes

## 2023-11-25 NOTE — Assessment & Plan Note (Signed)
Continue Crestor 40 mg a day 

## 2023-11-25 NOTE — ED Notes (Addendum)
 Jesse Hansen

## 2023-11-26 DIAGNOSIS — Z951 Presence of aortocoronary bypass graft: Secondary | ICD-10-CM | POA: Diagnosis not present

## 2023-11-26 DIAGNOSIS — I739 Peripheral vascular disease, unspecified: Secondary | ICD-10-CM

## 2023-11-26 DIAGNOSIS — I2510 Atherosclerotic heart disease of native coronary artery without angina pectoris: Secondary | ICD-10-CM | POA: Diagnosis not present

## 2023-11-26 DIAGNOSIS — I255 Ischemic cardiomyopathy: Secondary | ICD-10-CM

## 2023-11-26 DIAGNOSIS — I5023 Acute on chronic systolic (congestive) heart failure: Secondary | ICD-10-CM | POA: Diagnosis not present

## 2023-11-26 DIAGNOSIS — E782 Mixed hyperlipidemia: Secondary | ICD-10-CM

## 2023-11-26 LAB — BASIC METABOLIC PANEL
Anion gap: 13 (ref 5–15)
BUN: 35 mg/dL — ABNORMAL HIGH (ref 8–23)
CO2: 20 mmol/L — ABNORMAL LOW (ref 22–32)
Calcium: 9.1 mg/dL (ref 8.9–10.3)
Chloride: 104 mmol/L (ref 98–111)
Creatinine, Ser: 1.65 mg/dL — ABNORMAL HIGH (ref 0.61–1.24)
GFR, Estimated: 42 mL/min — ABNORMAL LOW (ref >60)
Glucose, Bld: 165 mg/dL — ABNORMAL HIGH (ref 70–99)
Potassium: 3.7 mmol/L (ref 3.5–5.1)
Sodium: 137 mmol/L (ref 135–145)

## 2023-11-26 LAB — GLUCOSE, CAPILLARY
Glucose-Capillary: 155 mg/dL — ABNORMAL HIGH (ref 70–99)
Glucose-Capillary: 268 mg/dL — ABNORMAL HIGH (ref 70–99)
Glucose-Capillary: 243 mg/dL — ABNORMAL HIGH (ref 70–99)
Glucose-Capillary: 283 mg/dL — ABNORMAL HIGH (ref 70–99)

## 2023-11-26 LAB — RESPIRATORY PANEL BY PCR

## 2023-11-26 MED ORDER — FUROSEMIDE 10 MG/ML IJ SOLN
20.0000 mg | Freq: Two times a day (BID) | INTRAMUSCULAR | Status: DC
Start: 2023-11-26 — End: 2023-11-27
  Administered 2023-11-26 – 2023-11-27 (×2): 20 mg via INTRAVENOUS
  Filled 2023-11-26 (×2): qty 2

## 2023-11-26 MED ORDER — PREDNISONE 20 MG PO TABS
20.0000 mg | ORAL_TABLET | Freq: Every day | ORAL | Status: AC
  Administered 2023-11-26: 20 mg via ORAL
  Filled 2023-11-26: qty 1

## 2023-11-26 MED ORDER — SPIRONOLACTONE 25 MG PO TABS
25.0000 mg | ORAL_TABLET | Freq: Every day | ORAL | Status: DC
  Administered 2023-11-26: 25 mg via ORAL
  Filled 2023-11-26: qty 1

## 2023-11-26 MED ORDER — LOSARTAN POTASSIUM 25 MG PO TABS
12.5000 mg | ORAL_TABLET | Freq: Every evening | ORAL | Status: DC
  Administered 2023-11-26: 12.5 mg via ORAL
  Filled 2023-11-26: qty 1

## 2023-11-26 MED ORDER — IPRATROPIUM-ALBUTEROL 0.5-2.5 (3) MG/3ML IN SOLN
3.0000 mL | Freq: Two times a day (BID) | RESPIRATORY_TRACT | Status: DC
  Administered 2023-11-26: 3 mL via RESPIRATORY_TRACT
  Filled 2023-11-26: qty 3

## 2023-11-26 MED ORDER — SPIRONOLACTONE 12.5 MG HALF TABLET
12.5000 mg | ORAL_TABLET | Freq: Every day | ORAL | Status: DC
  Administered 2023-11-27: 12.5 mg via ORAL
  Filled 2023-11-26: qty 1

## 2023-11-26 NOTE — Progress Notes (Addendum)
 PROGRESS NOTE    Ordean Fouts [place]  FMW:[id number] DOB: 1946/05/27 DOA: 11/24/2023 PCP: [name] ORN, MD  77/M with chronic systolic CHF, EF 79-74% in 2019, peripheral vascular disease, diabetes, CKD 3B, COPD, lung cancer with right lobectomy, recent radiation presented to the ED with progressive shortness of breath dyspnea and edema, recently treated for pneumonia, compliant with Lasix , reportedly gained 10 pounds recently In the ED, hypoxic, labs with creatinine 1.9, BNP 1124, troponin 58, hemoglobin 10.0, CT chest noted increase in right lung nodule from 1.4 to 4.4 cm concerning for malignancy, mediastinal adenopathy, pulmonary edema and pleural effusions, and right upper lobe 1.1 cm airspace opacity   Subjective: -Feels that her overall, breathing is improving, swelling is down  Assessment and Plan:  Acute on chronic systolic CHF -Last echo in our system with EF 20-25%, followed by cardiology at the [facility], -Continue IV Lasix ,, Aldactone  -Allergic to Jardiance -?  Nausea and vomiting -Repeat echo pending  Lung cancer -Followed by oncology at [facility] in [facility], previously treated with lobectomy, underwent radiation therapy in September -On imaging now with increasing size of right lung lesion -Recommended close follow-up with his oncologist in [facility], patient tells me he and his wife will call  COPD -Clinically do not suspect exacerbation, no wheezing or productive cough -Taper off prednisone , continue nebs  Elevated troponin This is secondary to demand ischemia from CHF, do not suspect ACS  CKD 3B -Creatinine relatively stable, monitor, now improving  Type 2 diabetes mellitus Continue long-acting insulin , dose decreased, SSI  History of CVA Continue Crestor  and Plavix   DVT prophylaxis:  Lovenox  Code Status: Full code Family Communication: None present Disposition Plan: Home likely tomorrow  Consultants:    Procedures:   Antimicrobials:    Objective: Vitals:    11/25/23 1932 11/25/23 2356 11/26/23 0516 11/26/23 0756  BP: 121/70 127/63 115/69 111/65  Pulse: 90 81 69 79  Resp: 19 18 17 16   Temp: 98 F (36.7 C) 97.7 F (36.5 C) 97.9 F (36.6 C) 98.4 F (36.9 C)  TempSrc: Oral Oral Oral Oral  SpO2: 98% 100% 100% 100%  Weight:      Height:        Intake/Output Summary (Last 24 hours) at 11/26/2023 1110 Last data filed at 11/26/2023 0700 Gross per 24 hour  Intake 600 ml  Output 1875 ml  Net -1275 ml   Filed Weights   11/24/23 1424 11/24/23 2342 11/25/23 1624  Weight: 73 kg 73 kg 73.9 kg    Examination:  General exam: Appears calm and comfortable HEENT: Positive JVD CVS: S1-S2, regular rhythm Lungs: Poor air movement bilaterally Abdomen: Soft, nontender, bowel sounds present Extremities: No edema Skin: No rashes Psychiatry:  Mood & affect appropriate.     Data Reviewed:   CBC: Recent Labs  Lab 11/24/23 2109  WBC 6.7  NEUTROABS 4.4  HGB 10.0*  HCT 31.6*  MCV 94.9  PLT 198   Basic Metabolic Panel: Recent Labs  Lab 11/24/23 1500 11/24/23 2332 11/26/23 0217  NA 136  --  137  K 4.1  --  3.7  CL 101  --  104  CO2 21*  --  20*  GLUCOSE 183*  --  165*  BUN 31*  --  35*  CREATININE 1.99*  --  1.65*  CALCIUM  9.1  --  9.1  MG  --  2.2  --   PHOS  --  3.4  --    GFR: Estimated Creatinine Clearance (by C-G formula based  on SCr of 1.65 mg/dL (H)) Male: 67.5 mL/min (A) Male: 38.6 mL/min (A) Liver Function Tests: Recent Labs  Lab 11/24/23 1500  AST 33  ALT 52*  ALKPHOS 62  BILITOT 1.0  PROT 6.2*  ALBUMIN 3.3*   No results for input(s): LIPASE, AMYLASE in the last 168 hours. No results for input(s): AMMONIA in the last 168 hours. Coagulation Profile: No results for input(s): INR, PROTIME in the last 168 hours. Cardiac Enzymes: Recent Labs  Lab 11/24/23 2332  CKTOTAL 91   BNP (last 3 results) No results for input(s): PROBNP in the last 8760 hours. HbA1C: Recent Labs    11/24/23 2343   HGBA1C 8.0*   CBG: Recent Labs  Lab 11/25/23 1154 11/25/23 1342 11/25/23 1631 11/25/23 2056 11/26/23 0628  GLUCAP 161* 252* 208* 185* 155*   Lipid Profile: No results for input(s): CHOL, HDL, LDLCALC, TRIG, CHOLHDL, LDLDIRECT in the last 72 hours. Thyroid Function Tests: Recent Labs    11/24/23 2332  TSH 3.207   Anemia Panel: Recent Labs    11/24/23 2109 11/25/23 0410  VITAMINB12  --  736  FOLATE  --  19.9  FERRITIN  --  125  TIBC  --  295  IRON  --  50  RETICCTPCT 2.2  --    Urine analysis:    Component Value Date/Time   COLORURINE STRAW (A) 02/03/2022 1303   APPEARANCEUR CLEAR 02/03/2022 1303   LABSPEC 1.011 02/03/2022 1303   PHURINE 5.0 02/03/2022 1303   GLUCOSEU >=500 (A) 02/03/2022 1303   HGBUR SMALL (A) 02/03/2022 1303   BILIRUBINUR NEGATIVE 02/03/2022 1303   KETONESUR NEGATIVE 02/03/2022 1303   PROTEINUR NEGATIVE 02/03/2022 1303   NITRITE NEGATIVE 02/03/2022 1303   LEUKOCYTESUR NEGATIVE 02/03/2022 1303   Sepsis Labs: @LABRCNTIP (procalcitonin:4,lacticidven:4)  ) Recent Results (from the past 240 hours)  Resp panel by RT-PCR (RSV, Flu A&B, Covid) Anterior Nasal Swab     Status: None   Collection Time: 11/24/23  2:56 PM   Specimen: Anterior Nasal Swab  Result Value Ref Range Status   SARS Coronavirus 2 by RT PCR NEGATIVE NEGATIVE Final   Influenza A by PCR NEGATIVE NEGATIVE Final   Influenza B by PCR NEGATIVE NEGATIVE Final    Comment: (NOTE) The Xpert Xpress SARS-CoV-2/FLU/RSV plus assay is intended as an aid in the diagnosis of influenza from Nasopharyngeal swab specimens and should not be used as a sole basis for treatment. Nasal washings and aspirates are unacceptable for Xpert Xpress SARS-CoV-2/FLU/RSV testing.  Fact Sheet for Patients: bloggercourse.com  Fact Sheet for Healthcare Providers: seriousbroker.it  This test is not yet approved or cleared by the [place]  FDA  and has been authorized for detection and/or diagnosis of SARS-CoV-2 by FDA under an Emergency Use Authorization (EUA). This EUA will remain in effect (meaning this test can be used) for the duration of the COVID-19 declaration under Section 564(b)(1) of the Act, 21 U.S.C. section 360bbb-3(b)(1), unless the authorization is terminated or revoked.     Resp Syncytial Virus by PCR NEGATIVE NEGATIVE Final    Comment: (NOTE) Fact Sheet for Patients: bloggercourse.com  Fact Sheet for Healthcare Providers: seriousbroker.it  This test is not yet approved or cleared by the [place]  FDA and has been authorized for detection and/or diagnosis of SARS-CoV-2 by FDA under an Emergency Use Authorization (EUA). This EUA will remain in effect (meaning this test can be used) for the duration of the COVID-19 declaration under Section 564(b)(1) of the Act, 21 U.S.C. section 360bbb-3(b)(1),  unless the authorization is terminated or revoked.  Performed at [facility], [address] [place], [place] [address]      Radiology Studies: CT CHEST WO CONTRAST Result Date: 11/25/2023 CLINICAL DATA:  Abnormal xray - lung nodule, >= 1 cm Chf, sob, lung nodule EXAM: CT CHEST WITHOUT CONTRAST TECHNIQUE: Multidetector CT imaging of the chest was performed following the standard protocol without IV contrast. RADIATION DOSE REDUCTION: This exam was performed according to the departmental dose-optimization program which includes automated exposure control, adjustment of the mA and/or kV according to patient size and/or use of iterative reconstruction technique. COMPARISON:  CT angio chest 01/14/2022 FINDINGS: Cardiovascular: Left chest wall dual lead cardiac pacemaker. Normal heart size. No significant pericardial effusion. The thoracic aorta is normal in caliber. Severe atherosclerotic plaque of the thoracic aorta. Four-vessel coronary artery calcifications.  Mediastinum/Nodes: No gross hilar adenopathy, noting limited sensitivity for the detection of hilar adenopathy on this noncontrast study. Enlarged mediastinal lymph nodes with as an example a precarinal 1.3 cm lymph node. No enlarged axillary lymph nodes. Thyroid gland, trachea, and esophagus demonstrate no significant findings. Small hiatal hernia. Lungs/Pleura: Mild centrilobular emphysematous changes. interlobular septal wall thickening. Diffuse bronchial wall thickening. no focal consolidation. Interval increase in size of a right upper/right lower lobe 3.7 x 4.4 cm (from 1.4 x 0.8 cm) subpleural nodule along the right major fissure with associated surrounding ground-glass airspace opacity (3 : 67). Iinterval development of a right upper lobe 1.1 cm ground-glass airspace opacity. No pulmonary mass. Bilateral small pleural effusions. No pneumothorax. Upper Abdomen: Bilateral perinephric fat stranding. Question exophytic 1.4 cm left renal lesion with a density of 43 Hounsfield units (2:163). Severe atherosclerotic plaque. Musculoskeletal: No chest wall abnormality. No suspicious lytic or blastic osseous lesions. No acute displaced fracture. Multilevel degenerative changes of the spine. IMPRESSION: 1. Interval increase in size of a right upper/right lower lobe 3.7 x 4.4 cm (from 1.4 x 0.8 cm) subpleural nodule along the right major fissure with associated surrounding ground-glass airspace opacity. Finding concerning for malignancy. Additional imaging evaluation or consultation with Pulmonology or Thoracic Surgery recommended. 2. Mediastinal lymphadenopathy. No gross hilar adenopathy, noting limited sensitivity for the detection of hilar adenopathy on this noncontrast study. 3. Pulmonary edema with bilateral small pleural effusions. 4. Interval development of a right upper lobe 1.1 cm ground-glass airspace opacity. Initial follow-up with CT at 6 months is recommended to confirm persistence. If persistent, repeat CT  is recommended every 2 years until 5 years of stability has been established. This recommendation follows the consensus statement: Guidelines for Management of Incidental Pulmonary Nodules Detected on CT Images: From the Fleischner Society 2017; Radiology 2017; 284:228-243. 5. Small hiatal hernia. 6. Indeterminate 1.5 cm left renal lesion. When the patient is clinically stable and able to follow directions and hold their breath (preferably as an outpatient) further evaluation with dedicated MRI renal protocol should be considered. 7. Aortic Atherosclerosis (ICD10-I70.0) and Emphysema (ICD10-J43.9). Electronically Signed   By: Morgane  Naveau M.D.   On: 11/25/2023 01:19   DG Chest 2 View Result Date: 11/24/2023 CLINICAL DATA:  Shortness of breath EXAM: CHEST - 2 VIEW COMPARISON:  10/09/2022 FINDINGS: Cardiac shadow is stable. Postoperative changes are seen. Defibrillator is again noted. Focal ovoid airspace opacity is noted in the right mid lung at the superior aspect of the major fissure. This may represent mild fluid trapped in the fissure. No other focal abnormality is noted. IMPRESSION: Thickening along the superior aspect of the major fissure on  the right which may represent some mild fluid trapped in the fissure. No other focal abnormality is noted. Electronically Signed   By: Oneil Devonshire M.D.   On: 11/24/2023 17:29     Scheduled Meds:  budesonide  (PULMICORT ) nebulizer solution  2 mg Nebulization Q12H   dextromethorphan -guaiFENesin   1 tablet Oral BID   enoxaparin  (LOVENOX ) injection  40 mg Subcutaneous Q24H   furosemide   40 mg Intravenous BID   insulin  aspart  0-9 Units Subcutaneous TID WC   ipratropium-albuterol   3 mL Nebulization BID   sodium chloride  flush  3 mL Intravenous Q12H   spironolactone   25 mg Oral Daily   Continuous Infusions:     LOS: 2 days    Time spent: 12    Sigurd Pac, MD [facility]   11/26/2023, 11:10 AM

## 2023-11-26 NOTE — Plan of Care (Signed)
  Problem: Education: Goal: Ability to demonstrate management of disease process will improve Outcome: Progressing   Problem: Activity: Goal: Capacity to carry out activities will improve Outcome: Progressing   

## 2023-11-26 NOTE — Evaluation (Signed)
 Occupational Therapy Evaluation Patient Details Name: Jesse Hansen MRN: [id number] DOB: 04/19/1946 Today's Date: 11/26/2023   History of Present Illness Pt is a 78 y.o. male who presented 11/24/23 with increased SOB and leg edema. Recently treated for PNA. Admitted with acute on chronic systolic CHF. PMH: HTN, HLD, PTSD, agent orange exposure, PVD, heart failure, diabetes, NSTEMI, defibrillator, systolic CHF, CKD 3b, DM2,  COPD, CAD s/p CABG 2013, lung ca right sp lobectomy   Clinical Impression   At baseline, pt is Independent to Mod I with ADLs, most IADLs, and functional mobility without an AD. At baseline, pt's wife assists with meal prep and home management tasks as needed. Pt now presents at or near baseline PLOF but with decreased activity tolerance, currently performing ADLs Independent to Supervision and functional mobility with a Rollator with Supervision. Pt will benefit from acute skilled OT services for education in use of energy conservation strategies and to increase pt safety and activity tolerance during functional tasks. Just prior to this admission, pt reports he was participating in cardiac rehab. Post acute discharge, pt will benefit from continued participation in cardiac rehab.       If plan is discharge home, recommend the following: A little help with walking and/or transfers;A little help with bathing/dressing/bathroom;Assistance with cooking/housework;Assist for transportation;Help with stairs or ramp for entrance    Functional Status Assessment  Patient has had a recent decline in their functional status and demonstrates the ability to make significant improvements in function in a reasonable and predictable amount of time.  Equipment Recommendations  None recommended by OT (Pt already has needed equipment)    Recommendations for Other Services       Precautions / Restrictions Precautions Precautions: Fall Restrictions Weight Bearing Restrictions Per Provider  Order: No      Mobility Bed Mobility Overal bed mobility: Modified Independent             General bed mobility comments: Pt able to transition supine <> sit EOB without assistance, HOB elevated    Transfers Overall transfer level: Needs assistance Equipment used: Rollator (4 wheels) Transfers: Sit to/from Stand, Bed to chair/wheelchair/BSC Sit to Stand: Supervision     Step pivot transfers: Supervision     General transfer comment: Supervision for safety, no LOB noted      Balance Overall balance assessment: Mild deficits observed, not formally tested                                         ADL either performed or assessed with clinical judgement   ADL Overall ADL's : Needs assistance/impaired Eating/Feeding: Independent;Sitting   Grooming: Supervision/safety;Standing   Upper Body Bathing: Supervision/ safety;Sitting   Lower Body Bathing: Supervison/ safety;Sit to/from stand;Sitting/lateral leans   Upper Body Dressing : Modified independent;Sitting Upper Body Dressing Details (indicate cue type and reason): assist to manage telemetry cords Lower Body Dressing: Supervision/safety;Sitting/lateral leans;Sit to/from stand   Toilet Transfer: Supervision/safety;Ambulation;Rollator (4 wheels);BSC/3in1   Toileting- Architect and Hygiene: Supervision/safety;Sit to/from stand;Sitting/lateral lean       Functional mobility during ADLs: Supervision/safety;Rollator (4 wheels) General ADL Comments: Pt with decreased activity tolerance and fatiguing quickly during functional tasks.     Vision Baseline Vision/History: 1 Wears glasses (readers) Ability to See in Adequate Light: 0 Adequate Patient Visual Report: No change from baseline       Perception  Praxis         Pertinent Vitals/Pain Pain Assessment Pain Assessment: No/denies pain Pain Intervention(s): Monitored during session     Extremity/Trunk Assessment Upper  Extremity Assessment Upper Extremity Assessment: Right hand dominant;Overall WFL for tasks assessed (Gross B UE strength 4+/5 with ROM and coordination WNL)   Lower Extremity Assessment Lower Extremity Assessment: Defer to PT evaluation   Cervical / Trunk Assessment Cervical / Trunk Assessment: Kyphotic (mild)   Communication Communication Communication: No apparent difficulties   Cognition Arousal: Alert Behavior During Therapy: WFL for tasks assessed/performed Overall Cognitive Status: Within Functional Limits for tasks assessed                                 General Comments: AAOx4 and able to follow multi-step instructions consistently.     General Comments  Pt O2 sat largely >/94% on RA throughout session with brief drop to 89% during funcitonal mobility with O2 quickly recovering to 94% with brief standing rest break. Pt's wife present throughout session. Pt and wife report several recent lifestyle changes to improve overal health, including increased physical activity in the home and eating a heart healthy diet.    Exercises     Shoulder Instructions      Home Living Family/patient expects to be discharged to:: Private residence Living Arrangements: Spouse/significant other Available Help at Discharge: Family;Available 24 hours/day Type of Home: House Home Access: Stairs to enter [employer] of Steps: 3 Entrance Stairs-Rails: Can reach both Home Layout: One level     Bathroom Shower/Tub: Chief Strategy Officer: Handicapped height Bathroom Accessibility: Yes How Accessible: Accessible via walker Home Equipment: Rolling Walker (2 wheels);Rollator (4 wheels);Cane - single point;Shower seat;Grab bars - tub/shower;Grab bars - toilet;Wheelchair - manual;Hand held shower head (grab bar is same for both because bathroom is so small)          Prior Functioning/Environment Prior Level of Function : Independent/Modified  Independent;Driving             Mobility Comments: No AD, no recent falls. Pt reports he was participating in cardiac rehab through the [facility] just prior to this admission. ADLs Comments: Independent to Mod I with ADLs; manages his own meds and finances; wife assists with meal prep and home management tasks PRN; drives        OT Problem List: Decreased activity tolerance      OT Treatment/Interventions: Self-care/ADL training;Energy conservation;Patient/family education;Therapeutic activities    OT Goals(Current goals can be found in the care plan section) Acute Rehab OT Goals Patient Stated Goal: to return home and continue with cardiac rehab OT Goal Formulation: With patient/family Time For Goal Achievement: 12/10/23 Potential to Achieve Goals: Good ADL Goals Pt Will Perform Lower Body Bathing: with modified independence;sitting/lateral leans;sit to/from stand Pt Will Perform Lower Body Dressing: with modified independence;sitting/lateral leans;sit to/from stand Pt Will Transfer to Toilet: with modified independence;regular height toilet;ambulating (with least restrictive AD) Pt Will Perform Toileting - Clothing Manipulation and hygiene: with modified independence;sit to/from stand;sitting/lateral leans Additional ADL Goal #1: Patient will demonstrate ability to Independently state 4 energy conservation strategies to increase safety and independence with functional tasks. Additional ADL Goal #2: Patient will demonstrate ability to participate in 10 or more minutes of functional or therapeutic tasks without the need for a rest break to increase safety and independence with functional tasks in the home.  OT Frequency: Min 1X/week    Co-evaluation  AM-PAC OT 6 Clicks Daily Activity     Outcome Measure Help from another person eating meals?: None Help from another person taking care of personal grooming?: A Little Help from another person toileting, which includes  using toliet, bedpan, or urinal?: A Little Help from another person bathing (including washing, rinsing, drying)?: A Little Help from another person to put on and taking off regular upper body clothing?: A Little Help from another person to put on and taking off regular lower body clothing?: A Little 6 Click Score: 19   End of Session Equipment Utilized During Treatment: Rollator (4 wheels) Nurse Communication: Mobility status;Other (comment) (vital signs)  Activity Tolerance: Patient tolerated treatment well Patient left: in bed;with call bell/phone within reach;with family/visitor present;with bed alarm set  OT Visit Diagnosis: Other (comment) (decreased activity tolerance)                Time: 8466-8454 OT Time Calculation (min): 12 min Charges:  OT General Charges $OT Visit: 1 Visit OT Evaluation $OT Eval Low Complexity: 1 Low  Margarie Rockey HERO., OTR/L, MA Acute Rehab [phone]   Margarie FORBES Horns 11/26/2023, 7:14 PM

## 2023-11-26 NOTE — Progress Notes (Signed)
SATURATION QUALIFICATIONS: (This note is used to comply with regulatory documentation for home oxygen)  Patient Saturations on Room Air at Rest = 97%  Patient Saturations on Room Air while Ambulating = 95%  Please briefly explain why patient needs home oxygen: 

## 2023-11-26 NOTE — Evaluation (Signed)
 Physical Therapy Evaluation & Discharge Patient Details Name: Jesse Hansen MRN: [id number] DOB: 06-05-1946 Today's Date: 11/26/2023  History of Present Illness  Pt is a 78 y.o. male who presented 11/24/23 with increased SOB and leg edema. Recently treated for PNA. Admitted with acute on chronic systolic CHF. PMH: HTN, HLD, PTSD, agent orange exposure, PVD, heart failure, diabetes, NSTEMI, defibrillator, systolic CHF, CKD 3b, DM2,  COPD, CAD s/p CABG 2013, lung ca right sp lobectomy   Clinical Impression  Pt presents with condition above. PTA, he was independent without DME, living with his wife in a 1-level house with 3 STE. Currently, pt is functioning close to if not at his baseline. He initially utilized a rollator but quickly progressed to no AD to ambulate, maintaining his balance even with vestibular challenges. Pt did not need any physical assistance throughout the session. All education completed and questions answered. No further acute PT needs identified. Will sign off and defer further mobility to nursing staff and mobility techs.        If plan is discharge home, recommend the following: Assistance with cooking/housework;Help with stairs or ramp for entrance   Can travel by private vehicle        Equipment Recommendations None recommended by PT  Recommendations for Other Services       Functional Status Assessment Patient has not had a recent decline in their functional status     Precautions / Restrictions Precautions Precautions: Fall Restrictions Weight Bearing Restrictions Per Provider Order: No      Mobility  Bed Mobility Overal bed mobility: Modified Independent             General bed mobility comments: Pt able to transition supine <> sit EOB without assistance, HOB elevated    Transfers Overall transfer level: Needs assistance Equipment used: Rollator (4 wheels) Transfers: Sit to/from Stand Sit to Stand: Supervision           General transfer  comment: Supervision for safety when transferring to stand from EOB to rollator, no LOB    Ambulation/Gait Ambulation/Gait assistance: Contact guard assist Gait Distance (Feet): 240 Feet Assistive device: Rollator (4 wheels), None Gait Pattern/deviations: Step-through pattern, Decreased stride length, Trunk flexed Gait velocity: reduced Gait velocity interpretation: >2.62 ft/sec, indicative of community ambulatory   General Gait Details: Pt ambulated the first half with a rollator but then progressed to no AD. No overt LOB, even when cued to change head positions when ambulating without UE support, CGA for safety  Stairs            Wheelchair Mobility     Tilt Bed    Modified Rankin (Stroke Patients Only)       Balance Overall balance assessment: Mild deficits observed, not formally tested                                           Pertinent Vitals/Pain Pain Assessment Pain Assessment: Faces Faces Pain Scale: No hurt Pain Intervention(s): Monitored during session    Home Living Family/patient expects to be discharged to:: Private residence Living Arrangements: Spouse/significant other Available Help at Discharge: Family;Available 24 hours/day Type of Home: House Home Access: Stairs to enter Entrance Stairs-Rails: Can reach both Entrance Stairs-Number of Steps: 3   Home Layout: One level Home Equipment: Agricultural Consultant (2 wheels);Rollator (4 wheels);Cane - single point;Shower seat;Grab bars - tub/shower;Grab bars - toilet;Wheelchair -  manual;Hand held shower head (grab bar is same for both because bathroom is so small)      Prior Function Prior Level of Function : Independent/Modified Independent;Driving             Mobility Comments: No AD, no recent falls ADLs Comments: manages his own meds and finances     Extremity/Trunk Assessment   Upper Extremity Assessment Upper Extremity Assessment: Defer to OT evaluation    Lower  Extremity Assessment Lower Extremity Assessment: Overall WFL for tasks assessed (gross MMT scores of 4+ to 5 bil)    Cervical / Trunk Assessment Cervical / Trunk Assessment: Kyphotic (mildly)  Communication   Communication Communication: No apparent difficulties  Cognition Arousal: Alert Behavior During Therapy: WFL for tasks assessed/performed Overall Cognitive Status: Within Functional Limits for tasks assessed                                          General Comments General comments (skin integrity, edema, etc.): To assist in managing pt's CHF, educated pt and wife on frequent mobility and on reducing salt/processed foods intake and on rinsing canned veggies/fruit or getting fresh/frozen fruit/veggies, they verbalized understanding; SpO2 briefly dropped 1x to as low as 89% on RA when ambulating but quickly rebounded to >/= 94% with a very short standing rest break on RA    Exercises     Assessment/Plan    PT Assessment Patient does not need any further PT services  PT Problem List         PT Treatment Interventions      PT Goals (Current goals can be found in the Care Plan section)  Acute Rehab PT Goals Patient Stated Goal: to go home tomorrow PT Goal Formulation: All assessment and education complete, DC therapy Time For Goal Achievement: 11/27/23 Potential to Achieve Goals: Good    Frequency       Co-evaluation               AM-PAC PT 6 Clicks Mobility  Outcome Measure Help needed turning from your back to your side while in a flat bed without using bedrails?: None Help needed moving from lying on your back to sitting on the side of a flat bed without using bedrails?: None Help needed moving to and from a bed to a chair (including a wheelchair)?: A Little Help needed standing up from a chair using your arms (e.g., wheelchair or bedside chair)?: A Little Help needed to walk in hospital room?: A Little Help needed climbing 3-5 steps with  a railing? : A Little 6 Click Score: 20    End of Session Equipment Utilized During Treatment: Gait belt Activity Tolerance: Patient tolerated treatment well Patient left: in bed;with call bell/phone within reach;with family/visitor present Nurse Communication: Mobility status;Other (comment) (sats) PT Visit Diagnosis: Other abnormalities of gait and mobility (R26.89);Unsteadiness on feet (R26.81)    Time: 8476-8467 PT Time Calculation (min) (ACUTE ONLY): 9 min   Charges:   PT Evaluation $PT Eval Low Complexity: 1 Low   PT General Charges $$ ACUTE PT VISIT: 1 Visit         Jesse Hansen, PT, DPT Acute Rehabilitation Services  Office: [phone]   Jesse Hansen 11/26/2023, 4:42 PM

## 2023-11-26 NOTE — Progress Notes (Signed)
 Patient Name: Jesse Hansen Date of Encounter: 11/26/2023 [facility] Cardiologist: None sees Dr. Waymond at the [facility].  Interval Summary  .    Denies anginal chest pain. Shortness of breath is significantly improved at rest, wants to ambulate to see how his dyspnea is with ambulation. Coughing has also improved significantly. Patient wishes to be discharged home tomorrow.  Vital Signs .    Vitals:   11/25/23 1932 11/25/23 2356 11/26/23 0516 11/26/23 0756  BP: 121/70 127/63 115/69 111/65  Pulse: 90 81 69 79  Resp: 19 18 17 16   Temp: 98 F (36.7 C) 97.7 F (36.5 C) 97.9 F (36.6 C) 98.4 F (36.9 C)  TempSrc: Oral Oral Oral Oral  SpO2: 98% 100% 100% 100%  Weight:      Height:        Intake/Output Summary (Last 24 hours) at 11/26/2023 1140 Last data filed at 11/26/2023 0700 Gross per 24 hour  Intake 600 ml  Output 2175 ml  Net -1575 ml   Net IO Since Admission: -1,575 mL [11/26/23 1140]     11/25/2023    4:24 PM 11/24/2023   11:42 PM 11/24/2023    2:24 PM  Last 3 Weights  Weight (lbs) 162 lb 14.7 oz 160 lb 15 oz 160 lb 15 oz  Weight (kg) 73.9 kg 73 kg 73 kg      Telemetry/ECG/Cardiac studies pending    Paced rhythm- Personally Reviewed  Echo: Pending.   LHC 12/2017: Ost RCA to Prox RCA lesion is 99% stenosed. Prox LAD to Mid LAD lesion is 100% stenosed. Ost 1st Diag to 1st Diag lesion is 100% stenosed. Prox Cx to Mid Cx lesion is 100% stenosed. Dist Cx lesion is 100% stenosed. SVG. Origin to Prox Graft lesion is 100% stenosed. SVG. Origin to Prox Graft lesion is 100% stenosed. LIMA graft was visualized by angiography and is normal in caliber. The graft exhibits no disease. LV end diastolic pressure is moderately elevated.   1. Severe 3 vessel occlusive CAD    - 100% proximal LAD    - 100% proximal first diagonal    - 100% mid LCx after OM1. This is a large dominant vessel    - 99% ostial nondominant RCA 2. Patent free LIMA to the LAD. This  supplies collaterals to other vessels. 3. Occluded SVG to first diagonal 4. Occluded SVG to OM 5. Moderately elevated LVEDP   Plan; there are no targets for revascularization. He is dependent on free LIMA graft to the LAD. Medical management.     Physical Exam .   GEN: No acute distress.   Neck: No JVD Cardiac: Regular, positive S1-S2, no murmurs rubs or gallops appreciated, pacemaker site is healed well. Respiratory: Clear to auscultation bilaterally. GI: Soft, nontender, non-distended  MS: Trace bilateral edema, warm to touch  Assessment & Plan .   78 y.o. adult with a hx of CAD status post CABG x 3 in 2013, chronic HFrEF, ischemic cardiomyopathy with BiV ICD (presyncope and long pauses/NSVT), hypertension, hyperlipidemia, diabetes, CVA, vasculopath, PAD status post left carotid subclavian bypass 2023, agent orange syndrome, PTSD, left vertebral steal syndrome, lung cancer with right lobectomy, who is being seen during this hospitalization for CHF management per primary team.  Acute heart failure with reduced EF Ischemic cardiomyopathy status post BiV  Stage C, NYHA class II/III. Presented with dyspnea on exertion, orthopnea, and weight gain. Overnight the patient states that his symptoms of shortness of breath improved significantly.  Able to lay flat. No shortness of breath at rest.  Will shortly walk with the nursing staff see how he does. No longer requiring nasal cannula oxygen. Continue IV Lasix  for diuresis -will reduce from 40 mg IV push twice daily to 20 mg IV push twice daily. Medication list provided by the wife reviewed at bedside. Echocardiogram pending Continue carvedilol  6.25 mg p.o. twice daily. Continue IV diuretics as noted above. Home medications include: Losartan  25 mg p.o. daily. Spironolactone  25 mg p.o. daily Will restart low-dose losartan  12.5 mg p.o. p.m. & low dose spironolactone  12.5 mg p.o. every morning. To avoid AKI. Uptitrate GDMT as outpatient as  hemodynamics and laboratory values allow. Recommended that postdischarge he should follow-up with his cardiologist at the [facility] in 2 weeks.  CAD status post CABG in 2013. Recent heart catheterization reports from 2019 reviewed. Medical management recommended given his CAD Denies anginal chest pain.  PAD: Follows with vascular surgery. Continue antiplatelet therapy. Continue home dose rosuvastatin  40 mg p.o. every night.  Pulmonary nodules: COPD. Management per primary team. Reemphasized the importance of following up as outpatient with PCP and/or pulmonary medicine given the CT findings.  My partner who saw him yesterday and continued amiodarone as there is no documentation of why he is taking it.  After reviewing his home medication list he is not on amiodarone and therefore we will discontinue it.  Plan of care discussed with patient and wife at bedside.  Nursing staff updated with the plan of care as well.  For questions or updates, please contact [facility] Please consult [email or web address] for contact info under      Signed,  Madonna Large, DO, [facility]  [facility]  [address] [place], [place] [address] Pager: [phone] Office: [phone] 11:40 AM 11/26/23

## 2023-11-27 ENCOUNTER — Inpatient Hospital Stay (HOSPITAL_COMMUNITY): Payer: No Typology Code available for payment source

## 2023-11-27 DIAGNOSIS — Z951 Presence of aortocoronary bypass graft: Secondary | ICD-10-CM | POA: Diagnosis not present

## 2023-11-27 DIAGNOSIS — I2510 Atherosclerotic heart disease of native coronary artery without angina pectoris: Secondary | ICD-10-CM | POA: Diagnosis not present

## 2023-11-27 DIAGNOSIS — I5021 Acute systolic (congestive) heart failure: Secondary | ICD-10-CM | POA: Diagnosis not present

## 2023-11-27 DIAGNOSIS — I5023 Acute on chronic systolic (congestive) heart failure: Secondary | ICD-10-CM | POA: Diagnosis not present

## 2023-11-27 DIAGNOSIS — Z95810 Presence of automatic (implantable) cardiac defibrillator: Secondary | ICD-10-CM | POA: Diagnosis not present

## 2023-11-27 LAB — ECHOCARDIOGRAM COMPLETE
AR max vel: 0.97 cm2
AV Area VTI: 0.87 cm2
AV Area mean vel: 1.02 cm2
AV Mean grad: 9 mm[Hg]
AV Peak grad: 19.4 mm[Hg]
Ao pk vel: 2.2 m/s
Area-P 1/2: 3.7 cm2
Calc EF: 16.6 %
Height: 72 in
MV M vel: 4.24 m/s
MV Peak grad: 71.9 mm[Hg]
S' Lateral: 3.6 cm
Single Plane A2C EF: 19.4 %
Single Plane A4C EF: 19.1 %
Weight: 2525.59 [oz_av]

## 2023-11-27 LAB — BASIC METABOLIC PANEL
Anion gap: 10 (ref 5–15)
BUN: 34 mg/dL — ABNORMAL HIGH (ref 8–23)
CO2: 26 mmol/L (ref 22–32)
Calcium: 9.1 mg/dL (ref 8.9–10.3)
Chloride: 100 mmol/L (ref 98–111)
Creatinine, Ser: 1.73 mg/dL — ABNORMAL HIGH (ref 0.61–1.24)
GFR, Estimated: 40 mL/min — ABNORMAL LOW (ref >60)
Glucose, Bld: 206 mg/dL — ABNORMAL HIGH (ref 70–99)
Potassium: 3.6 mmol/L (ref 3.5–5.1)
Sodium: 136 mmol/L (ref 135–145)

## 2023-11-27 LAB — GLUCOSE, CAPILLARY
Glucose-Capillary: 154 mg/dL — ABNORMAL HIGH (ref 70–99)
Glucose-Capillary: 303 mg/dL — ABNORMAL HIGH (ref 70–99)

## 2023-11-27 MED ORDER — BUDESONIDE 0.25 MG/2ML IN SUSP
RESPIRATORY_TRACT | Status: AC
  Filled 2023-11-27: qty 2

## 2023-11-27 MED ORDER — IPRATROPIUM-ALBUTEROL 0.5-2.5 (3) MG/3ML IN SOLN
3.0000 mL | Freq: Two times a day (BID) | RESPIRATORY_TRACT | Status: DC
  Filled 2023-11-27: qty 3

## 2023-11-27 MED ORDER — PERFLUTREN LIPID MICROSPHERE
1.0000 mL | INTRAVENOUS | Status: DC | PRN
  Administered 2023-11-27: 2.5 mL via INTRAVENOUS

## 2023-11-27 MED ORDER — BUDESONIDE 0.5 MG/2ML IN SUSP
RESPIRATORY_TRACT | Status: AC
  Filled 2023-11-27: qty 2

## 2023-11-27 NOTE — Progress Notes (Signed)
 Initial Nutrition Assessment  DOCUMENTATION CODES:   Not applicable  INTERVENTION:  Liberalize diet   NUTRITION DIAGNOSIS:   Increased nutrient needs related to chronic illness as evidenced by estimated needs.    GOAL:   Patient will meet greater than or equal to 90% of their needs    MONITOR:   PO intake, Supplement acceptance  REASON FOR ASSESSMENT:   Consult Assessment of nutrition requirement/status  ASSESSMENT:  78 y.o. M presented to ED complaints of SOB and leg edema.  2 weeks ago pt was diagnosed with pneumonia and has been increasing shortness of breath.  Reported that he has had a 10# weight, and leg edema. PMH; CAD, PTSD, ED, anxiety, HLD, HTN, PVD, Chf, Agent orange exposure, DM, CKD 3B, COPD, lung cancer with right lobectomy, recent radiation. CT chest noted increase in right lung nodule from 1.4 to 4.4 cm concerning for malignancy, mediastinal adenopathy, pulmonary edema and pleural effusions, and right upper lobe 1.1 cm airspace opacity.  Attempted to contact patient via phone with no answer.  All information obtained through EMR and team. Patient noted to have lung cancer with previously treated with lobectomy along with radiation therapy in September. 2+ pitting BLE, Edema noted upon arrival,  Weight changes can be expected with lasix  therapy.  No appetite changes noted independent feeding ability, no chewing or swallowing concerns noted at this time. There is no benefit to excessive dietary restrictions related advanced age, increased nutrient needs. Patient would benefit better from liberalized diet to help meet increased nutrient needs and promote better oral intake.   Admit weight: 73 kg Weight history:  11/27/23 71.6 kg  09/29/23 73 kg  03/09/23 72.6 kg  11/29/22 70.8 kg   Hospital weight history: Date/Time Weight Weight in lbs  11/27/23 0557 71.6 kg 157.85 lbs  11/25/23 1624 73.9 kg 162.92 lbs  11/24/23 23:42:20 73 kg 160.94 lbs  11/24/23 1424  73 kg 160.94 lbs     Average Meal Intake:  100% intake x 4 recorded meals  Nutritionally Relevant Medications: Scheduled Meds:  furosemide   20 mg Intravenous BID   spironolactone   12.5 mg Oral Daily    PRN Meds:.albuterol , ALPRAZolam , alum & mag hydroxide-simeth  Labs Reviewed:  CBG ranges from 206-165 mg/dL over the last 24 hours HgbA1c 8.0 (11/24/23)    NUTRITION - FOCUSED PHYSICAL EXAM:  Deferred   Diet Order:   Diet Order             Diet regular Room service appropriate? Yes; Fluid consistency: Thin  Diet effective now                   EDUCATION NEEDS:   No education needs have been identified at this time  Skin:  Skin Assessment: Reviewed RN Assessment  Last BM:  1/3  Height:   Ht Readings from Last 1 Encounters:  11/25/23 6' (1.829 m)    Weight:   Wt Readings from Last 1 Encounters:  11/27/23 71.6 kg    Ideal Body Weight:     BMI:  Body mass index is 21.41 kg/m.  Estimated Nutritional Needs:   Kcal:  2200-2500 kcal  Protein:  95-110 g  Fluid:  6ml/kcal    Jenna Pew RDN, LDN Clinical Dietitian   If unable to reach, please contact RD Inpatient secure chat group between 8 am-4 pm daily

## 2023-11-27 NOTE — Discharge Summary (Addendum)
 Physician Discharge Summary  Jesse Hansen FMW:[id number] DOB: August 13, 1946 DOA: 11/24/2023  PCP: [name] ORN, MD  Admit date: 11/24/2023 Discharge date: 11/27/2023  Time spent: 45 minutes  Recommendations for Outpatient Follow-up:  Cardiology at the [facility] in 2 weeks, severe aortic stenosis noted on echo Oncology at [facility] for follow-up of lung cancer, patient has an appointment 1/13, imaging this admit with ink receiving size of right lung lesion   Discharge Diagnoses:  Principal Problem:   Acute on chronic systolic CHF (congestive heart failure) (HCC) Severe aortic stenosis Lung cancer   Hyperlipidemia   Essential hypertension   Elevated troponin   COPD with acute exacerbation (HCC)   Chronic kidney disease, stage 3b (HCC)   Multiple nodules of lung   Diabetes mellitus (HCC)   Presence of cardiac resynchronization therapy defibrillator (CRT-D)   Normocytic anemia   History of stroke   PAD (peripheral artery disease) (HCC)   Hx of CABG   Atherosclerosis of native coronary artery of native heart without angina pectoris   Discharge Condition: Improved  Diet recommendation: Los DM, heart healthy  Filed Weights   11/24/23 2342 11/25/23 1624 11/27/23 0557  Weight: 73 kg 73.9 kg 71.6 kg    History of present illness:  78/M with chronic systolic CHF, EF 79-74% in 2019, peripheral vascular disease, diabetes, CKD 3B, COPD, lung cancer with right lobectomy, recent radiation presented to the ED with progressive shortness of breath dyspnea and edema, recently treated for pneumonia, compliant with Lasix , reportedly gained 10 pounds recently In the ED, hypoxic, labs with creatinine 1.9, BNP 1124, troponin 58, hemoglobin 10.0, CT chest noted increase in right lung nodule from 1.4 to 4.4 cm concerning for malignancy, mediastinal adenopathy, pulmonary edema and pleural effusions, and right upper lobe 1.1 cm airspace opacity     Hospital Course:   Acute on chronic systolic CHF,  ischemic cardiomyopathy -Last echo in our system in 2019 noted EF 20-25%, followed by [facility] cardiology -Echo 4/24 at [facility] noted EF 30-35%, grade 3 diastolic dysfunction and normal RV -Followed by cardiology this admission, repeat echo now EF 20-25%, reduced RV, Severe Aortic stenosis -Diuresed with IV Lasix , volume status is improving, weaned off O2 -Transitioned to oral Lasix , Aldactone , losartan  -Listed as being allergic to Jardiance  -Discharged home in a stable condition, follow-up with cardiology at the [place]  Severe aortic stenosis -Noted on echo, cardiology recommended follow-up with his primary cardiology team at the [facility]   Lung cancer -Followed by oncology at [facility] in [facility], previously treated with lobectomy, underwent radiation therapy in September -On imaging now with increasing size of right lung lesion -Recommended close follow-up with his oncologist in [facility], patient tells me he and his wife will call   COPD -Clinically do not suspect exacerbation, no wheezing or productive cough -Tapered off prednisone , continue nebs   Elevated troponin This is secondary to demand ischemia from CHF, do not suspect ACS   CKD 3B -Creatinine relatively stable, monitor, now improving   Type 2 diabetes mellitus Continue long-acting insulin , dose decreased, SSI   History of CVA Continue Crestor  and Plavix   Discharge Exam: Vitals:   11/27/23 0756 11/27/23 1034  BP: (!) 103/55 120/67  Pulse:  92  Resp:  20  Temp:  98.2 F (36.8 C)  SpO2:  96%   General exam: Appears calm and comfortable HEENT: Positive JVD CVS: S1-S2, regular rhythm Lungs: Poor air movement bilaterally Abdomen: Soft, nontender, bowel sounds present Extremities: No edema Skin:  No rashes Psychiatry:  Mood & affect appropriate.     Discharge Instructions   Discharge Instructions     Diet - low sodium heart healthy   Complete by: As directed    Diet - low sodium heart healthy   Complete by: As directed     Increase activity slowly   Complete by: As directed    Increase activity slowly   Complete by: As directed       Allergies as of 11/27/2023       Reactions   Oxycodone -acetaminophen  Shortness Of Breath, Nausea And Vomiting   Patient tolerates Tylenol    Codeine Nausea And Vomiting, Itching   Other reaction(s): Paresthesia, Finding of vomiting   Jardiance  [empagliflozin ] Diarrhea, Nausea And Vomiting   Nicotine     Nicotine  Patch--Per Nurse heart issues   Niacin Other (See Comments)   unknown        Medication List     TAKE these medications    acetaminophen  500 MG tablet Commonly known as: TYLENOL  Take 500-1,000 mg by mouth every 6 (six) hours as needed (for pain.).   albuterol  108 (90 Base) MCG/ACT inhaler Commonly known as: VENTOLIN  HFA Inhale 2 puffs into the lungs 2 (two) times daily.   amiodarone 200 MG tablet Commonly known as: PACERONE Take 200 mg by mouth daily.   ammonium lactate 12 % lotion Commonly known as: LAC-HYDRIN Apply 1 application. topically as needed for dry skin.   ascorbic acid 500 MG tablet Commonly known as: VITAMIN C Take 500 mg by mouth every morning.   aspirin  EC 81 MG tablet Take 1 tablet (81 mg total) by mouth daily. What changed: when to take this   carboxymethylcellulose 0.5 % Soln Commonly known as: REFRESH PLUS Place 1 drop into both eyes 3 (three) times daily as needed (dry/irritated eyes).   carvedilol  6.25 MG tablet Commonly known as: COREG  Take 6.25 mg by mouth 2 (two) times daily with a meal.   clopidogrel  75 MG tablet Commonly known as: PLAVIX  Take 75 mg by mouth daily.   CoQ10 50 MG Caps Take 50 mg by mouth every morning.   furosemide  40 MG tablet Commonly known as: LASIX  Take 1 tablet (40 mg total) by mouth daily.   glipiZIDE  5 MG tablet Commonly known as: GLUCOTROL  Take 7.5 mg by mouth 2 (two) times daily before a meal. Take 30 minutes prior to meals.   insulin  glargine-yfgn 100 UNIT/ML Pen Commonly  known as: SEMGLEE Inject 20 Units into the skin every evening.   ipratropium 0.03 % nasal spray Commonly known as: ATROVENT  Place 2 sprays into both nostrils 2 (two) times daily as needed for rhinitis.   LORazepam  1 MG tablet Commonly known as: ATIVAN  TAKE ONE (1) TABLET BY MOUTH TWO (2) TIMES DAILY AS NEEDED What changed:  how much to take how to take this when to take this additional instructions   losartan  25 MG tablet Commonly known as: COZAAR  Take 25 mg by mouth daily.   nitroGLYCERIN  0.4 MG SL tablet Commonly known as: NITROSTAT  Place 0.4 mg under the tongue every 5 (five) minutes x 3 doses as needed for chest pain.   rosuvastatin  40 MG tablet Commonly known as: CRESTOR  Take 40 mg by mouth every evening.   spironolactone  25 MG tablet Commonly known as: ALDACTONE  Take 25 mg by mouth daily. TAKE ONE TABLET BY MOUTH DAILY FOR CARDIOMYOPATHY - STOP POTASSIUM SUPPLEMENT. FOLLOW UP RENAL PANEL 5-10 DAYS AFTER STARTING SPIRONOLACTONE . FOR CARDIOMYOPATHY - STOP POTASSIUM SUPPLEMENT.  FOLLOW UP RENAL PANEL 5-10 DAYS AFTER STARTING SPIRONOLACTONE .   Vitamin D3 50 MCG (2000 UT) Tabs Take 50 mcg by mouth every morning.       Allergies  Allergen Reactions   Oxycodone -Acetaminophen  Shortness Of Breath and Nausea And Vomiting    Patient tolerates Tylenol    Codeine Nausea And Vomiting and Itching    Other reaction(s): Paresthesia, Finding of vomiting   Jardiance  [Empagliflozin ] Diarrhea and Nausea And Vomiting   Nicotine      Nicotine  Patch--Per Nurse heart issues   Niacin Other (See Comments)    unknown    Follow-up Information     [name] ORN, MD Follow up in 1 week(s).   Specialties: Internal Medicine, Radiology Contact information: [address] [place] [address] [phone]         Cardiology. Schedule an appointment as soon as possible for a visit in 2 week(s).   Why: at [facility]        Oncology ( Cancer Doctor). Schedule an appointment as soon as  possible for a visit in 10 day(s).   Why: at [facility]                 The results of significant diagnostics from this hospitalization (including imaging, microbiology, ancillary and laboratory) are listed below for reference.    Significant Diagnostic Studies: ECHOCARDIOGRAM COMPLETE Result Date: 11/27/2023    ECHOCARDIOGRAM REPORT   Patient Name:   BRANDON WIECHMAN Bonito Date of Exam: 11/27/2023 Medical Rec #:  [id number]     Height:       72.0 in Accession #:    [id number]    Weight:       157.8 lb Date of Birth:  [birth year]     BSA:          1.927 m Patient Age:    77 years      BP:           98/53 mmHg Patient Gender: M             HR:           84 bpm. Exam Location:  Inpatient Procedure: 2D Echo, Color Doppler, Cardiac Doppler and Intracardiac            Opacification Agent Indications:    CHF- Acute Systolic  History:        Patient has prior history of Echocardiogram examinations, most                 recent 12/24/2017. CAD; Risk Factors:Current Smoker, Hypertension,                 Dyslipidemia and Diabetes.  Sonographer:    Logan Shove RDCS Referring Phys: [phone] Miray Mancino IMPRESSIONS  1. Left ventricular ejection fraction, by estimation, is 20 to 25%. The left ventricle has severely decreased function. The left ventricle demonstrates global hypokinesis. The left ventricular internal cavity size was dilated. There is mild left ventricular hypertrophy. Left ventricular diastolic parameters are consistent with Grade II diastolic dysfunction (pseudonormalization). Elevated left atrial pressure.  2. Right ventricular systolic function reduced. The right ventricular size is normal.  3. Left atrial size was mildly dilated.  4. The mitral valve is degenerative. Mild mitral valve regurgitation.  5. Tricuspid valve regurgitation is moderate.  6. Native aortic valve, severely calcified, reduced leaflet excursions, peak velocity 2.2 m/s, mean gradient 9 mmHg, dimensional index 0.25, stroke-volume  index 20 (suspect low-flow low gradient severe aortic stenosis).. There  is moderate calcification of the aortic valve. There is mild thickening of the aortic valve.  7. The inferior vena cava is normal in size with greater than 50% respiratory variability, suggesting right atrial pressure of 3 mmHg. Comparison(s): Prior study 01/03/2018 per report LVEF 20-25% diffuse hypokinesis grade 2 diastolic function, mild MR, mild left atrial dilatation. Conclusion(s)/Recommendation(s): No left ventricular mural or apical thrombus/thrombi. FINDINGS  Left Ventricle: Left ventricular ejection fraction, by estimation, is 20 to 25%. The left ventricle has severely decreased function. The left ventricle demonstrates global hypokinesis. Definity  contrast agent was given IV to delineate the left ventricular endocardial borders. The left ventricular internal cavity size was dilated. There is mild left ventricular hypertrophy. Left ventricular diastolic parameters are consistent with Grade II diastolic dysfunction (pseudonormalization). Elevated left atrial pressure. Right Ventricle: The right ventricular size is normal. Right vetricular wall thickness was not well visualized. Right ventricular systolic function reduced. Left [facility]: Left atrial size was mildly dilated. Right [facility]: Right atrial size was normal in size. Pericardium: There is no evidence of pericardial effusion. Mitral Valve: The mitral valve is degenerative in appearance. There is mild calcification of the mitral valve leaflet(s). Mild to moderate mitral annular calcification. Mild mitral valve regurgitation. Tricuspid Valve: The tricuspid valve is not well visualized. Tricuspid valve regurgitation is moderate . No evidence of tricuspid stenosis. Aortic Valve: Native aortic valve, severely calcified, reduced leaflet excursions, peak velocity 2.2 m/s, mean gradient 9 mmHg, dimensional index 0.25, stroke-volume index 20 (suspect low-flow low gradient severe aortic  stenosis). There is moderate calcification of the aortic valve. There is mild thickening of the aortic valve. There is moderate to severe aortic valve annular calcification. Aortic valve mean gradient measures 9.0 mmHg. Aortic valve peak gradient measures 19.4 mmHg. Aortic valve area, by VTI measures 0.87 cm. Pulmonic Valve: The pulmonic valve was not well visualized. Pulmonic valve regurgitation is trivial. No evidence of pulmonic stenosis. Aorta: The aortic root and ascending aorta are structurally normal, with no evidence of dilitation. Venous: The inferior vena cava is normal in size with greater than 50% respiratory variability, suggesting right atrial pressure of 3 mmHg. IAS/Shunts: The interatrial septum was not well visualized. Additional Comments: A device lead is visualized.  LEFT VENTRICLE PLAX 2D LVIDd:         3.80 cm      Diastology LVIDs:         3.60 cm      LV e' medial:   1.95 cm/s LV PW:         1.20 cm      LV E/e' medial: 60.5 LV IVS:        1.20 cm LVOT diam:     2.10 cm LV SV:         38 LV SV Index:   20 LVOT Area:     3.46 cm  LV Volumes (MOD) LV vol d, MOD A2C: 134.0 ml LV vol d, MOD A4C: 199.0 ml LV vol s, MOD A2C: 108.0 ml LV vol s, MOD A4C: 161.0 ml LV SV MOD A2C:     26.0 ml LV SV MOD A4C:     199.0 ml LV SV MOD BP:      27.2 ml RIGHT VENTRICLE RV Basal diam:  4.50 cm RV Mid diam:    2.60 cm RV S prime:     7.78 cm/s TAPSE (M-mode): 1.5 cm LEFT ATRIUM             Index  RIGHT ATRIUM           Index LA diam:        5.10 cm 2.65 cm/m   RA Area:     17.70 cm LA Vol (A2C):   69.9 ml 36.28 ml/m  RA Volume:   51.90 ml  26.94 ml/m LA Vol (A4C):   77.1 ml 40.02 ml/m LA Biplane Vol: 74.6 ml 38.72 ml/m  AORTIC VALVE AV Area (Vmax):    0.97 cm AV Area (Vmean):   1.02 cm AV Area (VTI):     0.87 cm AV Vmax:           220.00 cm/s AV Vmean:          140.000 cm/s AV VTI:            0.440 m AV Peak Grad:      19.4 mmHg AV Mean Grad:      9.0 mmHg LVOT Vmax:         61.30 cm/s LVOT  Vmean:        41.400 cm/s LVOT VTI:          0.110 m LVOT/AV VTI ratio: 0.25  AORTA Ao Root diam: 2.90 cm Ao Asc diam:  2.90 cm MITRAL VALVE                TRICUSPID VALVE MV Area (PHT): 3.70 cm     TR Peak grad:   50.4 mmHg MV Decel Time: 205 msec     TR Vmax:        355.00 cm/s MR Peak grad: 71.9 mmHg MR Vmax:      424.00 cm/s   SHUNTS MV E velocity: 118.00 cm/s  Systemic VTI:  0.11 m                             Systemic Diam: 2.10 cm Sunit Tolia Electronically signed by Madonna Large Signature Date/Time: 11/27/2023/10:56:17 AM    Final    CT CHEST WO CONTRAST Result Date: 11/25/2023 CLINICAL DATA:  Abnormal xray - lung nodule, >= 1 cm Chf, sob, lung nodule EXAM: CT CHEST WITHOUT CONTRAST TECHNIQUE: Multidetector CT imaging of the chest was performed following the standard protocol without IV contrast. RADIATION DOSE REDUCTION: This exam was performed according to the departmental dose-optimization program which includes automated exposure control, adjustment of the mA and/or kV according to patient size and/or use of iterative reconstruction technique. COMPARISON:  CT angio chest 01/14/2022 FINDINGS: Cardiovascular: Left chest wall dual lead cardiac pacemaker. Normal heart size. No significant pericardial effusion. The thoracic aorta is normal in caliber. Severe atherosclerotic plaque of the thoracic aorta. Four-vessel coronary artery calcifications. Mediastinum/Nodes: No gross hilar adenopathy, noting limited sensitivity for the detection of hilar adenopathy on this noncontrast study. Enlarged mediastinal lymph nodes with as an example a precarinal 1.3 cm lymph node. No enlarged axillary lymph nodes. Thyroid gland, trachea, and esophagus demonstrate no significant findings. Small hiatal hernia. Lungs/Pleura: Mild centrilobular emphysematous changes. interlobular septal wall thickening. Diffuse bronchial wall thickening. no focal consolidation. Interval increase in size of a right upper/right lower lobe 3.7 x 4.4  cm (from 1.4 x 0.8 cm) subpleural nodule along the right major fissure with associated surrounding ground-glass airspace opacity (3 : 67). Iinterval development of a right upper lobe 1.1 cm ground-glass airspace opacity. No pulmonary mass. Bilateral small pleural effusions. No pneumothorax. Upper Abdomen: Bilateral perinephric fat stranding. Question exophytic 1.4 cm left renal lesion with  a density of 43 Hounsfield units (2:163). Severe atherosclerotic plaque. Musculoskeletal: No chest wall abnormality. No suspicious lytic or blastic osseous lesions. No acute displaced fracture. Multilevel degenerative changes of the spine. IMPRESSION: 1. Interval increase in size of a right upper/right lower lobe 3.7 x 4.4 cm (from 1.4 x 0.8 cm) subpleural nodule along the right major fissure with associated surrounding ground-glass airspace opacity. Finding concerning for malignancy. Additional imaging evaluation or consultation with Pulmonology or Thoracic Surgery recommended. 2. Mediastinal lymphadenopathy. No gross hilar adenopathy, noting limited sensitivity for the detection of hilar adenopathy on this noncontrast study. 3. Pulmonary edema with bilateral small pleural effusions. 4. Interval development of a right upper lobe 1.1 cm ground-glass airspace opacity. Initial follow-up with CT at 6 months is recommended to confirm persistence. If persistent, repeat CT is recommended every 2 years until 5 years of stability has been established. This recommendation follows the consensus statement: Guidelines for Management of Incidental Pulmonary Nodules Detected on CT Images: From the Fleischner Society 2017; Radiology 2017; 284:228-243. 5. Small hiatal hernia. 6. Indeterminate 1.5 cm left renal lesion. When the patient is clinically stable and able to follow directions and hold their breath (preferably as an outpatient) further evaluation with dedicated MRI renal protocol should be considered. 7. Aortic Atherosclerosis  (ICD10-I70.0) and Emphysema (ICD10-J43.9). Electronically Signed   By: Morgane  Naveau M.D.   On: 11/25/2023 01:19   DG Chest 2 View Result Date: 11/24/2023 CLINICAL DATA:  Shortness of breath EXAM: CHEST - 2 VIEW COMPARISON:  10/09/2022 FINDINGS: Cardiac shadow is stable. Postoperative changes are seen. Defibrillator is again noted. Focal ovoid airspace opacity is noted in the right mid lung at the superior aspect of the major fissure. This may represent mild fluid trapped in the fissure. No other focal abnormality is noted. IMPRESSION: Thickening along the superior aspect of the major fissure on the right which may represent some mild fluid trapped in the fissure. No other focal abnormality is noted. Electronically Signed   By: Oneil Devonshire M.D.   On: 11/24/2023 17:29    Microbiology: Recent Results (from the past 240 hours)  Resp panel by RT-PCR (RSV, Flu A&B, Covid) Anterior Nasal Swab     Status: None   Collection Time: 11/24/23  2:56 PM   Specimen: Anterior Nasal Swab  Result Value Ref Range Status   SARS Coronavirus 2 by RT PCR NEGATIVE NEGATIVE Final   Influenza A by PCR NEGATIVE NEGATIVE Final   Influenza B by PCR NEGATIVE NEGATIVE Final    Comment: (NOTE) The Xpert Xpress SARS-CoV-2/FLU/RSV plus assay is intended as an aid in the diagnosis of influenza from Nasopharyngeal swab specimens and should not be used as a sole basis for treatment. Nasal washings and aspirates are unacceptable for Xpert Xpress SARS-CoV-2/FLU/RSV testing.  Fact Sheet for Patients: bloggercourse.com  Fact Sheet for Healthcare Providers: seriousbroker.it  This test is not yet approved or cleared by the [place]  FDA and has been authorized for detection and/or diagnosis of SARS-CoV-2 by FDA under an Emergency Use Authorization (EUA). This EUA will remain in effect (meaning this test can be used) for the duration of the COVID-19 declaration under  Section 564(b)(1) of the Act, 21 U.S.C. section 360bbb-3(b)(1), unless the authorization is terminated or revoked.     Resp Syncytial Virus by PCR NEGATIVE NEGATIVE Final    Comment: (NOTE) Fact Sheet for Patients: bloggercourse.com  Fact Sheet for Healthcare Providers: seriousbroker.it  This test is not yet approved or cleared by the [place]   FDA and has been authorized for detection and/or diagnosis of SARS-CoV-2 by FDA under an Emergency Use Authorization (EUA). This EUA will remain in effect (meaning this test can be used) for the duration of the COVID-19 declaration under Section 564(b)(1) of the Act, 21 U.S.C. section 360bbb-3(b)(1), unless the authorization is terminated or revoked.  Performed at [facility], [address], [place], [place] [address]   Respiratory (~20 pathogens) panel by PCR     Status: None   Collection Time: 11/25/23 12:35 AM   Specimen: Nasopharyngeal Swab; Respiratory  Result Value Ref Range Status   Adenovirus NOT DETECTED NOT DETECTED Final   Coronavirus 229E NOT DETECTED NOT DETECTED Final    Comment: (NOTE) The Coronavirus on the Respiratory Panel, DOES NOT test for the novel  Coronavirus (2019 nCoV)    Coronavirus HKU1 NOT DETECTED NOT DETECTED Final   Coronavirus NL63 NOT DETECTED NOT DETECTED Final   Coronavirus OC43 NOT DETECTED NOT DETECTED Final   Metapneumovirus NOT DETECTED NOT DETECTED Final   Rhinovirus / Enterovirus NOT DETECTED NOT DETECTED Final   Influenza A NOT DETECTED NOT DETECTED Final   Influenza B NOT DETECTED NOT DETECTED Final   Parainfluenza Virus 1 NOT DETECTED NOT DETECTED Final   Parainfluenza Virus 2 NOT DETECTED NOT DETECTED Final   Parainfluenza Virus 3 NOT DETECTED NOT DETECTED Final   Parainfluenza Virus 4 NOT DETECTED NOT DETECTED Final   Respiratory Syncytial Virus NOT DETECTED NOT DETECTED Final   Bordetella pertussis NOT DETECTED NOT DETECTED  Final   Bordetella Parapertussis NOT DETECTED NOT DETECTED Final   Chlamydophila pneumoniae NOT DETECTED NOT DETECTED Final   Mycoplasma pneumoniae NOT DETECTED NOT DETECTED Final    Comment: Performed at [facility], [address] [place], [place] [address]     Labs: Basic Metabolic Panel: Recent Labs  Lab 11/24/23 1500 11/24/23 2332 11/26/23 0217 11/27/23 0229  NA 136  --  137 136  K 4.1  --  3.7 3.6  CL 101  --  104 100  CO2 21*  --  20* 26  GLUCOSE 183*  --  165* 206*  BUN 31*  --  35* 34*  CREATININE 1.99*  --  1.65* 1.73*  CALCIUM  9.1  --  9.1 9.1  MG  --  2.2  --   --   PHOS  --  3.4  --   --    Liver Function Tests: Recent Labs  Lab 11/24/23 1500  AST 33  ALT 52*  ALKPHOS 62  BILITOT 1.0  PROT 6.2*  ALBUMIN 3.3*   No results for input(s): LIPASE, AMYLASE in the last 168 hours. No results for input(s): AMMONIA in the last 168 hours. CBC: Recent Labs  Lab 11/24/23 2109  WBC 6.7  NEUTROABS 4.4  HGB 10.0*  HCT 31.6*  MCV 94.9  PLT 198   Cardiac Enzymes: Recent Labs  Lab 11/24/23 2332  CKTOTAL 91   BNP: BNP (last 3 results) Recent Labs    11/24/23 1508  BNP 1,124.0*    ProBNP (last 3 results) No results for input(s): PROBNP in the last 8760 hours.  CBG: Recent Labs  Lab 11/26/23 1138 11/26/23 1650 11/26/23 2046 11/27/23 0558 11/27/23 1037  GLUCAP 268* 243* 283* 154* 303*       Signed:  Sigurd Pac MD.  [facility] 11/27/2023, 11:15 AM

## 2023-11-27 NOTE — Plan of Care (Signed)
   Problem: Fluid Volume: Goal: Ability to maintain a balanced intake and output will improve Outcome: Progressing   Problem: Activity: Goal: Risk for activity intolerance will decrease Outcome: Progressing

## 2023-11-27 NOTE — Progress Notes (Signed)
 Patient Name: Jesse Hansen Date of Encounter: 11/27/2023 [facility] Cardiologist: None sees Dr. Waymond at the [facility].  Interval Summary  .    Denies anginal chest pain. Shortness of breath significantly improved. Wishes to be discharged home.  Vital Signs .    Vitals:   11/27/23 0718 11/27/23 0719 11/27/23 0756 11/27/23 1034  BP: (!) 88/58 (!) 90/53 (!) 103/55 120/67  Pulse: 67   92  Resp: 20   20  Temp: 98 F (36.7 C)   98.2 F (36.8 C)  TempSrc: Oral   Oral  SpO2: 100% 100%  96%  Weight:      Height:        Intake/Output Summary (Last 24 hours) at 11/27/2023 1814 Last data filed at 11/27/2023 1035 Gross per 24 hour  Intake 957 ml  Output 825 ml  Net 132 ml   Net IO Since Admission: -2,023 mL [11/27/23 1814]     11/27/2023    5:57 AM 11/25/2023    4:24 PM 11/24/2023   11:42 PM  Last 3 Weights  Weight (lbs) 157 lb 13.6 oz 162 lb 14.7 oz 160 lb 15 oz  Weight (kg) 71.6 kg 73.9 kg 73 kg      Telemetry/ECG/Cardiac studies pending    Paced rhythm- Personally Reviewed  Echo: 11/27/2023  1. Left ventricular ejection fraction, by estimation, is 20 to 25%. The  left ventricle has severely decreased function. The left ventricle  demonstrates global hypokinesis. The left ventricular internal cavity size  was dilated. There is mild left  ventricular hypertrophy. Left ventricular diastolic parameters are  consistent with Grade II diastolic dysfunction (pseudonormalization).  Elevated left atrial pressure.   2. Right ventricular systolic function reduced. The right ventricular  size is normal.   3. Left atrial size was mildly dilated.   4. The mitral valve is degenerative. Mild mitral valve regurgitation.   5. Tricuspid valve regurgitation is moderate.   6. Native aortic valve, severely calcified, reduced leaflet excursions,  peak velocity 2.2 m/s, mean gradient 9 mmHg, dimensional index 0.25,  stroke-volume index 20 (suspect low-flow low gradient severe aortic   stenosis).. There is moderate calcification  of the aortic valve. There is mild thickening of the aortic valve.   7. The inferior vena cava is normal in size with greater than 50%  respiratory variability, suggesting right atrial pressure of 3 mmHg.   Comparison(s): Prior study 01/03/2018 per report LVEF 20-25% diffuse  hypokinesis grade 2 diastolic function, mild MR, mild left atrial  dilatation.    LHC 12/2017: Ost RCA to Prox RCA lesion is 99% stenosed. Prox LAD to Mid LAD lesion is 100% stenosed. Ost 1st Diag to 1st Diag lesion is 100% stenosed. Prox Cx to Mid Cx lesion is 100% stenosed. Dist Cx lesion is 100% stenosed. SVG. Origin to Prox Graft lesion is 100% stenosed. SVG. Origin to Prox Graft lesion is 100% stenosed. LIMA graft was visualized by angiography and is normal in caliber. The graft exhibits no disease. LV end diastolic pressure is moderately elevated.   1. Severe 3 vessel occlusive CAD    - 100% proximal LAD    - 100% proximal first diagonal    - 100% mid LCx after OM1. This is a large dominant vessel    - 99% ostial nondominant RCA 2. Patent free LIMA to the LAD. This supplies collaterals to other vessels. 3. Occluded SVG to first diagonal 4. Occluded SVG to OM 5. Moderately elevated LVEDP  Plan; there are no targets for revascularization. He is dependent on free LIMA graft to the LAD. Medical management.     Physical Exam .   GEN: No acute distress.   Neck: No JVD Cardiac: Regular, positive S1-S2, systolic ejection murmur heard at the second intercostal space no rubs or gallops appreciated, pacemaker site is healed well. Respiratory: Clear to auscultation bilaterally. GI: Soft, nontender, non-distended  MS: Trace bilateral edema, warm to touch  Assessment & Plan .   78 y.o. adult with a hx of CAD status post CABG x 3 in 2013, chronic HFrEF, ischemic cardiomyopathy with BiV ICD (presyncope and long pauses/NSVT), hypertension, hyperlipidemia, diabetes,  CVA, vasculopath, PAD status post left carotid subclavian bypass 2023, agent orange syndrome, PTSD, left vertebral steal syndrome, lung cancer with right lobectomy, who is being seen during this hospitalization for CHF management per primary team.  Acute heart failure with reduced EF Ischemic cardiomyopathy status post BiV ICD  Stage C, NYHA class II Presented with dyspnea on exertion, orthopnea, and weight gain. States symptoms of dyspnea have improved significantly since hospitalization. No longer requiring nasal cannula oxygen. Transition to Lasix  40 mg p.o. daily Continue carvedilol  6.25 mg p.o. twice daily. Continue spironolactone  25 mg p.o. daily Continue losartan  25 mg p.o. daily Follow-up with outpatient cardiology at the [facility] in 2 weeks post discharge  Aortic stenosis: Severe based on hemodynamics-suggestive of low-flow low gradient AS Needs close outpatient monitoring. Patient is advised to seek medical attention if he has syncope, recurrent hospitalization for heart failure, or anginal chest pain. I have asked the patient and wife to follow-up with cardiology as outpatient to be considered for possible aortic valve replacement, i.e. TAVR  CAD status post CABG in 2013. Recent heart catheterization reports from 2019 reviewed. Medical management recommended given his CAD Denies anginal chest pain.  PAD: Follows with vascular surgery. Continue antiplatelet therapy. Continue home dose rosuvastatin  40 mg p.o. every night.  Pulmonary nodules: COPD. Management per primary team. Reemphasized the importance of following up as outpatient with PCP and/or pulmonary medicine given the CT findings.  Plan of care discussed with patient and wife at bedside.  Nursing staff updated with the plan of care as well.  For questions or updates, please contact [facility] Please consult [email or web address] for contact info under     Signed,  Madonna Large, DO, [facility]  [facility]  [address] [place], [place] [address] Pager: [phone] Office: [phone] 6:14 PM 11/27/23

## 2023-11-27 NOTE — Progress Notes (Signed)
 Echocardiogram 2D Echocardiogram has been performed.  Makeya Hilgert N Emari Hreha,RDCS 11/27/2023, 9:55 AM

## 2023-11-27 NOTE — Plan of Care (Signed)

## 2023-12-02 LAB — RENAL FUNCTION PANEL: EGFR: 32

## 2024-03-21 ENCOUNTER — Ambulatory Visit (INDEPENDENT_AMBULATORY_CARE_PROVIDER_SITE_OTHER): Payer: Self-pay | Admitting: Internal Medicine

## 2024-03-21 ENCOUNTER — Encounter: Payer: Self-pay | Admitting: Internal Medicine

## 2024-03-21 VITALS — BP 120/68 | Temp 97.6°F | Ht 72.0 in | Wt 167.0 lb

## 2024-03-21 DIAGNOSIS — R0902 Hypoxemia: Secondary | ICD-10-CM | POA: Insufficient documentation

## 2024-03-21 DIAGNOSIS — Z720 Tobacco use: Secondary | ICD-10-CM

## 2024-03-21 DIAGNOSIS — Z0001 Encounter for general adult medical examination with abnormal findings: Secondary | ICD-10-CM

## 2024-03-21 DIAGNOSIS — E782 Mixed hyperlipidemia: Secondary | ICD-10-CM

## 2024-03-21 DIAGNOSIS — Z0000 Encounter for general adult medical examination without abnormal findings: Secondary | ICD-10-CM

## 2024-03-21 DIAGNOSIS — F411 Generalized anxiety disorder: Secondary | ICD-10-CM

## 2024-03-21 DIAGNOSIS — E1121 Type 2 diabetes mellitus with diabetic nephropathy: Secondary | ICD-10-CM | POA: Insufficient documentation

## 2024-03-21 DIAGNOSIS — E119 Type 2 diabetes mellitus without complications: Secondary | ICD-10-CM

## 2024-03-21 DIAGNOSIS — I10 Essential (primary) hypertension: Secondary | ICD-10-CM

## 2024-03-21 DIAGNOSIS — R0600 Dyspnea, unspecified: Secondary | ICD-10-CM | POA: Insufficient documentation

## 2024-03-21 DIAGNOSIS — E1169 Type 2 diabetes mellitus with other specified complication: Secondary | ICD-10-CM | POA: Insufficient documentation

## 2024-03-21 DIAGNOSIS — N1832 Chronic kidney disease, stage 3b: Secondary | ICD-10-CM

## 2024-03-21 MED ORDER — LORAZEPAM 1 MG PO TABS: 2.0000 mg | ORAL_TABLET | Freq: Every evening | ORAL | 5 refills | Status: DC

## 2024-03-21 NOTE — Assessment & Plan Note (Signed)
 Lab Results  Component Value Date   HGBA1C 8.0 (H) 11/24/2023   Uncontrolled, pt declines f/u lab or change in tx today as just done at the [place]

## 2024-03-21 NOTE — Progress Notes (Signed)
 Patient ID: Jesse Hansen, adult   DOB: 03-10-1946, 78 y.o.   MRN: [id number]         Chief Complaint:: wellness exam and anxiety, ckd3b, htn, hld, smoker,        HPI:  Jesse Hansen is a 78 y.o. adult here for wellness exam; declines hep c screen, o/w up to date                        Also due for cataract surgury soon.  Pt denies chest pain, increased sob or doe, wheezing, orthopnea, PND, increased LE swelling, palpitations, dizziness or syncope.   Pt denies polydipsia, polyuria, or new focal neuro s/s.    Pt denies fever, wt loss, night sweats, loss of appetite, or other constitutional symptoms  Denies worsening depressive symptoms, suicidal ideation, or panic; has ongoing anxiety, asking lorazepam  refill at bedtime sleep and anxiety.     Wt Readings from Last 3 Encounters:  03/21/24 167 lb (75.8 kg)  11/27/23 157 lb 13.6 oz (71.6 kg)  09/29/23 161 lb (73 kg)   BP Readings from Last 3 Encounters:  03/21/24 120/68  11/27/23 120/67  09/29/23 126/74   Immunization History  Administered Date(s) Administered   Fluad Quad(high Dose 65+) 09/24/2021   Fluad Trivalent(High Dose 65+) 10/19/2017   Influenza Whole 07/27/2010   Influenza, High Dose Seasonal PF 08/28/2010, 11/04/2016, 10/12/2019, 10/12/2019, 11/23/2022, 11/23/2022, 09/22/2023   Influenza, Seasonal, Injecte, Preservative Fre 12/15/2015   Influenza-Unspecified 02/06/2002, 08/23/2003, 11/23/2003, 09/22/2004, 09/22/2005, 11/28/2009, 10/08/2011, 10/03/2014, 01/31/2015, 10/19/2017, 09/22/2018   Moderna Covid-19 Fall Seasonal Vaccine 62yrs & older 03/20/2024   Moderna Covid-19 Vaccine Bivalent Booster 43yrs & up 09/24/2021   Moderna Sars-Covid-2 Vaccination 12/19/2019, 01/16/2020, 10/31/2020, 06/11/2021   Pneumococcal Conjugate-13 09/13/2014, 04/04/2017   Pneumococcal Polysaccharide-23 11/22/2004, 05/04/2013   Pneumococcal-Unspecified 08/10/2002   Td 08/10/2002, 11/23/2003   Td (Adult),5 Lf Tetanus Toxid, Preservative Free 06/10/2022    Td (Adult),unspecified 08/10/2002   Tdap 01/23/2012   Zoster Recombinant(Shingrix) 04/10/2020, 06/12/2020, 08/20/2020   Health Maintenance Due  Topic Date Due   Hepatitis C Screening  Never done   DEXA SCAN  Never done      Past Medical History:  Diagnosis Date   Agent orange exposure    Anxiety    Carotid artery disease (HCC)    a. s/p carotid endarterectomy remotely.   Chronic systolic CHF (congestive heart failure) (HCC)    COLONIC POLYPS, HX OF 01/29/2011   Qualifier: Diagnosis of  By: Autry Legions MD, Alveda Aures    Coronary artery disease involving native coronary artery with angina pectoris [facility])    a. s/p Multiple PCIs. b. CABG x 3 in 2013 (SVG-OM1, SVG-Diag with freeLIMA-LAD from SVG-Diag hood) --> relook cath in ~2015 with at least 1 graft ~occluded by report.   Diabetes mellitus with circulatory complication [facility])    ERECTILE DYSFUNCTION 08/15/2007   Qualifier: Diagnosis of  By: Dance CMA (AAMA), Kim     Essential hypertension    Hyperlipidemia    Insomnia    LBBB (left bundle branch block)    Pneumonia    PTSD 07/30/2010   Qualifier: Diagnosis of  By: Autry Legions MD, Alveda Aures    PVD (peripheral vascular disease) (HCC)    a. s/p AOBF and LCE in the 1990s.   Past Surgical History:  Procedure Laterality Date   ABDOMINAL AORTIC ANEURYSM REPAIR  1996?   Done at [facility] by Dr. Shirley Douglas   CAROTID ENDARTERECTOMY Left  Dr. Cheryal Corp at [facility] in [place]    CAROTID PTA/STENT INTERVENTION Left 03/24/2017   Procedure: Carotid PTA/Stent Intervention;  Surgeon: Margherita Shell, MD;  Location: [facility] INVASIVE CV LAB;  Service: Cardiovascular;  Laterality: Left;   CAROTID-SUBCLAVIAN BYPASS GRAFT Left 02/05/2022   Procedure: LEFT CAROTID-SUBCLAVIAN BYPASS GRAFT;  Surgeon: Margherita Shell, MD;  Location: [facility] OR;  Service: Vascular;  Laterality: Left;   CORONARY ARTERY BYPASS GRAFT  2013   @ [facility] [place] [place] (b/c [facility] OR was contaminated) -- SVG-Diag with freeLIMA-LAD sewn to SVG  hood, SVG-OM1   CORONARY STENT PLACEMENT  2007   LEFT HEART CATH AND CORS/GRAFTS ANGIOGRAPHY N/A 01/05/2018   Procedure: LEFT HEART CATH AND CORS/GRAFTS ANGIOGRAPHY;  Surgeon: Swaziland, Peter M, MD;  Location: [facility] INVASIVE CV LAB;  Service: Cardiovascular;  Laterality: N/A;   PROSTATE BIOPSY  2009    reports that he has been smoking cigarettes. He has never been exposed to tobacco smoke. He has never used smokeless tobacco. He reports that he does not currently use alcohol . He reports that he does not use drugs. family history includes Hypertension in his father. Allergies  Allergen Reactions   Oxycodone -Acetaminophen  Shortness Of Breath and Nausea And Vomiting    Patient tolerates Tylenol    Codeine Nausea And Vomiting and Itching    Other reaction(s): Paresthesia, Finding of vomiting   Jardiance  [Empagliflozin ] Diarrhea and Nausea And Vomiting   Nicotine      Nicotine  Patch--Per Nurse "heart issues"   Niacin Other (See Comments)    unknown   Current Outpatient Medications on File Prior to Visit  Medication Sig Dispense Refill   acetaminophen  (TYLENOL ) 500 MG tablet Take 500-1,000 mg by mouth every 6 (six) hours as needed (for pain.).     albuterol  (VENTOLIN  HFA) 108 (90 Base) MCG/ACT inhaler Inhale 2 puffs into the lungs 2 (two) times daily.     amiodarone (PACERONE) 200 MG tablet Take 200 mg by mouth daily.     ammonium lactate (LAC-HYDRIN) 12 % lotion Apply 1 application. topically as needed for dry skin.     aspirin  EC 81 MG tablet Take 1 tablet (81 mg total) by mouth daily. (Patient taking differently: Take 81 mg by mouth every evening.) 90 tablet 11   carboxymethylcellulose (REFRESH PLUS) 0.5 % SOLN Place 1 drop into both eyes 3 (three) times daily as needed (dry/irritated eyes).     carvedilol  (COREG ) 6.25 MG tablet Take 6.25 mg by mouth 2 (two) times daily with a meal.     cetirizine (ZYRTEC) 10 MG tablet Take 10 mg by mouth.     Cholecalciferol  (VITAMIN D3) 50 MCG (2000 UT) TABS  Take 50 mcg by mouth every morning.     clopidogrel  (PLAVIX ) 75 MG tablet Take 75 mg by mouth daily.     Coenzyme Q10 (COQ10) 50 MG CAPS Take 50 mg by mouth every morning.     ezetimibe  (ZETIA ) 10 MG tablet Take 1 tablet by mouth daily.     furosemide  (LASIX ) 40 MG tablet Take 1 tablet (40 mg total) by mouth daily. 60 tablet 2   glipiZIDE  (GLUCOTROL ) 5 MG tablet Take 7.5 mg by mouth 2 (two) times daily before a meal. Take 30 minutes prior to meals.     insulin  glargine-yfgn (SEMGLEE) 100 UNIT/ML Pen Inject 20 Units into the skin every evening.     ipratropium (ATROVENT ) 0.03 % nasal spray Place 2 sprays into both nostrils 2 (two) times daily as needed for  rhinitis.     losartan  (COZAAR ) 25 MG tablet Take 25 mg by mouth daily.     metoprolol  succinate (TOPROL -XL) 25 MG 24 hr tablet Take 12.5 mg by mouth.     nitroGLYCERIN  (NITROSTAT ) 0.4 MG SL tablet Place 0.4 mg under the tongue every 5 (five) minutes x 3 doses as needed for chest pain.     rosuvastatin  (CRESTOR ) 40 MG tablet Take 40 mg by mouth every evening.     spironolactone  (ALDACTONE ) 25 MG tablet Take 25 mg by mouth daily. TAKE ONE TABLET BY MOUTH DAILY FOR CARDIOMYOPATHY - STOP POTASSIUM SUPPLEMENT. FOLLOW UP RENAL PANEL 5-10 DAYS AFTER STARTING SPIRONOLACTONE . FOR CARDIOMYOPATHY - STOP POTASSIUM SUPPLEMENT. FOLLOW UP RENAL PANEL 5-10 DAYS AFTER STARTING SPIRONOLACTONE .     Tiotropium Bromide-Olodaterol 2.5-2.5 MCG/ACT AERS Take by mouth.     vitamin C (ASCORBIC ACID) 500 MG tablet Take 500 mg by mouth every morning.     No current facility-administered medications on file prior to visit.        ROS:  All others reviewed and negative.  Objective        PE:  BP 120/68 (BP Location: Right Arm, Patient Position: Sitting, Cuff Size: Normal)   Temp 97.6 F (36.4 C) (Oral)   Ht 6' (1.829 m)   Wt 167 lb (75.8 kg)   BMI 22.65 kg/m                 Constitutional: Pt appears in NAD               HENT: Head: NCAT.                Right  Ear: External ear normal.                 Left Ear: External ear normal.                Eyes: . Pupils are equal, round, and reactive to light. Conjunctivae and EOM are normal               Nose: without d/c or deformity               Neck: Neck supple. Gross normal ROM               Cardiovascular: Normal rate and regular rhythm.                 Pulmonary/Chest: Effort normal and breath sounds without rales or wheezing.                Abd:  Soft, NT, ND, + BS, no organomegaly               Neurological: Pt is alert. At baseline orientation, motor grossly intact               Skin: Skin is warm. No rashes, no other new lesions, LE edema - none               Psychiatric: Pt behavior is normal without agitation   Micro: none  Cardiac tracings I have personally interpreted today:  none  Pertinent Radiological findings (summarize): none   Lab Results  Component Value Date   WBC 6.7 11/24/2023   HGB 10.0 (L) 11/24/2023   HCT 31.6 (L) 11/24/2023   PLT 198 11/24/2023   GLUCOSE 206 (H) 11/27/2023   CHOL 137 02/06/2022   TRIG 81 02/06/2022   HDL 29 (L) 02/06/2022  LDLCALC 92 02/06/2022   ALT 52 (H) 11/24/2023   AST 33 11/24/2023   NA 136 11/27/2023   K 3.6 11/27/2023   CL 100 11/27/2023   CREATININE 1.73 (H) 11/27/2023   BUN 34 (H) 11/27/2023   CO2 26 11/27/2023   TSH 3.207 11/24/2023   INR 1.1 02/03/2022   HGBA1C 8.0 (H) 11/24/2023   Assessment/Plan:  Tahjee Monday Bascom is a 78 y.o. White or Caucasian [1] adult with  has a past medical history of Agent orange exposure, Anxiety, Carotid artery disease (HCC), Chronic systolic CHF (congestive heart failure) (HCC), COLONIC POLYPS, HX OF (01/29/2011), Coronary artery disease involving native coronary artery with angina pectoris (HCC), Diabetes mellitus with circulatory complication (HCC), ERECTILE DYSFUNCTION (08/15/2007), Essential hypertension, Hyperlipidemia, Insomnia, LBBB (left bundle branch block), Pneumonia, PTSD (07/30/2010), and PVD  (peripheral vascular disease) (HCC).  Encounter for well adult exam with abnormal findings Age and sex appropriate education and counseling updated with regular exercise and diet Referrals for preventative services - none needed Immunizations addressed - none needed Smoking counseling  - none needed Evidence for depression or other mood disorder - none significant Most recent labs reviewed. I have personally reviewed and have noted: 1) the patient's medical and social history 2) The patient's current medications and supplements 3) The patient's height, weight, and BMI have been recorded in the chart   Hyperlipidemia Lab Results  Component Value Date   LDLCALC 92 02/06/2022   Uncontrolled, declines lab today as just done at [place] or change in tx   Anxiety state Ok for lorazepam  2 mg at bedtime prn refill  Essential hypertension BP Readings from Last 3 Encounters:  03/21/24 120/68  11/27/23 120/67  09/29/23 126/74   Stable, pt to continue medical treatment coreg  6.25 bid, lossrtan 25 qd   Type 2 diabetes mellitus without complications (HCC) Lab Results  Component Value Date   HGBA1C 8.0 (H) 11/24/2023   Uncontrolled, pt declines f/u lab or change in tx today as just done at the [place]   Tobacco abuse Pt counsled to quit, pt not ready  Chronic kidney disease, stage 3b (HCC) Lab Results  Component Value Date   CREATININE 1.73 (H) 11/27/2023   Stable overall, cont to avoid nephrotoxins, f/u renal at [facility]  Followup: Return in about 6 months (around 09/20/2024).  Rosalia Colonel, MD 03/21/2024 8:30 PM [facility]

## 2024-03-21 NOTE — Assessment & Plan Note (Signed)

## 2024-03-21 NOTE — Assessment & Plan Note (Signed)
 BP Readings from Last 3 Encounters:  03/21/24 120/68  11/27/23 120/67  09/29/23 126/74   Stable, pt to continue medical treatment coreg  6.25 bid, lossrtan 25 qd

## 2024-03-21 NOTE — Assessment & Plan Note (Signed)
 Lab Results  Component Value Date   LDLCALC 92 02/06/2022   Uncontrolled, declines lab today as just done at [facility] or change in tx

## 2024-03-21 NOTE — Assessment & Plan Note (Signed)
 Lab Results  Component Value Date   CREATININE 1.73 (H) 11/27/2023   Stable overall, cont to avoid nephrotoxins, f/u renal at [facility]

## 2024-03-21 NOTE — Assessment & Plan Note (Signed)
 Pt counsled to quit, pt not ready

## 2024-03-21 NOTE — Patient Instructions (Signed)
 Please continue all other medications as before, and refills have been done if requested - lorazepam   Please have the pharmacy call with any other refills you may need.  Please continue your efforts at being more active, low cholesterol diet, and weight control.  You are otherwise up to date with prevention measures today.  Please keep your appointments with your specialists as you may have planned  Please make an Appointment to return in 6 months, or sooner if needed

## 2024-03-21 NOTE — Assessment & Plan Note (Signed)
 Ok for lorazepam  2 mg at bedtime prn refill

## 2024-08-03 LAB — LAB REPORT - SCANNED
A1c: 7.7
Creatinine, POC: 46.1 mg/dL
EGFR: 27
Microalb Creat Ratio: 75.9
Microalbumin, Urine: 3.5

## 2024-08-17 ENCOUNTER — Telehealth: Payer: Self-pay

## 2024-08-17 NOTE — Telephone Encounter (Signed)
 Can we drop the CPE code?  Copied from CRM #[id number]. Topic: General - Billing Inquiry >> Aug 16, 2024  2:06 PM Robinson H wrote: Reason for CRM: Patients wife states she wants to dispute the charge for the visit on 4/30, states she spoke with billing and was told he received bill for $359.00 because visit was coded as a physical. Patients wife states he didn't have a physical went in to renew prescriptions. Billing told wife to reach out to office.  Ted [phone] Okay to leave voicemail message

## 2024-09-18 ENCOUNTER — Other Ambulatory Visit: Payer: Self-pay | Admitting: Internal Medicine

## 2024-09-19 ENCOUNTER — Telehealth: Payer: Self-pay

## 2024-09-19 NOTE — Telephone Encounter (Signed)
 Copied from CRM [id number]. Topic: Clinical - Prescription Issue >> Sep 18, 2024  2:19 PM Lauren C wrote: Reason for CRM: FYI- Pt calling to check on lorazepam  refill. Refill request received today. He says he needs it asap because he only has enough for tonight.

## 2024-09-20 NOTE — Telephone Encounter (Signed)
Refill was sent in yesterday

## 2024-10-02 ENCOUNTER — Encounter: Payer: Self-pay | Admitting: Internal Medicine

## 2024-10-02 ENCOUNTER — Ambulatory Visit: Admitting: Internal Medicine

## 2024-10-02 VITALS — BP 126/78 | HR 67 | Temp 97.9°F | Ht 72.0 in | Wt 180.0 lb

## 2024-10-02 DIAGNOSIS — Z87891 Personal history of nicotine dependence: Secondary | ICD-10-CM

## 2024-10-02 DIAGNOSIS — F4310 Post-traumatic stress disorder, unspecified: Secondary | ICD-10-CM

## 2024-10-02 DIAGNOSIS — E119 Type 2 diabetes mellitus without complications: Secondary | ICD-10-CM

## 2024-10-02 DIAGNOSIS — E782 Mixed hyperlipidemia: Secondary | ICD-10-CM

## 2024-10-02 DIAGNOSIS — E1151 Type 2 diabetes mellitus with diabetic peripheral angiopathy without gangrene: Secondary | ICD-10-CM

## 2024-10-02 DIAGNOSIS — Z7985 Long-term (current) use of injectable non-insulin antidiabetic drugs: Secondary | ICD-10-CM

## 2024-10-02 DIAGNOSIS — Z7901 Long term (current) use of anticoagulants: Secondary | ICD-10-CM | POA: Insufficient documentation

## 2024-10-02 DIAGNOSIS — K5900 Constipation, unspecified: Secondary | ICD-10-CM | POA: Insufficient documentation

## 2024-10-02 DIAGNOSIS — I10 Essential (primary) hypertension: Secondary | ICD-10-CM

## 2024-10-02 DIAGNOSIS — I480 Paroxysmal atrial fibrillation: Secondary | ICD-10-CM | POA: Insufficient documentation

## 2024-10-02 MED ORDER — LORAZEPAM 1 MG PO TABS: 2.0000 mg | ORAL_TABLET | Freq: Every evening | ORAL | 5 refills | Status: AC

## 2024-10-02 NOTE — Progress Notes (Unsigned)
 Patient ID: Jesse Hansen, adult   DOB: 1946/01/11, 78 y.o.   MRN: [id number]        Chief Complaint: follow up anxiety, DM, htn, former smoker, hld       HPI:  Jesse Hansen is a 78 y.o. adult here overall doing ok with wife; Pt denies chest pain, increased sob or doe, wheezing, orthopnea, PND, increased LE swelling, palpitations, dizziness or syncope.   Pt denies polydipsia, polyuria, or new focal neuro s/s.    Pt denies fever, wt loss, night sweats, loss of appetite, or other constitutional symptoms  Has been able to quit smoking last yr Doing rehab now at [facility] hospital.  Denies worsening depressive symptoms, suicidal ideation, or panic; has ongoing anxiety, asking for lorazepam  refill, to which he not abused       Wt Readings from Last 3 Encounters:  10/02/24 180 lb (81.6 kg)  03/21/24 167 lb (75.8 kg)  11/27/23 157 lb 13.6 oz (71.6 kg)   BP Readings from Last 3 Encounters:  10/02/24 126/78  03/21/24 120/68  11/27/23 120/67         Past Medical History:  Diagnosis Date   Agent orange exposure    Anxiety    Carotid artery disease    a. s/p carotid endarterectomy remotely.   Chronic systolic CHF (congestive heart failure) (HCC)    COLONIC POLYPS, HX OF 01/29/2011   Qualifier: Diagnosis of  By: Norleen MD, Lynwood ORN    Coronary artery disease involving native coronary artery with angina pectoris    a. s/p Multiple PCIs. b. CABG x 3 in 2013 (SVG-OM1, SVG-Diag with freeLIMA-LAD from SVG-Diag hood) --> relook cath in ~2015 with at least 1 graft ~occluded by report.   Diabetes mellitus with circulatory complication [facility])    ERECTILE DYSFUNCTION 08/15/2007   Qualifier: Diagnosis of  By: Dance CMA (AAMA), Kim     Essential hypertension    Hyperlipidemia    Insomnia    LBBB (left bundle branch block)    Pneumonia    PTSD 07/30/2010   Qualifier: Diagnosis of  By: Norleen MD, Lynwood ORN    PVD (peripheral vascular disease)    a. s/p AOBF and LCE in the 1990s.   Past Surgical History:  Procedure  Laterality Date   ABDOMINAL AORTIC ANEURYSM REPAIR  1996?   Done at [facility] by Dr. Oris   CAROTID ENDARTERECTOMY Left    Dr. Darron at [facility] in [place]    CAROTID PTA/STENT INTERVENTION Left 03/24/2017   Procedure: Carotid PTA/Stent Intervention;  Surgeon: Gaile ORN New, MD;  Location: [facility] INVASIVE CV LAB;  Service: Cardiovascular;  Laterality: Left;   CAROTID-SUBCLAVIAN BYPASS GRAFT Left 02/05/2022   Procedure: LEFT CAROTID-SUBCLAVIAN BYPASS GRAFT;  Surgeon: New Gaile ORN, MD;  Location: [facility] OR;  Service: Vascular;  Laterality: Left;   CORONARY ARTERY BYPASS GRAFT  2013   @ [email or web address] [place], [place] (b/c [facility] OR was contaminated) -- SVG-Diag with freeLIMA-LAD sewn to SVG hood, SVG-OM1   CORONARY STENT PLACEMENT  2007   LEFT HEART CATH AND CORS/GRAFTS ANGIOGRAPHY N/A 01/05/2018   Procedure: LEFT HEART CATH AND CORS/GRAFTS ANGIOGRAPHY;  Surgeon: Jordan, Peter M, MD;  Location: [facility] INVASIVE CV LAB;  Service: Cardiovascular;  Laterality: N/A;   PROSTATE BIOPSY  2009    reports that he has been smoking cigarettes. He has never been exposed to tobacco smoke. He has never used smokeless tobacco. He reports that he does not currently use alcohol . He  reports that he does not use drugs. family history includes Hypertension in his father. Allergies  Allergen Reactions   Oxycodone -Acetaminophen  Shortness Of Breath and Nausea And Vomiting    Patient tolerates Tylenol    Codeine Nausea And Vomiting and Itching    Other reaction(s): Paresthesia, Finding of vomiting   Jardiance  [Empagliflozin ] Diarrhea and Nausea And Vomiting   Nicotine      Nicotine  Patch--Per Nurse heart issues   Niacin Other (See Comments)    unknown   Current Outpatient Medications on File Prior to Visit  Medication Sig Dispense Refill   acetaminophen  (TYLENOL ) 500 MG tablet Take 500-1,000 mg by mouth every 6 (six) hours as needed (for pain.).     albuterol  (VENTOLIN  HFA) 108 (90 Base) MCG/ACT inhaler Inhale 2  puffs into the lungs 2 (two) times daily.     Alirocumab 150 MG/ML SOAJ Inject into the skin.     amiodarone (PACERONE) 200 MG tablet Take 200 mg by mouth daily.     ammonium lactate (LAC-HYDRIN) 12 % lotion Apply 1 application. topically as needed for dry skin.     apixaban (ELIQUIS) 2.5 MG TABS tablet Take 2.5 mg by mouth.     aspirin  EC 81 MG tablet Take 1 tablet (81 mg total) by mouth daily. (Patient taking differently: Take 81 mg by mouth every evening.) 90 tablet 11   carboxymethylcellulose (REFRESH PLUS) 0.5 % SOLN Place 1 drop into both eyes 3 (three) times daily as needed (dry/irritated eyes).     carvedilol  (COREG ) 6.25 MG tablet Take 6.25 mg by mouth 2 (two) times daily with a meal.     cetirizine (ZYRTEC) 10 MG tablet Take 10 mg by mouth.     Cholecalciferol  (VITAMIN D3) 50 MCG (2000 UT) TABS Take 50 mcg by mouth every morning.     clopidogrel  (PLAVIX ) 75 MG tablet Take 75 mg by mouth daily.     Coenzyme Q10 (COQ10) 50 MG CAPS Take 50 mg by mouth every morning.     ezetimibe  (ZETIA ) 10 MG tablet Take 1 tablet by mouth daily.     furosemide  (LASIX ) 40 MG tablet Take 1 tablet (40 mg total) by mouth daily. 60 tablet 2   glipiZIDE  (GLUCOTROL ) 5 MG tablet Take 7.5 mg by mouth 2 (two) times daily before a meal. Take 30 minutes prior to meals.     insulin  glargine-yfgn (SEMGLEE) 100 UNIT/ML Pen Inject 20 Units into the skin every evening.     ipratropium (ATROVENT ) 0.03 % nasal spray Place 2 sprays into both nostrils 2 (two) times daily as needed for rhinitis.     lidocaine  (LIDODERM ) 5 % Place onto the skin.     losartan  (COZAAR ) 25 MG tablet Take 25 mg by mouth daily.     metoprolol  succinate (TOPROL -XL) 25 MG 24 hr tablet Take 12.5 mg by mouth.     nitroGLYCERIN  (NITROSTAT ) 0.4 MG SL tablet Place 0.4 mg under the tongue every 5 (five) minutes x 3 doses as needed for chest pain.     rosuvastatin  (CRESTOR ) 40 MG tablet Take 40 mg by mouth every evening.     spironolactone  (ALDACTONE ) 25  MG tablet Take 25 mg by mouth daily. TAKE ONE TABLET BY MOUTH DAILY FOR CARDIOMYOPATHY - STOP POTASSIUM SUPPLEMENT. FOLLOW UP RENAL PANEL 5-10 DAYS AFTER STARTING SPIRONOLACTONE . FOR CARDIOMYOPATHY - STOP POTASSIUM SUPPLEMENT. FOLLOW UP RENAL PANEL 5-10 DAYS AFTER STARTING SPIRONOLACTONE .     Tiotropium Bromide-Olodaterol 2.5-2.5 MCG/ACT AERS Take by mouth.     vitamin  C (ASCORBIC ACID) 500 MG tablet Take 500 mg by mouth every morning.     No current facility-administered medications on file prior to visit.        ROS:  All others reviewed and negative.  Objective        PE:  BP 126/78 (BP Location: Right Arm, Patient Position: Sitting, Cuff Size: Normal)   Pulse 67   Temp 97.9 F (36.6 C) (Oral)   Ht 6' (1.829 m)   Wt 180 lb (81.6 kg)   SpO2 98%   BMI 24.41 kg/m                 Constitutional: Pt appears in NAD               HENT: Head: NCAT.                Right Ear: External ear normal.                 Left Ear: External ear normal.                Eyes: . Pupils are equal, round, and reactive to light. Conjunctivae and EOM are normal               Nose: without d/c or deformity               Neck: Neck supple. Gross normal ROM               Cardiovascular: Normal rate and regular rhythm.                 Pulmonary/Chest: Effort normal and breath sounds without rales or wheezing.                Abd:  Soft, NT, ND, + BS, no organomegaly               Neurological: Pt is alert. At baseline orientation, motor grossly intact               Skin: Skin is warm. No rashes, no other new lesions, LE edema - none               Psychiatric: Pt behavior is normal without agitation , nervous  Micro: none  Cardiac tracings I have personally interpreted today:  none  Pertinent Radiological findings (summarize): none   Lab Results  Component Value Date   WBC 6.7 11/24/2023   HGB 10.0 (L) 11/24/2023   HCT 31.6 (L) 11/24/2023   PLT 198 11/24/2023   GLUCOSE 206 (H) 11/27/2023   CHOL 137  02/06/2022   TRIG 81 02/06/2022   HDL 29 (L) 02/06/2022   LDLCALC 92 02/06/2022   ALT 52 (H) 11/24/2023   AST 33 11/24/2023   NA 136 11/27/2023   K 3.6 11/27/2023   CL 100 11/27/2023   CREATININE 1.73 (H) 11/27/2023   BUN 34 (H) 11/27/2023   CO2 26 11/27/2023   TSH 3.207 11/24/2023   INR 1.1 02/03/2022   HGBA1C 8.0 (H) 11/24/2023   Assessment/Plan:  Eames Dibiasio Lada is a 78 y.o. White or Caucasian [1] adult with  has a past medical history of Agent orange exposure, Anxiety, Carotid artery disease, Chronic systolic CHF (congestive heart failure) (HCC), COLONIC POLYPS, HX OF (01/29/2011), Coronary artery disease involving native coronary artery with angina pectoris, Diabetes mellitus with circulatory complication (HCC), ERECTILE DYSFUNCTION (08/15/2007), Essential hypertension, Hyperlipidemia, Insomnia, LBBB (left bundle branch block), Pneumonia, PTSD (07/30/2010),  and PVD (peripheral vascular disease).  Diabetes mellitus with peripheral vascular disease (HCC) With PAD  Pt brings recent [facility] with A1 7.7  Mild uncontrolled for age, pt to continue current medical treatment glucotrol  xl 5 every day, semglee 20 u every day, declines other change for now    Neurosis, posttraumatic Stable overall, cont lorazepam  1 mg prn asd, pt declines other med change for now  Hyperlipidemia Pt brings [facility] record with LDL 60  Stable, pt to continue current statin crestor  40 mg every day, zetia  10 mg qd   Former smoker Pt encouraged to continue abstinence  Essential hypertension BP Readings from Last 3 Encounters:  10/02/24 126/78  03/21/24 120/68  11/27/23 120/67   Stable, pt to continue medical treatment coreg  6.25 bid, losartan  25 mg every day, toprol  xl 12.5 qd  Followup: Return in about 6 months (around 04/01/2025).  Lynwood Rush, MD 10/05/2024 1:01 PM [facility]

## 2024-10-05 ENCOUNTER — Encounter: Payer: Self-pay | Admitting: Internal Medicine

## 2024-10-05 NOTE — Assessment & Plan Note (Signed)
 Pt encouraged to continue abstinence

## 2024-10-05 NOTE — Patient Instructions (Signed)
 Please continue all other medications as before, and refills have been done if requested.  Please have the pharmacy call with any other refills you may need.  Please continue your efforts at being more active, low cholesterol diet, and weight control.  Please keep your appointments with your specialists as you may have planned  Wee can hold on further lab testing today

## 2024-10-05 NOTE — Assessment & Plan Note (Signed)
 Stable overall, cont lorazepam  1 mg prn asd, pt declines other med change for now

## 2024-10-05 NOTE — Assessment & Plan Note (Addendum)
 With PAD  Pt brings recent [facility] record with A1 7.7  Mild uncontrolled for age, pt to continue current medical treatment glucotrol  xl 5 every day, semglee 20 u every day, declines other change for now

## 2024-10-05 NOTE — Assessment & Plan Note (Signed)
 BP Readings from Last 3 Encounters:  10/02/24 126/78  03/21/24 120/68  11/27/23 120/67   Stable, pt to continue medical treatment coreg  6.25 bid, losartan  25 mg every day, toprol  xl 12.5 qd

## 2024-10-05 NOTE — Assessment & Plan Note (Signed)
 Pt brings [facility] record with LDL 60  Stable, pt to continue current statin crestor  40 mg every day, zetia  10 mg qd

## 2024-11-02 ENCOUNTER — Inpatient Hospital Stay (HOSPITAL_BASED_OUTPATIENT_CLINIC_OR_DEPARTMENT_OTHER)
Admission: EM | Admit: 2024-11-02 | Discharge: 2024-11-04 | DRG: 683 | Disposition: A | Discharge status: Home Health | Attending: Family Medicine | Admitting: Family Medicine

## 2024-11-02 ENCOUNTER — Other Ambulatory Visit: Payer: Self-pay

## 2024-11-02 ENCOUNTER — Encounter (HOSPITAL_BASED_OUTPATIENT_CLINIC_OR_DEPARTMENT_OTHER): Payer: Self-pay | Admitting: Emergency Medicine

## 2024-11-02 DIAGNOSIS — E860 Dehydration: Secondary | ICD-10-CM

## 2024-11-02 DIAGNOSIS — I2510 Atherosclerotic heart disease of native coronary artery without angina pectoris: Secondary | ICD-10-CM

## 2024-11-02 DIAGNOSIS — E785 Hyperlipidemia, unspecified: Secondary | ICD-10-CM | POA: Diagnosis present

## 2024-11-02 DIAGNOSIS — N1832 Chronic kidney disease, stage 3b: Secondary | ICD-10-CM | POA: Diagnosis present

## 2024-11-02 DIAGNOSIS — I10 Essential (primary) hypertension: Secondary | ICD-10-CM | POA: Diagnosis present

## 2024-11-02 DIAGNOSIS — I480 Paroxysmal atrial fibrillation: Secondary | ICD-10-CM | POA: Diagnosis present

## 2024-11-02 DIAGNOSIS — E119 Type 2 diabetes mellitus without complications: Secondary | ICD-10-CM

## 2024-11-02 DIAGNOSIS — Z8673 Personal history of transient ischemic attack (TIA), and cerebral infarction without residual deficits: Secondary | ICD-10-CM

## 2024-11-02 DIAGNOSIS — F4310 Post-traumatic stress disorder, unspecified: Secondary | ICD-10-CM | POA: Diagnosis present

## 2024-11-02 LAB — CBC WITH DIFFERENTIAL/PLATELET
Abs Immature Granulocytes: 0.03 K/uL (ref 0.00–0.07)
Basophils Absolute: 0 K/uL (ref 0.0–0.1)
Basophils Relative: 1 %
Eosinophils Absolute: 0.2 K/uL (ref 0.0–0.5)
Eosinophils Relative: 3 %
HCT: 38.5 % — ABNORMAL LOW (ref 39.0–52.0)
Hemoglobin: 12.6 g/dL — ABNORMAL LOW (ref 13.0–17.0)
Immature Granulocytes: 0 %
Lymphocytes Relative: 28 %
Lymphs Abs: 2.4 K/uL (ref 0.7–4.0)
MCH: 29.3 pg (ref 26.0–34.0)
MCHC: 32.7 g/dL (ref 30.0–36.0)
MCV: 89.5 fL (ref 80.0–100.0)
Monocytes Absolute: 0.8 K/uL (ref 0.1–1.0)
Monocytes Relative: 9 %
Neutro Abs: 5.2 K/uL (ref 1.7–7.7)
Neutrophils Relative %: 59 %
Platelets: 182 K/uL (ref 150–400)
RBC: 4.3 MIL/uL (ref 4.22–5.81)
RDW: 17.7 % — ABNORMAL HIGH (ref 11.5–15.5)
WBC: 8.7 K/uL (ref 4.0–10.5)
nRBC: 0 % (ref 0.0–0.2)

## 2024-11-02 LAB — BASIC METABOLIC PANEL WITH GFR
Anion gap: 16 — ABNORMAL HIGH (ref 5–15)
BUN: 54 mg/dL — ABNORMAL HIGH (ref 8–23)
CO2: 24 mmol/L (ref 22–32)
Calcium: 9.8 mg/dL (ref 8.9–10.3)
Chloride: 95 mmol/L — ABNORMAL LOW (ref 98–111)
Creatinine, Ser: 3.46 mg/dL — ABNORMAL HIGH (ref 0.61–1.24)
GFR, Estimated: 17 mL/min — ABNORMAL LOW (ref >60)
Glucose, Bld: 216 mg/dL — ABNORMAL HIGH (ref 70–99)
Potassium: 4.2 mmol/L (ref 3.5–5.1)
Sodium: 135 mmol/L (ref 135–145)

## 2024-11-02 LAB — URINALYSIS, ROUTINE W REFLEX MICROSCOPIC
Bilirubin Urine: NEGATIVE
Glucose, UA: NEGATIVE mg/dL
Ketones, ur: NEGATIVE mg/dL
Leukocytes,Ua: NEGATIVE
Nitrite: NEGATIVE
Protein, ur: 100 mg/dL — AB
Specific Gravity, Urine: 1.015 (ref 1.005–1.030)
pH: 6 (ref 5.0–8.0)

## 2024-11-02 LAB — URINALYSIS, MICROSCOPIC (REFLEX): WBC, UA: NONE SEEN WBC/hpf (ref 0–5)

## 2024-11-02 MED ORDER — SODIUM CHLORIDE 0.9 % IV BOLUS
1000.0000 mL | Freq: Once | INTRAVENOUS | Status: AC
  Administered 2024-11-02: 1000 mL via INTRAVENOUS

## 2024-11-02 NOTE — ED Provider Notes (Signed)
  EMERGENCY DEPARTMENT AT [facility] Provider Note  CSN: [id number] Arrival date & time: 11/02/24 1746  Chief Complaint(s) Abnormal Labs  HPI Jesse Hansen is a 78 y.o. male with past medical history as below, significant for systolic heart failure, CAD, DM, PTSD, PVD, paroxysmal atrial fibrillation on Eliquis who presents to the ED with complaint of abnormal labs  Patient was seen at the [facility] earlier, he had routine labs drawn and was directed to come to the ER given concern for profound dehydration.  Reports his Lasix  was recently increased around a week or so ago.  He has been urinating frequently since then.  He has been tolerant p.o. that difficulty, no vomiting or nausea, no diarrhea.  No chest pain, dyspnea, abdominal pain.  He has been compliant with his regular medications but did inadvertently take an extra dose of his blood pressure medication this morning.  He is accompanied by his spouse  Past Medical History Past Medical History:  Diagnosis Date   Agent orange exposure    Anxiety    Carotid artery disease    a. s/p carotid endarterectomy remotely.   Chronic systolic CHF (congestive heart failure) (HCC)    COLONIC POLYPS, HX OF 01/29/2011   Qualifier: Diagnosis of  By: Norleen MD, Lynwood ORN    Coronary artery disease involving native coronary artery with angina pectoris    a. s/p Multiple PCIs. b. CABG x 3 in 2013 (SVG-OM1, SVG-Diag with freeLIMA-LAD from SVG-Diag hood) --> relook cath in ~2015 with at least 1 graft ~occluded by report.   Diabetes mellitus with circulatory complication [facility])    ERECTILE DYSFUNCTION 08/15/2007   Qualifier: Diagnosis of  By: Dance CMA (AAMA), Kim     Essential hypertension    Hyperlipidemia    Insomnia    LBBB (left bundle branch block)    Pneumonia    PTSD 07/30/2010   Qualifier: Diagnosis of  By: Norleen MD, Lynwood ORN    PVD (peripheral vascular disease)    a. s/p AOBF and LCE in the 1990s.   Patient Active Problem List    Diagnosis Date Noted   Constipation, unspecified 10/02/2024   Long term (current) use of anticoagulants 10/02/2024   Paroxysmal atrial fibrillation (HCC) 10/02/2024   Dyspnea, unspecified 03/21/2024   Hypoxemia 03/21/2024   Type 2 diabetes mellitus with diabetic nephropathy (HCC) 03/21/2024   PAD (peripheral artery disease) 11/26/2023   Hx of CABG 11/26/2023   Atherosclerosis of native coronary artery of native heart without angina pectoris 11/26/2023   Normocytic anemia 11/25/2023   History of stroke 11/25/2023   Acute on chronic systolic CHF (congestive heart failure) (HCC) 11/24/2023   Acute combined systolic (congestive) and diastolic (congestive) heart failure (HCC) 09/29/2023   Cardiomyopathy, unspecified (HCC) 09/29/2023   Combined forms of age-related cataract, left eye 09/29/2023   Low back pain, unspecified 09/29/2023   Presence of cardiac resynchronization therapy defibrillator (CRT-D) 09/26/2023   Cancer of lower lobe of right lung (HCC) 04/13/2023   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   Abrasion of other part of head, initial encounter 04/27/2021   Actinic keratosis 04/27/2021   Adenomatous polyp of colon 04/27/2021   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   Chronic kidney disease, stage 3b (HCC) 04/27/2021   Drug-induced mental disorder (HCC) 04/27/2021   Multiple nodules of lung 04/27/2021   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   Occlusion and stenosis of carotid artery 04/27/2021   Postsurgical percutaneous transluminal coronary angioplasty status 04/27/2021  Skin cancer 04/27/2021   Syncope and collapse 04/27/2021   Transient cerebral ischemia 04/27/2021   Vasomotor rhinitis 04/27/2021   Encounter for screening for malignant neoplasm of respiratory organs 04/27/2021   Other specified counseling 04/27/2021   Encounter for well adult exam with abnormal findings 04/27/2021   Chest pain 06/23/2020   Non-ST elevation (NSTEMI) myocardial  infarction [facility])    Former smoker 01/03/2018   Influenza 01/03/2018   Hypokalemia 01/03/2018   Hypotension 01/03/2018   AKI (acute kidney injury) 01/02/2018   Carotid stenosis 03/24/2017   Generalized ischemic myocardial dysfunction 01/27/2016   Aortic valve stenosis 01/27/2016   CAP (community acquired pneumonia) 01/26/2016   COPD with acute exacerbation (HCC) 01/26/2016   Acute congestive heart failure (HCC) 01/26/2016   History of coronary artery bypass surgery 01/26/2016   Agent orange exposure 01/26/2016   Left bundle branch block (LBBB) 01/26/2016   Stable angina 01/26/2016   Accidental exposure to phenoxyacid derivative herbicide 01/26/2016   Elevated troponin 01/25/2016   Insomnia 03/02/2013   Encounter for immunization 02/28/2012   Diabetes mellitus with peripheral vascular disease (HCC) 01/29/2011   History of colonic polyps 01/29/2011   Diabetes mellitus (HCC) 01/29/2011   Neurosis, posttraumatic 07/30/2010   Anxiety state 08/23/2009   Hyperlipidemia 02/23/2008   Atherosclerotic heart disease of native coronary artery with other forms of angina pectoris 02/23/2008   Peripheral vascular disease (HCC) 02/23/2008   Coronary artery abnormality 02/23/2008   ERECTILE DYSFUNCTION 08/15/2007   Essential hypertension 08/15/2007   Home Medication(s) Prior to Admission medications  Medication Sig Start Date End Date Taking? Authorizing Provider  acetaminophen  (TYLENOL ) 500 MG tablet Take 500-1,000 mg by mouth every 6 (six) hours as needed (for pain.).    [provider]  albuterol  (VENTOLIN  HFA) 108 (90 Base) MCG/ACT inhaler Inhale 2 puffs into the lungs 2 (two) times daily.    [provider]  Alirocumab 150 MG/ML SOAJ Inject into the skin. 05/31/24   [provider]  amiodarone (PACERONE) 200 MG tablet Take 200 mg by mouth daily.    [provider]  ammonium lactate (LAC-HYDRIN) 12 % lotion Apply 1 application. topically as needed for dry  skin. 09/17/20   [provider]  apixaban (ELIQUIS) 2.5 MG TABS tablet Take 2.5 mg by mouth. 08/13/24   [provider]  aspirin  EC 81 MG tablet Take 1 tablet (81 mg total) by mouth daily. Patient taking differently: Take 81 mg by mouth every evening. 05/15/14   [name] ORN, MD  carboxymethylcellulose (REFRESH PLUS) 0.5 % SOLN Place 1 drop into both eyes 3 (three) times daily as needed (dry/irritated eyes). 03/26/21   [provider]  carvedilol  (COREG ) 6.25 MG tablet Take 6.25 mg by mouth 2 (two) times daily with a meal.    [provider]  cetirizine (ZYRTEC) 10 MG tablet Take 10 mg by mouth. 03/06/24   [provider]  Cholecalciferol  (VITAMIN D3) 50 MCG (2000 UT) TABS Take 50 mcg by mouth every morning.    [provider]  clopidogrel  (PLAVIX ) 75 MG tablet Take 75 mg by mouth daily.    [provider]  Coenzyme Q10 (COQ10) 50 MG CAPS Take 50 mg by mouth every morning.    [provider]  ezetimibe  (ZETIA ) 10 MG tablet Take 1 tablet by mouth daily. 03/13/24   [provider]  furosemide  (LASIX ) 40 MG tablet Take 1 tablet (40 mg total) by mouth daily. 06/24/20   McDaniel, Jill D, NP  glipiZIDE  (GLUCOTROL ) 5 MG tablet Take 7.5 mg by mouth 2 (two) times daily before a meal. Take 30 minutes prior to meals.    [provider]  insulin  glargine-yfgn (SEMGLEE) 100 UNIT/ML Pen Inject 20 Units into the skin every evening.    [provider]  ipratropium (ATROVENT ) 0.03 % nasal spray Place 2 sprays into both nostrils 2 (two) times daily as needed for rhinitis.    [provider]  lidocaine  (LIDODERM ) 5 % Place onto the skin. 06/01/24   [provider]  LORazepam  (ATIVAN ) 1 MG tablet Take 2 tablets (2 mg total) by mouth at bedtime. 10/02/24   [name] ORN, MD  losartan  (COZAAR ) 25 MG tablet Take 25 mg by mouth daily. 10/09/23   [provider]  metoprolol  succinate (TOPROL -XL) 25 MG 24  hr tablet Take 12.5 mg by mouth. 12/28/23   [provider]  nitroGLYCERIN  (NITROSTAT ) 0.4 MG SL tablet Place 0.4 mg under the tongue every 5 (five) minutes x 3 doses as needed for chest pain.    [provider]  rosuvastatin  (CRESTOR ) 40 MG tablet Take 40 mg by mouth every evening.    [provider]  spironolactone  (ALDACTONE ) 25 MG tablet Take 25 mg by mouth daily. TAKE ONE TABLET BY MOUTH DAILY FOR CARDIOMYOPATHY - STOP POTASSIUM SUPPLEMENT. FOLLOW UP RENAL PANEL 5-10 DAYS AFTER STARTING SPIRONOLACTONE . FOR CARDIOMYOPATHY - STOP POTASSIUM SUPPLEMENT. FOLLOW UP RENAL PANEL 5-10 DAYS AFTER STARTING SPIRONOLACTONE . 10/09/23   [provider]  Tiotropium Bromide-Olodaterol 2.5-2.5 MCG/ACT AERS Take by mouth. 01/23/24   [provider]  vitamin C (ASCORBIC ACID) 500 MG tablet Take 500 mg by mouth every morning.    [provider]                                                                                                                                    Past Surgical History Past Surgical History:  Procedure Laterality Date   ABDOMINAL AORTIC ANEURYSM REPAIR  1996?   Done at [facility] by Dr. Oris   CAROTID ENDARTERECTOMY Left    Dr. Darron at [facility] in [place]    CAROTID PTA/STENT INTERVENTION Left 03/24/2017   Procedure: Carotid PTA/Stent Intervention;  Surgeon: Gaile ORN New, MD;  Location: [facility] INVASIVE CV LAB;  Service: Cardiovascular;  Laterality: Left;   CAROTID-SUBCLAVIAN BYPASS GRAFT Left 02/05/2022   Procedure: LEFT CAROTID-SUBCLAVIAN BYPASS GRAFT;  Surgeon: New Gaile ORN, MD;  Location: [facility] OR;  Service: Vascular;  Laterality: Left;   CORONARY ARTERY BYPASS GRAFT  2013   @ [facility] [place], [place] (b/c [facility] OR was contaminated) -- SVG-Diag with freeLIMA-LAD sewn to SVG hood, SVG-OM1   CORONARY STENT PLACEMENT  2007   LEFT HEART CATH AND CORS/GRAFTS ANGIOGRAPHY N/A 01/05/2018   Procedure: LEFT HEART CATH AND CORS/GRAFTS  ANGIOGRAPHY;  Surgeon: Jordan, Peter M, MD;  Location: [facility] INVASIVE CV LAB;  Service:  Cardiovascular;  Laterality: N/A;   PROSTATE BIOPSY  2009   Family History Family History  Problem Relation Age of Onset   Hypertension Father     Social History Social History[1] Allergies Oxycodone -acetaminophen , Codeine, Jardiance  [empagliflozin ], Nicotine , and Niacin  Review of Systems A thorough review of systems was obtained and all systems are negative except as noted in the HPI and PMH.   Physical Exam Vital Signs  I have reviewed the triage vital signs BP 103/61 (BP Location: Right Arm)   Pulse 71   Temp 97.8 F (36.6 C) (Oral)   Resp 10   SpO2 100%  Physical Exam Vitals and nursing note reviewed.  Constitutional:      General: He is not in acute distress.    Appearance: He is well-developed.  HENT:     Head: Normocephalic and atraumatic.     Right Ear: External ear normal.     Left Ear: External ear normal.     Mouth/Throat:     Mouth: Mucous membranes are dry.  Eyes:     General: No scleral icterus. Cardiovascular:     Rate and Rhythm: Normal rate and regular rhythm.     Pulses: Normal pulses.     Heart sounds: Normal heart sounds.  Pulmonary:     Effort: Pulmonary effort is normal. No respiratory distress.     Breath sounds: Normal breath sounds.  Abdominal:     General: Abdomen is flat.     Palpations: Abdomen is soft.     Tenderness: There is no abdominal tenderness.  Musculoskeletal:     Cervical back: No rigidity.     Right lower leg: No edema.     Left lower leg: No edema.  Skin:    General: Skin is warm and dry.     Capillary Refill: Capillary refill takes less than 2 seconds.  Neurological:     Mental Status: He is alert.  Psychiatric:        Mood and Affect: Mood normal.        Behavior: Behavior normal.     ED Results and Treatments Labs (all labs ordered are listed, but only abnormal results are displayed) Labs Reviewed  CBC WITH  DIFFERENTIAL/PLATELET - Abnormal; Notable for the following components:      Result Value   Hemoglobin 12.6 (*)    HCT 38.5 (*)    RDW 17.7 (*)    All other components within normal limits  BASIC METABOLIC PANEL WITH GFR - Abnormal; Notable for the following components:   Chloride 95 (*)    Glucose, Bld 216 (*)    BUN 54 (*)    Creatinine, Ser 3.46 (*)    GFR, Estimated 17 (*)    Anion gap 16 (*)    All other components within normal limits  URINALYSIS, ROUTINE W REFLEX MICROSCOPIC - Abnormal; Notable for the following components:   Hgb urine dipstick MODERATE (*)    Protein, ur 100 (*)    All other components within normal limits  URINALYSIS, MICROSCOPIC (REFLEX) - Abnormal; Notable for the following components:   Bacteria, UA RARE (*)    All other components within normal limits  Radiology No results found.  Pertinent labs & imaging results that were available during my care of the patient were reviewed by me and considered in my medical decision making (see MDM for details).  Medications Ordered in ED Medications  sodium chloride  0.9 % bolus 1,000 mL (0 mLs Intravenous Stopped 11/02/24 2054)                                                                                                                                     Procedures .Critical Care  Performed by: Elnor Jayson LABOR, DO Authorized by: Elnor Jayson LABOR, DO   Critical care provider statement:    Critical care time (minutes):  30   Critical care time was exclusive of:  Separately billable procedures and treating other patients   Critical care was necessary to treat or prevent imminent or life-threatening deterioration of the following conditions:  Renal failure and dehydration   Critical care was time spent personally by me on the following activities:  Development of treatment plan with patient or  surrogate, discussions with consultants, evaluation of patient's response to treatment, examination of patient, ordering and review of laboratory studies, ordering and review of radiographic studies, ordering and performing treatments and interventions, pulse oximetry, re-evaluation of patient's condition, review of old charts and obtaining history from patient or surrogate   Care discussed with: admitting provider     (including critical care time)  Medical Decision Making / ED Course    Medical Decision Making:    Jesse Hansen is a 78 y.o. male with past medical history as below, significant for systolic heart failure, CAD, DM, PTSD, PVD, paroxysmal atrial fibrillation on Eliquis who presents to the ED with complaint of abnormal labs. The complaint involves an extensive differential diagnosis and also carries with it a high risk of complications and morbidity.  Serious etiology was considered. Ddx includes but is not limited to: Dehydration, overdiuresis, medication effect, lab error, metabolic derangement, electrolyte derangement, etc.  Complete initial physical exam performed, notably the patient was in no acute distress.    Reviewed and confirmed nursing documentation for past medical history, family history, social history.  Vital signs reviewed.    Dehydration AKI  Over diuresis> - Baseline creatinine around 1.7-1.9.  Creatinine today is 3.46.  BUN is 54. - Patient's Lasix  was recently increased around a week ago. - Concerned that he has been over diuresed given recent increase to his lasix  at o/p.  Will start fluid resuscitation - hx HFrEF > Echocardiogram on 1/25 with LVEF 20 to 25%, G2 DD - give severity of his CHF and aki would recommend observation for judicious rehydration  - pt/family are in agreement with plan - Spoke w/ Dr Alfornia who accepts pt for admission    Clinical Course as of 11/02/24 2350  Fri Nov 02, 2024  1949 Creatinine(!): 3.46 2.6 on 10/05/24 [SG]     Clinical Course User  Index [SG] Elnor Jayson LABOR, DO                   Additional history obtained: -Additional history obtained from family -External records from outside source obtained and reviewed including: Chart review including previous notes, labs, imaging, consultation notes including  Prior echo Prior [facility] labs/documentation, home meds   Lab Tests: -I ordered, reviewed, and interpreted labs.   The pertinent results include:   Labs Reviewed  CBC WITH DIFFERENTIAL/PLATELET - Abnormal; Notable for the following components:      Result Value   Hemoglobin 12.6 (*)    HCT 38.5 (*)    RDW 17.7 (*)    All other components within normal limits  BASIC METABOLIC PANEL WITH GFR - Abnormal; Notable for the following components:   Chloride 95 (*)    Glucose, Bld 216 (*)    BUN 54 (*)    Creatinine, Ser 3.46 (*)    GFR, Estimated 17 (*)    Anion gap 16 (*)    All other components within normal limits  URINALYSIS, ROUTINE W REFLEX MICROSCOPIC - Abnormal; Notable for the following components:   Hgb urine dipstick MODERATE (*)    Protein, ur 100 (*)    All other components within normal limits  URINALYSIS, MICROSCOPIC (REFLEX) - Abnormal; Notable for the following components:   Bacteria, UA RARE (*)    All other components within normal limits    Notable for aki, +BUN  EKG   EKG Interpretation Date/Time:  Friday November 02 2024 18:52:04 EST Ventricular Rate:  63 PR Interval:  90 QRS Duration:  199 QT Interval:  513 QTC Calculation: 526 R Axis:   -86  Text Interpretation: Sinus rhythm Ventricular premature complex Short PR interval IVCD, consider atypical RBBB Probable anterior infarct, age indeterminate paced Confirmed by Elnor Jayson ([id number]) on 11/02/2024 7:27:02 PM         Imaging Studies ordered: na   Medicines ordered and prescription drug management: Meds ordered this encounter  Medications   sodium chloride  0.9 % bolus 1,000 mL    -I have  reviewed the patients home medicines and have made adjustments as needed   Consultations Obtained: I requested consultation with the [facility],  and discussed lab and imaging findings as well as pertinent plan   Cardiac Monitoring: The patient was maintained on a cardiac monitor.  I personally viewed and interpreted the cardiac monitored which showed an underlying rhythm of: paced rhythm Continuous pulse oximetry interpreted by myself, 100% on RA.    Social Determinants of Health:  Diagnosis or treatment significantly limited by social determinants of health: current smoker   Reevaluation: After the interventions noted above, I reevaluated the patient and found that they have improved  Co morbidities that complicate the patient evaluation  Past Medical History:  Diagnosis Date   Agent orange exposure    Anxiety    Carotid artery disease    a. s/p carotid endarterectomy remotely.   Chronic systolic CHF (congestive heart failure) (HCC)    COLONIC POLYPS, HX OF 01/29/2011   Qualifier: Diagnosis of  By: Norleen MD, Lynwood ORN    Coronary artery disease involving native coronary artery with angina pectoris    a. s/p Multiple PCIs. b. CABG x 3 in 2013 (SVG-OM1, SVG-Diag with freeLIMA-LAD from SVG-Diag hood) --> relook cath in ~2015 with at least 1 graft ~occluded by report.   Diabetes mellitus with circulatory complication [facility])    ERECTILE DYSFUNCTION 08/15/2007   Qualifier:  Diagnosis of  By: Dance CMA (AAMA), Kim     Essential hypertension    Hyperlipidemia    Insomnia    LBBB (left bundle branch block)    Pneumonia    PTSD 07/30/2010   Qualifier: Diagnosis of  By: Norleen MD, Lynwood ORN    PVD (peripheral vascular disease)    a. s/p AOBF and LCE in the 1990s.      Dispostion: Disposition decision including need for hospitalization was considered, and patient admitted to the hospital.    Final Clinical Impression(s) / ED Diagnoses Final diagnoses:  AKI (acute kidney injury)   Dehydration          [1]  Social History Tobacco Use   Smoking status: Every Day    Current packs/day: 0.25    Types: Cigarettes    Passive exposure: Never   Smokeless tobacco: Never   Tobacco comments:    3 cigarettes per day  Vaping Use   Vaping status: Never Used  Substance Use Topics   Alcohol  use: Not Currently    Alcohol /week: 0.0 standard drinks of alcohol    Drug use: No     Elnor Jayson LABOR, DO 11/02/24 2351

## 2024-11-02 NOTE — ED Triage Notes (Signed)
 Pt presents after receiving call from [facility] that he needed to come in d/t being dehydrated and needing IV fluids.  Pt has not had any recent illnesses but believes he may have been taking too much lasix .

## 2024-11-02 NOTE — Plan of Care (Signed)
 Plan of Care Note for accepted transfer   Patient name: Jesse Hansen FMW:[id number] DOB: 09-Feb-1946  Facility requesting transfer: [facility] Requesting Provider: Dr. Elnor Reason for transfer: AKI Facility course: 78 year old male with history of CAD, HFrEF, diabetes, hypertension, hyperlipidemia, PVD, paroxysmal A-fib on Eliquis.  Patient was seen at the [facility] earlier where he had routine labs drawn and was directed to come to the ED due to concern for profound dehydration.  Dose of his Lasix  was recently increased about a week ago.  Blood pressure soft on arrival and improved after 1 L IV fluids.  BUN 54, creatinine 3.46 (was 1.7 in January 2025).  Plan of care: The patient is accepted for admission to Telemetry unit at [facility].  [facility] will assume care on arrival to accepting facility. Until arrival, care as per EDP. However, [facility] available 24/7 for questions and assistance.  Check [email or web address] for on-call coverage.  Nursing staff, please call [facility] Admits & Consults System-Wide number under Amion on patient's arrival so appropriate admitting provider can evaluate the pt.

## 2024-11-02 NOTE — ED Notes (Signed)
 Needs redraw of Blue tube. Tube underfilled

## 2024-11-03 ENCOUNTER — Encounter (HOSPITAL_COMMUNITY): Payer: Self-pay

## 2024-11-03 ENCOUNTER — Observation Stay (HOSPITAL_COMMUNITY)

## 2024-11-03 DIAGNOSIS — F17210 Nicotine dependence, cigarettes, uncomplicated: Secondary | ICD-10-CM | POA: Diagnosis present

## 2024-11-03 DIAGNOSIS — Z9889 Other specified postprocedural states: Secondary | ICD-10-CM

## 2024-11-03 DIAGNOSIS — I130 Hypertensive heart and chronic kidney disease with heart failure and stage 1 through stage 4 chronic kidney disease, or unspecified chronic kidney disease: Secondary | ICD-10-CM | POA: Diagnosis present

## 2024-11-03 DIAGNOSIS — Z951 Presence of aortocoronary bypass graft: Secondary | ICD-10-CM | POA: Diagnosis not present

## 2024-11-03 DIAGNOSIS — F4310 Post-traumatic stress disorder, unspecified: Secondary | ICD-10-CM

## 2024-11-03 DIAGNOSIS — I480 Paroxysmal atrial fibrillation: Secondary | ICD-10-CM | POA: Diagnosis present

## 2024-11-03 DIAGNOSIS — N179 Acute kidney failure, unspecified: Secondary | ICD-10-CM | POA: Diagnosis present

## 2024-11-03 DIAGNOSIS — G4700 Insomnia, unspecified: Secondary | ICD-10-CM | POA: Diagnosis present

## 2024-11-03 DIAGNOSIS — Z9861 Coronary angioplasty status: Secondary | ICD-10-CM | POA: Diagnosis not present

## 2024-11-03 DIAGNOSIS — E7849 Other hyperlipidemia: Secondary | ICD-10-CM

## 2024-11-03 DIAGNOSIS — Z7902 Long term (current) use of antithrombotics/antiplatelets: Secondary | ICD-10-CM | POA: Diagnosis not present

## 2024-11-03 DIAGNOSIS — I429 Cardiomyopathy, unspecified: Secondary | ICD-10-CM | POA: Diagnosis present

## 2024-11-03 DIAGNOSIS — I10 Essential (primary) hypertension: Secondary | ICD-10-CM | POA: Diagnosis not present

## 2024-11-03 DIAGNOSIS — Z7982 Long term (current) use of aspirin: Secondary | ICD-10-CM | POA: Diagnosis not present

## 2024-11-03 DIAGNOSIS — E1151 Type 2 diabetes mellitus with diabetic peripheral angiopathy without gangrene: Secondary | ICD-10-CM | POA: Diagnosis present

## 2024-11-03 DIAGNOSIS — Z7984 Long term (current) use of oral hypoglycemic drugs: Secondary | ICD-10-CM | POA: Diagnosis not present

## 2024-11-03 DIAGNOSIS — F411 Generalized anxiety disorder: Secondary | ICD-10-CM | POA: Diagnosis present

## 2024-11-03 DIAGNOSIS — N1832 Chronic kidney disease, stage 3b: Secondary | ICD-10-CM

## 2024-11-03 DIAGNOSIS — I2510 Atherosclerotic heart disease of native coronary artery without angina pectoris: Secondary | ICD-10-CM | POA: Diagnosis present

## 2024-11-03 DIAGNOSIS — J449 Chronic obstructive pulmonary disease, unspecified: Secondary | ICD-10-CM | POA: Diagnosis present

## 2024-11-03 DIAGNOSIS — Z794 Long term (current) use of insulin: Secondary | ICD-10-CM | POA: Diagnosis not present

## 2024-11-03 DIAGNOSIS — I350 Nonrheumatic aortic (valve) stenosis: Secondary | ICD-10-CM | POA: Diagnosis present

## 2024-11-03 DIAGNOSIS — E785 Hyperlipidemia, unspecified: Secondary | ICD-10-CM | POA: Diagnosis present

## 2024-11-03 DIAGNOSIS — E119 Type 2 diabetes mellitus without complications: Secondary | ICD-10-CM | POA: Diagnosis not present

## 2024-11-03 DIAGNOSIS — Z8709 Personal history of other diseases of the respiratory system: Secondary | ICD-10-CM

## 2024-11-03 DIAGNOSIS — Z8673 Personal history of transient ischemic attack (TIA), and cerebral infarction without residual deficits: Secondary | ICD-10-CM | POA: Diagnosis not present

## 2024-11-03 DIAGNOSIS — E860 Dehydration: Secondary | ICD-10-CM | POA: Diagnosis present

## 2024-11-03 DIAGNOSIS — I5022 Chronic systolic (congestive) heart failure: Secondary | ICD-10-CM | POA: Diagnosis present

## 2024-11-03 DIAGNOSIS — Z955 Presence of coronary angioplasty implant and graft: Secondary | ICD-10-CM | POA: Diagnosis not present

## 2024-11-03 DIAGNOSIS — Z8249 Family history of ischemic heart disease and other diseases of the circulatory system: Secondary | ICD-10-CM | POA: Diagnosis not present

## 2024-11-03 DIAGNOSIS — Z7901 Long term (current) use of anticoagulants: Secondary | ICD-10-CM | POA: Diagnosis not present

## 2024-11-03 DIAGNOSIS — E1122 Type 2 diabetes mellitus with diabetic chronic kidney disease: Secondary | ICD-10-CM | POA: Diagnosis present

## 2024-11-03 LAB — COMPREHENSIVE METABOLIC PANEL WITH GFR
ALT: 23 U/L (ref 0–44)
AST: 21 U/L (ref 15–41)
Albumin: 3 g/dL — ABNORMAL LOW (ref 3.5–5.0)
Alkaline Phosphatase: 55 U/L (ref 38–126)
Anion gap: 9 (ref 5–15)
BUN: 54 mg/dL — ABNORMAL HIGH (ref 8–23)
CO2: 25 mmol/L (ref 22–32)
Calcium: 8.9 mg/dL (ref 8.9–10.3)
Chloride: 99 mmol/L (ref 98–111)
Creatinine, Ser: 3.19 mg/dL — ABNORMAL HIGH (ref 0.61–1.24)
GFR, Estimated: 19 mL/min — ABNORMAL LOW (ref >60)
Glucose, Bld: 335 mg/dL — ABNORMAL HIGH (ref 70–99)
Potassium: 4.2 mmol/L (ref 3.5–5.1)
Sodium: 133 mmol/L — ABNORMAL LOW (ref 135–145)
Total Bilirubin: 1.1 mg/dL (ref 0.0–1.2)
Total Protein: 6.4 g/dL — ABNORMAL LOW (ref 6.5–8.1)

## 2024-11-03 LAB — CBC
HCT: 33.8 % — ABNORMAL LOW (ref 39.0–52.0)
Hemoglobin: 11.4 g/dL — ABNORMAL LOW (ref 13.0–17.0)
MCH: 29.5 pg (ref 26.0–34.0)
MCHC: 33.7 g/dL (ref 30.0–36.0)
MCV: 87.6 fL (ref 80.0–100.0)
Platelets: 143 K/uL — ABNORMAL LOW (ref 150–400)
RBC: 3.86 MIL/uL — ABNORMAL LOW (ref 4.22–5.81)
RDW: 17.6 % — ABNORMAL HIGH (ref 11.5–15.5)
WBC: 7 K/uL (ref 4.0–10.5)
nRBC: 0 % (ref 0.0–0.2)

## 2024-11-03 LAB — SODIUM, URINE, RANDOM: Sodium, Ur: 42 mmol/L

## 2024-11-03 LAB — GLUCOSE, CAPILLARY
Glucose-Capillary: 239 mg/dL — ABNORMAL HIGH (ref 70–99)
Glucose-Capillary: 281 mg/dL — ABNORMAL HIGH (ref 70–99)
Glucose-Capillary: 288 mg/dL — ABNORMAL HIGH (ref 70–99)
Glucose-Capillary: 157 mg/dL — ABNORMAL HIGH (ref 70–99)
Glucose-Capillary: 210 mg/dL — ABNORMAL HIGH (ref 70–99)

## 2024-11-03 LAB — CREATININE, URINE, RANDOM: Creatinine, Urine: 87 mg/dL

## 2024-11-03 MED ORDER — METOPROLOL SUCCINATE ER 25 MG PO TB24
12.5000 mg | ORAL_TABLET | Freq: Every day | ORAL | Status: DC
  Administered 2024-11-03 – 2024-11-04 (×2): 12.5 mg via ORAL
  Filled 2024-11-03 (×2): qty 1

## 2024-11-03 MED ORDER — LACTATED RINGERS IV SOLN: INTRAVENOUS | Status: DC

## 2024-11-03 MED ORDER — SODIUM CHLORIDE 0.9% FLUSH
3.0000 mL | Freq: Two times a day (BID) | INTRAVENOUS | Status: DC
  Administered 2024-11-03 – 2024-11-04 (×3): 3 mL via INTRAVENOUS

## 2024-11-03 MED ORDER — ARFORMOTEROL TARTRATE 15 MCG/2ML IN NEBU
15.0000 ug | INHALATION_SOLUTION | Freq: Two times a day (BID) | RESPIRATORY_TRACT | Status: DC
  Administered 2024-11-03 – 2024-11-04 (×2): 15 ug via RESPIRATORY_TRACT
  Filled 2024-11-03 (×3): qty 2

## 2024-11-03 MED ORDER — AMIODARONE HCL 200 MG PO TABS
200.0000 mg | ORAL_TABLET | Freq: Every day | ORAL | Status: DC
  Administered 2024-11-03 – 2024-11-04 (×2): 200 mg via ORAL
  Filled 2024-11-03 (×2): qty 1

## 2024-11-03 MED ORDER — SODIUM CHLORIDE 0.9% FLUSH: 3.0000 mL | Freq: Two times a day (BID) | INTRAVENOUS | Status: DC

## 2024-11-03 MED ORDER — ASPIRIN 81 MG PO TBEC: 81.0000 mg | DELAYED_RELEASE_TABLET | Freq: Every day | ORAL | Status: DC

## 2024-11-03 MED ORDER — LACTATED RINGERS IV SOLN: INTRAVENOUS | Status: AC

## 2024-11-03 MED ORDER — GLIPIZIDE 5 MG PO TABS
5.0000 mg | ORAL_TABLET | Freq: Two times a day (BID) | ORAL | Status: DC
  Administered 2024-11-03 – 2024-11-04 (×2): 5 mg via ORAL
  Filled 2024-11-03 (×3): qty 1

## 2024-11-03 MED ORDER — LORAZEPAM 1 MG PO TABS: 2.0000 mg | ORAL_TABLET | Freq: Every day | ORAL | Status: DC

## 2024-11-03 MED ORDER — CARVEDILOL 6.25 MG PO TABS: 6.2500 mg | ORAL_TABLET | Freq: Two times a day (BID) | ORAL | Status: DC

## 2024-11-03 MED ORDER — EZETIMIBE 10 MG PO TABS
10.0000 mg | ORAL_TABLET | Freq: Every day | ORAL | Status: DC
  Administered 2024-11-03 – 2024-11-04 (×2): 10 mg via ORAL
  Filled 2024-11-03 (×2): qty 1

## 2024-11-03 MED ORDER — ROSUVASTATIN CALCIUM 20 MG PO TABS
40.0000 mg | ORAL_TABLET | Freq: Every evening | ORAL | Status: DC
  Administered 2024-11-03: 40 mg via ORAL
  Filled 2024-11-03: qty 2

## 2024-11-03 MED ORDER — INSULIN ASPART 100 UNIT/ML IJ SOLN
0.0000 [IU] | Freq: Three times a day (TID) | INTRAMUSCULAR | Status: DC
  Administered 2024-11-03: 3 [IU] via SUBCUTANEOUS
  Administered 2024-11-03: 1 [IU] via SUBCUTANEOUS
  Administered 2024-11-03 – 2024-11-04 (×2): 3 [IU] via SUBCUTANEOUS
  Filled 2024-11-03: qty 1
  Filled 2024-11-03 (×3): qty 3

## 2024-11-03 MED ORDER — LORAZEPAM 1 MG PO TABS: 1.0000 mg | ORAL_TABLET | Freq: Every day | ORAL | Status: DC

## 2024-11-03 MED ORDER — ONDANSETRON HCL 4 MG PO TABS: 4.0000 mg | ORAL_TABLET | Freq: Four times a day (QID) | ORAL | Status: DC | PRN

## 2024-11-03 MED ORDER — GLIPIZIDE 5 MG PO TABS: 7.5000 mg | ORAL_TABLET | Freq: Two times a day (BID) | ORAL | Status: DC

## 2024-11-03 MED ORDER — UMECLIDINIUM BROMIDE 62.5 MCG/ACT IN AEPB
1.0000 | INHALATION_SPRAY | Freq: Every day | RESPIRATORY_TRACT | Status: DC
  Filled 2024-11-03 (×2): qty 7

## 2024-11-03 MED ORDER — CLOPIDOGREL BISULFATE 75 MG PO TABS
75.0000 mg | ORAL_TABLET | Freq: Every day | ORAL | Status: DC
  Administered 2024-11-03 – 2024-11-04 (×2): 75 mg via ORAL
  Filled 2024-11-03 (×2): qty 1

## 2024-11-03 MED ORDER — INSULIN GLARGINE 100 UNIT/ML ~~LOC~~ SOLN
20.0000 [IU] | Freq: Every day | SUBCUTANEOUS | Status: DC
  Administered 2024-11-03: 20 [IU] via SUBCUTANEOUS
  Filled 2024-11-03 (×2): qty 0.2

## 2024-11-03 MED ORDER — LORAZEPAM 1 MG PO TABS
2.0000 mg | ORAL_TABLET | Freq: Every day | ORAL | Status: DC
  Administered 2024-11-03 (×2): 2 mg via ORAL
  Filled 2024-11-03 (×2): qty 2

## 2024-11-03 MED ORDER — APIXABAN 2.5 MG PO TABS
2.5000 mg | ORAL_TABLET | Freq: Two times a day (BID) | ORAL | Status: DC
  Administered 2024-11-03 – 2024-11-04 (×3): 2.5 mg via ORAL
  Filled 2024-11-03 (×3): qty 1

## 2024-11-03 MED ORDER — ONDANSETRON HCL 4 MG/2ML IJ SOLN: 4.0000 mg | Freq: Four times a day (QID) | INTRAMUSCULAR | Status: DC | PRN

## 2024-11-03 MED ORDER — INSULIN ASPART 100 UNIT/ML IJ SOLN
0.0000 [IU] | Freq: Every day | INTRAMUSCULAR | Status: DC
  Administered 2024-11-03: 2 [IU] via SUBCUTANEOUS
  Filled 2024-11-03: qty 1

## 2024-11-03 MED ORDER — IPRATROPIUM-ALBUTEROL 0.5-2.5 (3) MG/3ML IN SOLN: 3.0000 mL | RESPIRATORY_TRACT | Status: DC | PRN

## 2024-11-03 MED ORDER — SODIUM CHLORIDE 0.9% FLUSH: 3.0000 mL | INTRAVENOUS | Status: DC | PRN

## 2024-11-03 MED ORDER — SODIUM CHLORIDE 0.9 % IV SOLN: 250.0000 mL | INTRAVENOUS | Status: AC | PRN

## 2024-11-03 NOTE — Progress Notes (Signed)
 78 year old male diabetes mellitus  posttraumatic neurosis  hyperlipidemia previous CVA carotid artery disease with endarterectomies  Moderate to severe COPD-former smoker  HFrEF CABG X3 2013 with multiple PCI's -sees cardiology at the [facility] recent echo at [facility] shows EF 38% grade 3 restrictive pattern with mitral regurg possible pulmonary HTN [facility] PPM 07/05/2022 under the care of Dr. Waymond with [facility]  lung cancer stage I NSCLC RLL SBRT completed 06/11/2023 with lobectomy followed by [facility] with XRT  On last images underlying indeterminant 1.5 renal lesion upper pole left kidney Underlying CKD 3B CVA  Last hospitalization [facility] in January 2025 with acute systolic heart failure  review of the [employer]---- notes from yesterday in Care Everywhere looks like his baseline creatinine is about 2.9 with GFR in the 20s-at primary care office he was noted to have a pressure in the 90s over 60s taking extra doses of some of his medications  had screening labs and was told that his kidney function was worse and was told to report to the closest emergency room  Per history 2 weeks ago Lasix  increased to 40 twice daily per patient and then was adjusted by nephrology to an extra dose apparently patient Showed up to the emergency room hypotensive and was transferred to [facility] for AKI superimposed on CKD 3B  OBJECTIVE: BP 116/69 (BP Location: Right Arm)   Pulse 63   Temp 98.2 F (36.8 C)   Resp 18   SpO2 98%  Awake coherent alert no distress EOMI NCAT slow speech Slightly distended abdomen ROM intact S1-S2 no murmur no rub no gallop  ASSESSMENT:   PLAN: Called wife Ted updated her Explained he was overdiuresed At discharge would hold Lasix  40 Aldactone  as well as losartan  which are likely the culprits Today we will give fluids cautiously at 50 cc/H and reassess in the morning with labs He is a diabetic and and his sugars are not controlled-glipizide  is  pretty safe in renal insufficiency so have resumed it at a slightly lower dose of 5 twice daily instead of 7.5 He should continue his apixaban  as well as his Plavix  for his heart issues and I think if he looks stable tomorrow after ambulating in the hallway (order placed) he can go home tomorrow  Reggie Grimes, MD [facility] 12:46 PM

## 2024-11-03 NOTE — ED Notes (Signed)
 [facility] called for transport.

## 2024-11-03 NOTE — H&P (Addendum)
 History and Physical    Jesse Hansen FMW:[id number] DOB: 05/16/1946 DOA: 11/02/2024  PCP: Milissa Savant, MD   Patient coming from: Home   Chief Complaint:  Chief Complaint  Patient presents with   Abnormal Labs   ED TRIAGE note:  Pt presents after receiving call from [place] that he needed to come in d/t being dehydrated and needing IV fluids.  Pt has not had any recent illnesses but believes he may have been taking too much lasix .      HPI:  Jesse Hansen is a 78 y.o. male with medical history significant of HFrEF < 20 to 25% s/p ICD, severe aortic stenosis, essential hypertension, hyperlipidemia, history of CVA, carotid artery stenosis status post bilateral endarterectomy, CAD status post multiple PCI and CABG x 3 in 2013, COPD, paroxysmal A-fib on Eliquis , insulin -dependent DM type II, CKD 3B, insomnia, PTSD and peripheral vascular disease presented emergency department referred from [place] as lab work showed abnormal renal function.  Patient reported that recently Lasix  dose has been increased and he is profoundly dehydrated due to excessive diuresis.  Patient denies any dizziness, headache, fall and loss of consciousness.  Denies any chest pain, palpitation, shortness of breath, and lower extremity swelling.  Patient is wife at the bedside reported that approximately 2 weeks ago Lasix  has been increased to 80 mg in the morning and 40 mg nighttime as patient was developing abdominal swelling and worsening bilateral lower extremity swelling.  No other blood pressure dose adjustment. During my evaluation at bedside patient seems dehydrated however per patient's wife since he received 1 L of bolus in the emergency department he is looking more hydrated and improvement of skin dryness.   At presentation to emergency department patient was hypotensive blood pressure 92/52 which improved with fluid resuscitation 1 L.  Otherwise hemodynamically stable.  Lab, BMP showing elevated blood close 216,  elevated creatinine 3.  4 6 baseline creatinine around 1.5-1.7, elevated anion gap 16. UA bacteria positive however no evidence of UTI. EKG showing normal sinus rhythm heart rate 63, premature ventricular complex, and right bundle branch block.  At the emergency department patient received 1 L of NS bolus blood pressure has been improved.  Hospitalist has been consulted and patient has been transferred to [facility] for further evaluation management of AKI on CKD stage IIIb.    Significant labs in the ED: Lab Orders         CBC with Differential         Basic metabolic panel         Urinalysis, Routine w reflex microscopic -Urine, Clean Catch         Urinalysis, Microscopic (reflex)         Sodium, urine, random         Creatinine, urine, random         Glucose, capillary         CBC         Comprehensive metabolic panel         Microalbumin / creatinine urine ratio       Review of Systems:  Review of Systems  Constitutional:  Negative for chills, fever, malaise/fatigue and weight loss.  Respiratory:  Negative for cough, sputum production and shortness of breath.   Cardiovascular:  Negative for chest pain, orthopnea, claudication and leg swelling.  Gastrointestinal:  Negative for heartburn, nausea and vomiting.  Neurological:  Negative for dizziness and headaches.  Psychiatric/Behavioral:  Negative for depression. The patient  has insomnia. The patient is not nervous/anxious.     Past Medical History:  Diagnosis Date   Agent orange exposure    Anxiety    Carotid artery disease    a. s/p carotid endarterectomy remotely.   Chronic systolic CHF (congestive heart failure) (HCC)    COLONIC POLYPS, HX OF 01/29/2011   Qualifier: Diagnosis of  By: Norleen MD, Lynwood ORN    Coronary artery disease involving native coronary artery with angina pectoris    a. s/p Multiple PCIs. b. CABG x 3 in 2013 (SVG-OM1, SVG-Diag with freeLIMA-LAD from SVG-Diag hood) --> relook cath in ~2015 with  at least 1 graft ~occluded by report.   Diabetes mellitus with circulatory complication [facility])    ERECTILE DYSFUNCTION 08/15/2007   Qualifier: Diagnosis of  By: Dance CMA (AAMA), Kim     Essential hypertension    Hyperlipidemia    Insomnia    LBBB (left bundle branch block)    Pneumonia    PTSD 07/30/2010   Qualifier: Diagnosis of  By: Norleen MD, Lynwood ORN    PVD (peripheral vascular disease)    a. s/p AOBF and LCE in the 1990s.    Past Surgical History:  Procedure Laterality Date   ABDOMINAL AORTIC ANEURYSM REPAIR  1996?   Done at [facility] by Dr. Oris   CAROTID ENDARTERECTOMY Left    Dr. Darron at [facility] in [place]    CAROTID PTA/STENT INTERVENTION Left 03/24/2017   Procedure: Carotid PTA/Stent Intervention;  Surgeon: Gaile ORN New, MD;  Location: [facility] INVASIVE CV LAB;  Service: Cardiovascular;  Laterality: Left;   CAROTID-SUBCLAVIAN BYPASS GRAFT Left 02/05/2022   Procedure: LEFT CAROTID-SUBCLAVIAN BYPASS GRAFT;  Surgeon: New Gaile ORN, MD;  Location: [facility] OR;  Service: Vascular;  Laterality: Left;   CORONARY ARTERY BYPASS GRAFT  2013   @ [facility] [place], [place] (b/c [facility] OR was contaminated) -- SVG-Diag with freeLIMA-LAD sewn to SVG hood, SVG-OM1   CORONARY STENT PLACEMENT  2007   LEFT HEART CATH AND CORS/GRAFTS ANGIOGRAPHY N/A 01/05/2018   Procedure: LEFT HEART CATH AND CORS/GRAFTS ANGIOGRAPHY;  Surgeon: Jordan, Peter M, MD;  Location: [facility] INVASIVE CV LAB;  Service: Cardiovascular;  Laterality: N/A;   PROSTATE BIOPSY  2009     reports that he has been smoking cigarettes. He has never been exposed to tobacco smoke. He has never used smokeless tobacco. He reports that he does not currently use alcohol . He reports that he does not use drugs.  Allergies[1]  Family History  Problem Relation Age of Onset   Hypertension Father     Prior to Admission medications  Medication Sig Start Date End Date Taking? Authorizing Provider  acetaminophen  (TYLENOL ) 500 MG tablet Take  500-1,000 mg by mouth every 6 (six) hours as needed (for pain.).    [provider]  albuterol  (VENTOLIN  HFA) 108 (90 Base) MCG/ACT inhaler Inhale 2 puffs into the lungs 2 (two) times daily.    [provider]  Alirocumab 150 MG/ML SOAJ Inject into the skin. 05/31/24   [provider]  amiodarone  (PACERONE ) 200 MG tablet Take 200 mg by mouth daily.    [provider]  ammonium lactate (LAC-HYDRIN) 12 % lotion Apply 1 application. topically as needed for dry skin. 09/17/20   [provider]  apixaban  (ELIQUIS ) 2.5 MG TABS tablet Take 2.5 mg by mouth. 08/13/24   [provider]  aspirin  EC 81 MG tablet Take 1 tablet (81 mg total) by mouth daily. Patient taking  differently: Take 81 mg by mouth every evening. 05/15/14   [name] ORN, MD  carboxymethylcellulose (REFRESH PLUS) 0.5 % SOLN Place 1 drop into both eyes 3 (three) times daily as needed (dry/irritated eyes). 03/26/21   [provider]  carvedilol  (COREG ) 6.25 MG tablet Take 6.25 mg by mouth 2 (two) times daily with a meal.    [provider]  cetirizine (ZYRTEC) 10 MG tablet Take 10 mg by mouth. 03/06/24   [provider]  Cholecalciferol  (VITAMIN D3) 50 MCG (2000 UT) TABS Take 50 mcg by mouth every morning.    [provider]  clopidogrel  (PLAVIX ) 75 MG tablet Take 75 mg by mouth daily.    [provider]  Coenzyme Q10 (COQ10) 50 MG CAPS Take 50 mg by mouth every morning.    [provider]  ezetimibe  (ZETIA ) 10 MG tablet Take 1 tablet by mouth daily. 03/13/24   [provider]  furosemide  (LASIX ) 40 MG tablet Take 1 tablet (40 mg total) by mouth daily. 06/24/20   McDaniel, Jill D, NP  glipiZIDE  (GLUCOTROL ) 5 MG tablet Take 7.5 mg by mouth 2 (two) times daily before a meal. Take 30 minutes prior to meals.    [provider]  insulin  glargine-yfgn (SEMGLEE ) 100 UNIT/ML Pen Inject 20 Units into the skin every evening.    [provider]  ipratropium (ATROVENT ) 0.03 % nasal spray Place 2 sprays into both nostrils 2 (two) times daily as needed for rhinitis.    [provider]  lidocaine  (LIDODERM ) 5 % Place onto the skin. 06/01/24   [provider]  LORazepam  (ATIVAN ) 1 MG tablet Take 2 tablets (2 mg total) by mouth at bedtime. 10/02/24   [name] ORN, MD  losartan  (COZAAR ) 25 MG tablet Take 25 mg by mouth daily. 10/09/23   [provider]  metoprolol  succinate (TOPROL -XL) 25 MG 24 hr tablet Take 12.5 mg by mouth. 12/28/23   [provider]  nitroGLYCERIN  (NITROSTAT ) 0.4 MG SL tablet Place 0.4 mg under the tongue every 5 (five) minutes x 3 doses as needed for chest pain.    [provider]  rosuvastatin  (CRESTOR ) 40 MG tablet Take 40 mg by mouth every evening.    [provider]  spironolactone  (ALDACTONE ) 25 MG tablet Take 25 mg by mouth daily. TAKE ONE TABLET BY MOUTH DAILY FOR CARDIOMYOPATHY - STOP POTASSIUM SUPPLEMENT. FOLLOW UP RENAL PANEL 5-10 DAYS AFTER STARTING SPIRONOLACTONE . FOR CARDIOMYOPATHY - STOP POTASSIUM SUPPLEMENT. FOLLOW UP RENAL PANEL 5-10 DAYS AFTER STARTING SPIRONOLACTONE . 10/09/23   [provider]  Tiotropium Bromide-Olodaterol 2.5-2.5 MCG/ACT AERS Take by mouth. 01/23/24   [provider]  vitamin C (ASCORBIC ACID) 500 MG tablet Take 500 mg by mouth every morning.    [provider]     Physical Exam: Vitals:   11/02/24 2125 11/02/24 2359 11/03/24 0208 11/03/24 0250  BP:  101/62 95/62 116/69  Pulse:  75 62 63  Resp:  11 13 18   Temp:  97.9 F (36.6 C) 98.1 F (36.7 C) 98.2 F (36.8 C)  TempSrc:  Oral Oral   SpO2: 97% 97% 98% 98%    Physical Exam Constitutional:      General: He is not in acute distress.    Appearance: He is not ill-appearing.  HENT:     Mouth/Throat:     Mouth: Mucous membranes are moist.  Eyes:     Pupils: Pupils are equal, round, and reactive to light.  Cardiovascular:  Rate and  Rhythm: Normal rate and regular rhythm.     Pulses: Normal pulses.     Heart sounds: Normal heart sounds.  Pulmonary:     Effort: Pulmonary effort is normal.     Breath sounds: Normal breath sounds.  Musculoskeletal:        General: No swelling.     Cervical back: Neck supple.     Right lower leg: No edema.     Left lower leg: No edema.  Skin:    General: Skin is dry.     Capillary Refill: Capillary refill takes less than 2 seconds.  Neurological:     Mental Status: He is alert and oriented to person, place, and time.  Psychiatric:        Attention and Perception: Attention normal.        Mood and Affect: Mood normal. Mood is not anxious or depressed.        Speech: Speech normal.        Behavior: Behavior is cooperative.        Thought Content: Thought content normal.        Cognition and Memory: Cognition and memory normal.      Labs on Admission: I have personally reviewed following labs and imaging studies  CBC: Recent Labs  Lab 11/02/24 1759  WBC 8.7  NEUTROABS 5.2  HGB 12.6*  HCT 38.5*  MCV 89.5  PLT 182   Basic Metabolic Panel: Recent Labs  Lab 11/02/24 1759  NA 135  K 4.2  CL 95*  CO2 24  GLUCOSE 216*  BUN 54*  CREATININE 3.46*  CALCIUM  9.8   GFR: CrCl cannot be calculated (Unknown ideal weight.). Liver Function Tests: No results for input(s): AST, ALT, ALKPHOS, BILITOT, PROT, ALBUMIN in the last 168 hours. No results for input(s): LIPASE, AMYLASE in the last 168 hours. No results for input(s): AMMONIA in the last 168 hours. Coagulation Profile: No results for input(s): INR, PROTIME in the last 168 hours. Cardiac Enzymes: No results for input(s): CKTOTAL, CKMB, CKMBINDEX, TROPONINI, TROPONINIHS in the last 168 hours. BNP (last 3 results) Recent Labs    11/24/23 1508  BNP 1,124.0*   HbA1C: No results for input(s): HGBA1C in the last 72 hours. CBG: Recent Labs  Lab 11/03/24 0301  GLUCAP 239*   Lipid  Profile: No results for input(s): CHOL, HDL, LDLCALC, TRIG, CHOLHDL, LDLDIRECT in the last 72 hours. Thyroid Function Tests: No results for input(s): TSH, T4TOTAL, FREET4, T3FREE, THYROIDAB in the last 72 hours. Anemia Panel: No results for input(s): VITAMINB12, FOLATE, FERRITIN, TIBC, IRON, RETICCTPCT in the last 72 hours. Urine analysis:    Component Value Date/Time   COLORURINE YELLOW 11/02/2024 2056   APPEARANCEUR CLEAR 11/02/2024 2056   LABSPEC 1.015 11/02/2024 2056   PHURINE 6.0 11/02/2024 2056   GLUCOSEU NEGATIVE 11/02/2024 2056   HGBUR MODERATE (A) 11/02/2024 2056   BILIRUBINUR NEGATIVE 11/02/2024 2056   KETONESUR NEGATIVE 11/02/2024 2056   PROTEINUR 100 (A) 11/02/2024 2056   NITRITE NEGATIVE 11/02/2024 2056   LEUKOCYTESUR NEGATIVE 11/02/2024 2056    Radiological Exams on Admission: I have personally reviewed images No results found.    Assessment/Plan: Principal Problem:   Acute kidney injury superimposed on stage 3b chronic kidney disease (HCC) Active Problems:   Hyperlipidemia   PTSD (post-traumatic stress disorder)   Essential hypertension   Insulin  dependent type 2 diabetes mellitus (HCC)   History of CVA (cerebrovascular accident)   CAD S/P percutaneous coronary angioplasty   Paroxysmal  A-fib [facility])   H/O carotid endarterectomy   History of COPD    Assessment and Plan: Prerenal AKI on CKD stage IIIb -Presented to emergency department with complaining of feeling dehydrated after taking too much Lasix  per patient.  At presentation to ED patient found borderline hypotensive otherwise hemodynamically stable.  Received 1 L of NS bolus blood pressure has been improved. - Lab work,BMP showing elevated blood close 216, elevated creatinine 3.  4 6 baseline creatinine around 1.5-1.7, elevated anion gap 16. UA bacteria positive however no evidence of UTI. EKG showing normal sinus rhythm heart rate 63, premature ventricular complex,  and right bundle branch block. -Prerenal acute kidney injury in the setting of combination of polypharmacy-Lasix , losartan  and spironolactone  which is causing AKI and hypotension. - In the ED patient received 1 L of NS bolus. - Has history of reduced EF 20 to 25% starting maintenance fluid LR with lower rate 50 cc/h. - Checking urine creatinine and urine sodium.  Checking urine microalbumin/creatinine ratio.  Obtaining renal ultrasound. - Continue to monitor improvement of renal function, avoid hypotension, avoid nephrotoxic agent and renally adjust medications.   HFrEF < 20-25% s/p AICD Essential hypertension -Continue Toprol -XL 25 mg daily.  Currently Lasix , losartan  and spironolactone  on hold due to AKI. -Given patient has advanced heart failure and currently receiving IV fluid continue to point of development of any volume overload. -Resume GDMT cardiac medications once appropriate based on improvement of renal function.  Insulin -dependent DM type II -Continue Semglee  20 units daily and sliding scale insulin  with mealtime coverage.  History of CAD status post CABG History of PAD History of CVA Stroke carotid artery endarterectomy -Continue Plavix  and Eliquis .  Continue Crestor .  Peroxisomal Atrial Fibrillation Continue Toprol -XL 12.5 mg daily, amiodarone  200 mg daily and Eliquis  2.5 mg twice daily.  History of COPD -Stable.  Continue Brovana  and incurse Ellipta.  Continue DuoNeb as needed.   PTSD Generalized anxiety disorder Insomnia The patient reported RN that he takes Ativan  2 mg every night for sleep management due to underlying PTSD.  Current hemodynamically stable and O2 sat 100% room air.  Continue Ativan .   DVT prophylaxis:  Eliquis  Code Status:  Full Code Diet: Heart healthy-carb modified Family Communication:   Family was present at bedside, at the time of interview. Opportunity was given to ask question and all questions were answered satisfactorily.   Disposition Plan: Continue to monitor improvement of renal function. Consults: None indicated at this time Admission status:   Observation, Telemetry bed  Severity of Illness: The appropriate patient status for this patient is OBSERVATION. Observation status is judged to be reasonable and necessary in order to provide the required intensity of service to ensure the patient's safety. The patient's presenting symptoms, physical exam findings, and initial radiographic and laboratory data in the context of their medical condition is felt to place them at decreased risk for further clinical deterioration. Furthermore, it is anticipated that the patient will be medically stable for discharge from the hospital within 2 midnights of admission.     Alfonso Shackett, MD [facility]  How to contact the [facility] Attending or Consulting provider 7A - 7P or covering provider during after hours 7P -7A, for this patient.  Check the care team in [facility] and look for a) attending/consulting [facility] provider listed and b) the TR[facility] team listed Log into [email or web address] and use [facility]'s universal password to access. If you do not have the password, please contact the hospital operator. Locate the TR[facility] provider  you are looking for under [facility] and page to a number that you can be directly reached. If you still have difficulty reaching the provider, please page the [facility] (Director on Call) for the Hospitalists listed on amion for assistance.  11/03/2024, 3:46 AM           [1]  Allergies Allergen Reactions   Oxycodone -Acetaminophen  Shortness Of Breath and Nausea And Vomiting    Patient tolerates Tylenol    Codeine Nausea And Vomiting and Itching    Other reaction(s): Paresthesia, Finding of vomiting   Jardiance  [Empagliflozin ] Diarrhea and Nausea And Vomiting   Nicotine      Nicotine  Patch--Per Nurse heart issues   Niacin Other (See Comments)    unknown

## 2024-11-03 NOTE — Progress Notes (Signed)
 Patient walked 100 feet using front wheel walker.  Minimal assist and steady gait.

## 2024-11-04 DIAGNOSIS — N1832 Chronic kidney disease, stage 3b: Secondary | ICD-10-CM | POA: Diagnosis not present

## 2024-11-04 DIAGNOSIS — N179 Acute kidney failure, unspecified: Secondary | ICD-10-CM | POA: Diagnosis not present

## 2024-11-04 LAB — BASIC METABOLIC PANEL WITH GFR
Anion gap: 9 (ref 5–15)
BUN: 52 mg/dL — ABNORMAL HIGH (ref 8–23)
CO2: 24 mmol/L (ref 22–32)
Calcium: 9.1 mg/dL (ref 8.9–10.3)
Chloride: 99 mmol/L (ref 98–111)
Creatinine, Ser: 2.72 mg/dL — ABNORMAL HIGH (ref 0.61–1.24)
GFR, Estimated: 23 mL/min — ABNORMAL LOW (ref >60)
Glucose, Bld: 172 mg/dL — ABNORMAL HIGH (ref 70–99)
Potassium: 4 mmol/L (ref 3.5–5.1)
Sodium: 132 mmol/L — ABNORMAL LOW (ref 135–145)

## 2024-11-04 LAB — CBC
HCT: 33.3 % — ABNORMAL LOW (ref 39.0–52.0)
Hemoglobin: 11.4 g/dL — ABNORMAL LOW (ref 13.0–17.0)
MCH: 29.9 pg (ref 26.0–34.0)
MCHC: 34.2 g/dL (ref 30.0–36.0)
MCV: 87.4 fL (ref 80.0–100.0)
Platelets: 134 K/uL — ABNORMAL LOW (ref 150–400)
RBC: 3.81 MIL/uL — ABNORMAL LOW (ref 4.22–5.81)
RDW: 17.5 % — ABNORMAL HIGH (ref 11.5–15.5)
WBC: 7.3 K/uL (ref 4.0–10.5)
nRBC: 0 % (ref 0.0–0.2)

## 2024-11-04 LAB — MICROALBUMIN / CREATININE URINE RATIO
Creatinine, Urine: 76.3 mg/dL
Microalb Creat Ratio: 136 mg/g{creat} — ABNORMAL HIGH (ref 0–29)
Microalb, Ur: 103.7 ug/mL — ABNORMAL HIGH

## 2024-11-04 LAB — GLUCOSE, CAPILLARY
Glucose-Capillary: 273 mg/dL — ABNORMAL HIGH (ref 70–99)
Glucose-Capillary: 215 mg/dL — ABNORMAL HIGH (ref 70–99)

## 2024-11-04 NOTE — Progress Notes (Signed)
 Jesse Hansen to be discharged Home per MD order. Discussed prescriptions and follow up appointments with the patient and wife Prescriptions and medication list explained in detail. Patient and wife verbalized understanding.  Skin clean, dry and intact without evidence of skin break down, no evidence of skin tears noted. IV catheter discontinued intact. Site without signs and symptoms of complications. Dressing and pressure applied. Pt denies pain at the site currently. No complaints noted.  Patient free of lines, drains, and wounds.   An After Visit Summary (AVS) was printed and given to the patient. Patient escorted via wheelchair, and discharged home via private auto.  Franciso Bodily, RN

## 2024-11-04 NOTE — Discharge Summary (Signed)
 Physician Discharge Summary   Patient: Jesse Hansen MRN: [id number] DOB: 05-18-1946  Admit date:     11/02/2024  Discharge date: 11/04/2024  Discharge Physician: Lonni SHAUNNA Dalton   PCP: Milissa Savant, MD     Recommendations at discharge:  Follow up with PCP Dr. Milissa within 1 week Check BMP in 1 week Dr. Milissa: Please resume Lasix , losartan , and spironolactone  as appropriate     Discharge Diagnoses: Principal Problem:   Acute kidney injury superimposed on stage 3b chronic kidney disease (HCC) Active Problems:   Hyperlipidemia   PTSD (post-traumatic stress disorder)   Essential hypertension   Insulin  dependent type 2 diabetes mellitus (HCC)   History of CVA (cerebrovascular accident)   CAD S/P percutaneous coronary angioplasty   Paroxysmal A-fib (HCC)   H/O carotid endarterectomy   History of COPD       Hospital Course: 78 y.o. M with sCHF EF 35-40%, hx PPM, CAD s/p CABG and PCI, severe COPD, hx CVA, carotid disease s/p CEA, DM and HLD who presented with abnormal labs from PCPs office.  In the hospital, Lasix  and spironolactone  were held, given gentle fluids, creatinine improved to 2.7 (baseline 2.9), and he was discharged to follow-up with PCP within 1 week.  Recommend PCP resume diuretics and losartan  as appropriate after repeat BMP.          The [place]  Controlled Substances Registry was reviewed for this patient prior to discharge.   Consultants: None Procedures performed: None needed Disposition: Home health Diet recommendation:  Cardiac and Carb modified diet  DISCHARGE MEDICATION: Allergies as of 11/04/2024       Reactions   Oxycodone -acetaminophen  Shortness Of Breath, Nausea And Vomiting   Patient tolerates Tylenol    Codeine Nausea And Vomiting, Itching   Other reaction(s): Paresthesia, Finding of vomiting   Jardiance  [empagliflozin ] Diarrhea, Nausea And Vomiting   Nicotine     Nicotine  Patch--Per Nurse heart issues   Niacin  Other (See Comments)   unknown        Medication List     PAUSE taking these medications    furosemide  40 MG tablet Wait to take this until your doctor or other care provider tells you to start again. Commonly known as: LASIX  Take 1 tablet (40 mg total) by mouth daily.   losartan  25 MG tablet Wait to take this until your doctor or other care provider tells you to start again. Commonly known as: COZAAR  Take 25 mg by mouth daily.   spironolactone  25 MG tablet Wait to take this until your doctor or other care provider tells you to start again. Commonly known as: ALDACTONE  Take 12.5 mg by mouth daily. TAKE ONE TABLET BY MOUTH DAILY FOR CARDIOMYOPATHY - STOP POTASSIUM SUPPLEMENT. FOLLOW UP RENAL PANEL 5-10 DAYS AFTER STARTING SPIRONOLACTONE . FOR CARDIOMYOPATHY - STOP POTASSIUM SUPPLEMENT. FOLLOW UP RENAL PANEL 5-10 DAYS AFTER STARTING SPIRONOLACTONE .       TAKE these medications    acetaminophen  500 MG tablet Commonly known as: TYLENOL  Take 500-1,000 mg by mouth every 6 (six) hours as needed (for pain.).   albuterol  108 (90 Base) MCG/ACT inhaler Commonly known as: VENTOLIN  HFA Inhale 2 puffs into the lungs 2 (two) times daily.   Alirocumab 150 MG/ML Soaj Inject 150 mg into the skin every 14 (fourteen) days. Praluent   ammonium lactate 12 % lotion Commonly known as: LAC-HYDRIN Apply 1 application. topically as needed for dry skin.   apixaban  2.5 MG Tabs tablet Commonly known as: ELIQUIS  Take 2.5 mg by  mouth 2 (two) times daily.   ascorbic acid 500 MG tablet Commonly known as: VITAMIN C Take 500 mg by mouth every morning.   clopidogrel  75 MG tablet Commonly known as: PLAVIX  Take 75 mg by mouth daily.   CoQ10 50 MG Caps Take 50 mg by mouth every morning.   insulin  aspart 100 UNIT/ML injection Commonly known as: novoLOG  Inject 10 Units into the skin 2 (two) times daily with a meal.   insulin  glargine-yfgn 100 UNIT/ML Pen Commonly known as: SEMGLEE  Inject 20  Units into the skin every evening.   ipratropium 0.03 % nasal spray Commonly known as: ATROVENT  Place 2 sprays into both nostrils 2 (two) times daily as needed for rhinitis.   LORazepam  1 MG tablet Commonly known as: ATIVAN  Take 2 tablets (2 mg total) by mouth at bedtime.   metoprolol  succinate 25 MG 24 hr tablet Commonly known as: TOPROL -XL Take 12.5 mg by mouth daily.   nitroGLYCERIN  0.4 MG SL tablet Commonly known as: NITROSTAT  Place 0.4 mg under the tongue every 5 (five) minutes x 3 doses as needed for chest pain.   rosuvastatin  40 MG tablet Commonly known as: CRESTOR  Take 40 mg by mouth every evening.   Tiotropium Bromide-Olodaterol 2.5-2.5 MCG/ACT Aers Take by mouth.        Follow-up Information     [facility] Follow up.   Why: Physical and Occupational therapy. Office will call to arrange follow up after hospital discharge. Contact information: [address] [place] [address] [phone]                 Discharge Instructions     Discharge instructions   Complete by: As directed    **IMPORTANT DISCHARGE INSTRUCTIONS**   From Dr. Jonel: You were admitted for dehydration which had led to a temporary kidney injury  Here, your Lasix , spironolactone  and losartan  were held and this improved.  Your kidney function is back to your normal Go back to taking your normal home medicines without change EXCEPT do not take: Lasix  Spironolactone  Losartan   Call your primary care physician at the [facility] tomorrow morning (call the doctor who sent you here) and have them check your kidney function WITHIN ONE WEEK  After that kidney check, ask them WHEN YOU SHOULD RESTART furosemide /Lasix , spironolactone , and losartan    Increase activity slowly   Complete by: As directed        Discharge Exam: Filed Weights   11/03/24 1032  Weight: 79.7 kg    General: Pt is alert, awake, not in acute distress Cardiovascular: RRR, nl  S1-S2, no murmurs appreciated.   No LE edema.   Respiratory: Normal respiratory rate and rhythm.  CTAB without rales or wheezes. Abdominal: Abdomen soft and non-tender.  No distension or HSM.   Neuro/Psych: Strength symmetric in upper and lower extremities.  Judgment and insight appear normal.   Condition at discharge: fair  The results of significant diagnostics from this hospitalization (including imaging, microbiology, ancillary and laboratory) are listed below for reference.   Imaging Studies: US  RENAL Result Date: 11/03/2024 CLINICAL DATA:  [id number] AKI (acute kidney injury) [id number] EXAM: RENAL / URINARY TRACT ULTRASOUND COMPLETE COMPARISON:  January 03, 2018 FINDINGS: Right Kidney: Renal measurements: 12.8 x 6.1 x 5.2 cm = volume: 210 mL. Echogenicity is mildly increased. No mass or hydronephrosis visualized. Mild cortical thinning. Left Kidney: Renal measurements: 11.3 x 5.0 x 4.4 cm = volume: 131 mL. Echogenicity is increased. No hydronephrosis visualized. Cortical  thinning. Small hypoechoic to near anechoic exophytic mass along the superior pole measures 12 x 10 x 11 mm, challenging to accurately characterize given location and likely a mildly complicated cyst Bladder: Appears normal for degree of bladder distention. Other: Spleen is prominent spanning 11.5 cm. IMPRESSION: 1. No hydronephrosis. 2. Increased echogenicity of the kidneys as can be seen in medical renal disease. 3. Small exophytic mass along the superior pole of the left kidney, likely a mildly complicated cyst. Recommend follow-up ultrasound in 6 months to assess for stability. Electronically Signed   By: Corean Salter M.D.   On: 11/03/2024 10:19    Microbiology: Results for orders placed or performed during the hospital encounter of 11/24/23  Resp panel by RT-PCR (RSV, Flu A&B, Covid) Anterior Nasal Swab     Status: None   Collection Time: 11/24/23  2:56 PM   Specimen: Anterior Nasal Swab  Result Value Ref Range Status    SARS Coronavirus 2 by RT PCR NEGATIVE NEGATIVE Final   Influenza A by PCR NEGATIVE NEGATIVE Final   Influenza B by PCR NEGATIVE NEGATIVE Final    Comment: (NOTE) The Xpert Xpress SARS-CoV-2/FLU/RSV plus assay is intended as an aid in the diagnosis of influenza from Nasopharyngeal swab specimens and should not be used as a sole basis for treatment. Nasal washings and aspirates are unacceptable for Xpert Xpress SARS-CoV-2/FLU/RSV testing.  Fact Sheet for Patients: bloggercourse.com  Fact Sheet for Healthcare Providers: seriousbroker.it  This test is not yet approved or cleared by the [place]  FDA and has been authorized for detection and/or diagnosis of SARS-CoV-2 by FDA under an Emergency Use Authorization (EUA). This EUA will remain in effect (meaning this test can be used) for the duration of the COVID-19 declaration under Section 564(b)(1) of the Act, 21 U.S.C. section 360bbb-3(b)(1), unless the authorization is terminated or revoked.     Resp Syncytial Virus by PCR NEGATIVE NEGATIVE Final    Comment: (NOTE) Fact Sheet for Patients: bloggercourse.com  Fact Sheet for Healthcare Providers: seriousbroker.it  This test is not yet approved or cleared by the [place]  FDA and has been authorized for detection and/or diagnosis of SARS-CoV-2 by FDA under an Emergency Use Authorization (EUA). This EUA will remain in effect (meaning this test can be used) for the duration of the COVID-19 declaration under Section 564(b)(1) of the Act, 21 U.S.C. section 360bbb-3(b)(1), unless the authorization is terminated or revoked.  Performed at [facility], [address], [place], [place] [address]   Respiratory (~20 pathogens) panel by PCR     Status: None   Collection Time: 11/25/23 12:35 AM   Specimen: Nasopharyngeal Swab; Respiratory  Result Value Ref Range Status    Adenovirus NOT DETECTED NOT DETECTED Final   Coronavirus 229E NOT DETECTED NOT DETECTED Final    Comment: (NOTE) The Coronavirus on the Respiratory Panel, DOES NOT test for the novel  Coronavirus (2019 nCoV)    Coronavirus HKU1 NOT DETECTED NOT DETECTED Final   Coronavirus NL63 NOT DETECTED NOT DETECTED Final   Coronavirus OC43 NOT DETECTED NOT DETECTED Final   Metapneumovirus NOT DETECTED NOT DETECTED Final   Rhinovirus / Enterovirus NOT DETECTED NOT DETECTED Final   Influenza A NOT DETECTED NOT DETECTED Final   Influenza B NOT DETECTED NOT DETECTED Final   Parainfluenza Virus 1 NOT DETECTED NOT DETECTED Final   Parainfluenza Virus 2 NOT DETECTED NOT DETECTED Final   Parainfluenza Virus 3 NOT DETECTED NOT DETECTED Final   Parainfluenza Virus 4 NOT  DETECTED NOT DETECTED Final   Respiratory Syncytial Virus NOT DETECTED NOT DETECTED Final   Bordetella pertussis NOT DETECTED NOT DETECTED Final   Bordetella Parapertussis NOT DETECTED NOT DETECTED Final   Chlamydophila pneumoniae NOT DETECTED NOT DETECTED Final   Mycoplasma pneumoniae NOT DETECTED NOT DETECTED Final    Comment: Performed at [facility], [address], [place], [place] [address]    Labs: CBC: Recent Labs  Lab 11/02/24 1759 11/03/24 0554 11/04/24 0621  WBC 8.7 7.0 7.3  NEUTROABS 5.2  --   --   HGB 12.6* 11.4* 11.4*  HCT 38.5* 33.8* 33.3*  MCV 89.5 87.6 87.4  PLT 182 143* 134*   Basic Metabolic Panel: Recent Labs  Lab 11/02/24 1759 11/03/24 0554 11/04/24 0621  NA 135 133* 132*  K 4.2 4.2 4.0  CL 95* 99 99  CO2 24 25 24   GLUCOSE 216* 335* 172*  BUN 54* 54* 52*  CREATININE 3.46* 3.19* 2.72*  CALCIUM  9.8 8.9 9.1   Liver Function Tests: Recent Labs  Lab 11/03/24 0554  AST 21  ALT 23  ALKPHOS 55  BILITOT 1.1  PROT 6.4*  ALBUMIN 3.0*   CBG: Recent Labs  Lab 11/03/24 1120 11/03/24 1634 11/03/24 2021 11/04/24 0800 11/04/24 1122  GLUCAP 288* 157* 210* 273* 215*    Discharge time  spent: approximately 45 minutes spent on discharge counseling, evaluation of patient on day of discharge, and coordination of discharge planning with nursing, social work, pharmacy and case management  Signed: Lonni SHAUNNA Dalton, MD [facility] 11/04/2024

## 2024-11-04 NOTE — TOC Transition Note (Addendum)
 Transition of Care [facility]) - Discharge Note   Patient Details  Name: TYLEK BONEY MRN: [id number] Date of Birth: Feb 27, 1946  Transition of Care [facility]) CM/SW Contact:  Robynn Eileen Hoose, RN Phone Number: 11/04/2024, 10:11 AM   Clinical Narrative:   Patient is being discharged today. HH PT OT orders noted. Pt has used [facility] in the past, referral sent to Amy with [facility]. Awaiting response.  1020: Amy with [facility] able to accept patient for [facility] services. Contact information on AVS.          Patient Goals and CMS Choice            Discharge Placement                       Discharge Plan and Services Additional resources added to the After Visit Summary for                                       Social Drivers of Health (SDOH) Interventions SDOH Screenings   Food Insecurity: No Food Insecurity (11/03/2024)  Housing: Low Risk (11/03/2024)  Transportation Needs: No Transportation Needs (11/03/2024)  Utilities: Not At Risk (11/03/2024)  Depression (PHQ2-9): Low Risk (10/02/2024)  Social Connections: Socially Integrated (11/03/2024)  Tobacco Use: High Risk (11/03/2024)     Readmission Risk Interventions     No data to display

## 2024-11-05 ENCOUNTER — Telehealth: Payer: Self-pay | Admitting: *Deleted

## 2024-11-05 NOTE — Transitions of Care (Post Inpatient/ED Visit) (Signed)
 11/05/2024  Name: Jesse Hansen MRN: [id number] DOB: 30-Oct-1946  Today's TOC FU Call Status: Today's TOC FU Call Status:: Successful TOC FU Call Completed TOC FU Call Complete Date: 11/05/24  Patient's Name and Date of Birth confirmed. Unable to Confirm Identity (person answering phone reports she is patient's spouse: states [facility]; noted there is no verifiable [facility] DPR on file dating back to 2018 review; TOC call deferred accordingly)  Person answering phone reports she is patient's spouse: states [facility]; noted there is no verifiable [facility] DPR on file dating back to 2018 review; person on phone states that patient is not available to talk to you; and reports we know Dr. Norleen is retiring soon, so we don't know if we are going to get another doctor beside the [place] doctor or not; Full TOC call and assessments deferred accordingly   Transition Care Management Follow-up Telephone Call Date of Discharge: 11/04/24 Discharge Facility: [facility]) Type of Discharge: Inpatient Admission Primary Inpatient Discharge Diagnosis:: AKI in setting of CKD- III  Items Reviewed:    Medications Reviewed Today: Medications Reviewed Today     Reviewed by Olivine Hiers M, RN (Registered Nurse) on 11/05/24 at 1336  Med List Status: <None>   Medication Order Taking? Sig Documenting Provider Last Dose Status Informant  acetaminophen  (TYLENOL ) 500 MG tablet [id number] No Take 500-1,000 mg by mouth every 6 (six) hours as needed (for pain.). [provider] Unknown Active Spouse/Significant Other, Pharmacy Records  albuterol  (VENTOLIN  HFA) 108 (90 Base) MCG/ACT inhaler [id number] No Inhale 2 puffs into the lungs 2 (two) times daily. [provider] Past Week Active Spouse/Significant Other, Pharmacy Records  Alirocumab 150 MG/ML SOAJ [id number] No Inject 150 mg into the skin every 14 (fourteen) days. Praluent [provider] 10/22/2024 Active  Spouse/Significant Other, Pharmacy Records  ammonium lactate (LAC-HYDRIN) 12 % lotion [id number] No Apply 1 application. topically as needed for dry skin. [provider] Unknown Active Spouse/Significant Other, Pharmacy Records  apixaban  (ELIQUIS ) 2.5 MG TABS tablet [id number] No Take 2.5 mg by mouth 2 (two) times daily. [provider] 11/01/2024 Active Spouse/Significant Other, Pharmacy Records  clopidogrel  (PLAVIX ) 75 MG tablet [id number] No Take 75 mg by mouth daily. [provider] 11/02/2024 Morning Active Spouse/Significant Other, Pharmacy Records           Med Note (WHITE, DONETA GORMAN Schaumann Nov 24, 2023 11:06 PM)    Coenzyme Q10 (COQ10) 50 MG CAPS [id number] No Take 50 mg by mouth every morning. [provider] 11/02/2024 Morning Active Spouse/Significant Other, Pharmacy Records  furosemide  (LASIX ) 40 MG tablet [id number] No Take 1 tablet (40 mg total) by mouth daily. McDaniel, Jill D, NP 11/02/2024 Morning Active Spouse/Significant Other, Pharmacy Records           Med Note EFRAIM ALFREIDA CROME   Dju Nov 03, 2024  4:52 PM) Patient last dose was 80mg   insulin  aspart (NOVOLOG ) 100 UNIT/ML injection [id number] No Inject 10 Units into the skin 2 (two) times daily with a meal. [provider] 11/02/2024 Morning Active Spouse/Significant Other, Pharmacy Records  insulin  glargine-yfgn (SEMGLEE ) 100 UNIT/ML Pen [id number] No Inject 20 Units into the skin every evening. [provider] 11/02/2024 Morning Active Spouse/Significant Other, Pharmacy Records  ipratropium (ATROVENT ) 0.03 % nasal spray [id number] No Place 2 sprays into both nostrils 2 (two) times daily as needed for rhinitis. [provider] 11/02/2024 Morning Active Spouse/Significant Other, Pharmacy Records  LORazepam  (ATIVAN )  1 MG tablet [id number] No Take 2 tablets (2 mg total) by mouth at bedtime. [name] ORN, MD Past Week Active Spouse/Significant Other, Pharmacy Records  losartan   (COZAAR ) 25 MG tablet [id number] No Take 25 mg by mouth daily. [provider] 11/02/2024 Morning Active Spouse/Significant Other, Pharmacy Records  metoprolol  succinate (TOPROL -XL) 25 MG 24 hr tablet [id number] No Take 12.5 mg by mouth daily. [provider] 11/02/2024 Morning Active Spouse/Significant Other, Pharmacy Records  nitroGLYCERIN  (NITROSTAT ) 0.4 MG SL tablet [id number] No Place 0.4 mg under the tongue every 5 (five) minutes x 3 doses as needed for chest pain. [provider] 11/02/2024 Bedtime Active Spouse/Significant Other, Pharmacy Records           Med Note EFRAIM ALFREIDA CROME   Dju Nov 03, 2024  4:56 PM) Patient has taken this medication for heart burn as well as for chest pain  rosuvastatin  (CRESTOR ) 40 MG tablet [id number] No Take 40 mg by mouth every evening. [provider] 11/02/2024 Morning Active Spouse/Significant Other, Pharmacy Records  spironolactone  (ALDACTONE ) 25 MG tablet [id number] No Take 12.5 mg by mouth daily. TAKE ONE TABLET BY MOUTH DAILY FOR CARDIOMYOPATHY - STOP POTASSIUM SUPPLEMENT. FOLLOW UP RENAL PANEL 5-10 DAYS AFTER STARTING SPIRONOLACTONE . FOR CARDIOMYOPATHY - STOP POTASSIUM SUPPLEMENT. FOLLOW UP RENAL PANEL 5-10 DAYS AFTER STARTING SPIRONOLACTONE . [provider] 11/02/2024 Morning Active Spouse/Significant Other, Pharmacy Records  Tiotropium Bromide-Olodaterol 2.5-2.5 MCG/ACT AERS [id number] No Take by mouth. [provider] 11/02/2024 Active Spouse/Significant Other, Pharmacy Records  vitamin C (ASCORBIC ACID) 500 MG tablet [id number] No Take 500 mg by mouth every morning.  Patient not taking: Reported on 11/03/2024   [provider] Not Taking Active Spouse/Significant Other, Pharmacy Records            Home Care and Equipment/Supplies:    Functional Questionnaire:    Follow up appointments reviewed:    Pls call/ message for questions,  Madysyn Hanken Mckinney Graysin Luczynski, RN, BSN, CCRN  Alumnus RN Care Manager  Transitions of Care  [facility] [phone]: direct office
